# Patient Record
Sex: Female | Born: 1945
Health system: Southern US, Community
[De-identification: ages and names within clinical notes are randomized; demographics above are authoritative.]

## PROBLEM LIST (undated history)

## (undated) DIAGNOSIS — M1990 Unspecified osteoarthritis, unspecified site: Secondary | ICD-10-CM

## (undated) DIAGNOSIS — I10 Essential (primary) hypertension: Secondary | ICD-10-CM

## (undated) DIAGNOSIS — R112 Nausea with vomiting, unspecified: Secondary | ICD-10-CM

## (undated) DIAGNOSIS — I4891 Unspecified atrial fibrillation: Secondary | ICD-10-CM

## (undated) DIAGNOSIS — Z9889 Other specified postprocedural states: Secondary | ICD-10-CM

## (undated) HISTORY — DX: Essential (primary) hypertension: I10

## (undated) HISTORY — PX: JOINT REPLACEMENT: SHX530

---

## 2005-09-03 ENCOUNTER — Ambulatory Visit: Payer: Self-pay | Admitting: Family Medicine

## 2005-09-06 ENCOUNTER — Ambulatory Visit: Payer: Self-pay | Admitting: Family Medicine

## 2005-09-06 DIAGNOSIS — I10 Essential (primary) hypertension: Secondary | ICD-10-CM

## 2005-09-20 ENCOUNTER — Encounter: Admission: RE | Admit: 2005-09-20 | Discharge: 2005-09-20 | Payer: Self-pay | Admitting: Family Medicine

## 2005-10-07 ENCOUNTER — Ambulatory Visit: Payer: Self-pay | Admitting: Family Medicine

## 2005-11-08 ENCOUNTER — Ambulatory Visit: Payer: Self-pay | Admitting: Family Medicine

## 2006-02-05 ENCOUNTER — Ambulatory Visit: Payer: Self-pay | Admitting: Family Medicine

## 2006-05-28 ENCOUNTER — Ambulatory Visit: Payer: Self-pay | Admitting: Family Medicine

## 2006-05-28 LAB — CONVERTED CEMR LAB
ALT: 22 units/L (ref 0–40)
BUN: 12 mg/dL (ref 6–23)
Basophils Absolute: 0 10*3/uL (ref 0.0–0.1)
Basophils Relative: 1 % (ref 0.0–1.0)
Calcium: 9.3 mg/dL (ref 8.4–10.5)
Chloride: 107 meq/L (ref 96–112)
Cholesterol: 208 mg/dL (ref 0–200)
Creatinine, Ser: 0.8 mg/dL (ref 0.4–1.2)
HCT: 40.8 % (ref 36.0–46.0)
Hemoglobin: 14.1 g/dL (ref 12.0–15.0)
Lymphocytes Relative: 38.3 % (ref 12.0–46.0)
Monocytes Absolute: 0.4 10*3/uL (ref 0.2–0.7)
Monocytes Relative: 9.2 % (ref 3.0–11.0)
Neutro Abs: 1.9 10*3/uL (ref 1.4–7.7)
Neutrophils Relative %: 45.7 % (ref 43.0–77.0)
Platelets: 180 10*3/uL (ref 150–400)
RDW: 12.2 % (ref 11.5–14.6)
TSH: 0.97 microintl units/mL (ref 0.35–5.50)
Total Bilirubin: 1 mg/dL (ref 0.3–1.2)
Total CHOL/HDL Ratio: 4.7
Total Protein: 6.4 g/dL (ref 6.0–8.3)

## 2006-06-20 ENCOUNTER — Ambulatory Visit: Payer: Self-pay | Admitting: Family Medicine

## 2006-07-10 ENCOUNTER — Encounter: Payer: Self-pay | Admitting: Internal Medicine

## 2006-08-29 ENCOUNTER — Ambulatory Visit: Payer: Self-pay | Admitting: Internal Medicine

## 2006-08-29 DIAGNOSIS — L919 Hypertrophic disorder of the skin, unspecified: Secondary | ICD-10-CM

## 2006-08-29 DIAGNOSIS — L909 Atrophic disorder of skin, unspecified: Secondary | ICD-10-CM | POA: Insufficient documentation

## 2006-10-03 ENCOUNTER — Encounter: Admission: RE | Admit: 2006-10-03 | Discharge: 2006-10-03 | Payer: Self-pay | Admitting: Family Medicine

## 2006-12-05 ENCOUNTER — Ambulatory Visit: Payer: Self-pay | Admitting: Internal Medicine

## 2006-12-19 ENCOUNTER — Ambulatory Visit: Payer: Self-pay | Admitting: Internal Medicine

## 2006-12-19 ENCOUNTER — Encounter (INDEPENDENT_AMBULATORY_CARE_PROVIDER_SITE_OTHER): Payer: Self-pay | Admitting: Internal Medicine

## 2006-12-19 ENCOUNTER — Other Ambulatory Visit: Admission: RE | Admit: 2006-12-19 | Discharge: 2006-12-19 | Payer: Self-pay | Admitting: Family Medicine

## 2006-12-19 DIAGNOSIS — R7303 Prediabetes: Secondary | ICD-10-CM

## 2006-12-22 LAB — CONVERTED CEMR LAB
Direct LDL: 142.6 mg/dL
Glucose, Bld: 92 mg/dL (ref 70–99)
Total CHOL/HDL Ratio: 4.8
VLDL: 27 mg/dL (ref 0–40)

## 2006-12-25 ENCOUNTER — Ambulatory Visit: Payer: Self-pay | Admitting: Internal Medicine

## 2007-01-02 ENCOUNTER — Ambulatory Visit: Payer: Self-pay | Admitting: Family Medicine

## 2007-01-09 ENCOUNTER — Encounter (INDEPENDENT_AMBULATORY_CARE_PROVIDER_SITE_OTHER): Payer: Self-pay | Admitting: Internal Medicine

## 2007-01-09 ENCOUNTER — Ambulatory Visit: Payer: Self-pay | Admitting: Internal Medicine

## 2007-01-09 LAB — HM COLONOSCOPY: HM Colonoscopy: NORMAL

## 2007-06-19 ENCOUNTER — Ambulatory Visit: Payer: Self-pay | Admitting: Family Medicine

## 2007-06-26 LAB — CONVERTED CEMR LAB
CO2: 32 meq/L (ref 19–32)
Calcium: 9.3 mg/dL (ref 8.4–10.5)
Chloride: 107 meq/L (ref 96–112)
Creatinine, Ser: 0.9 mg/dL (ref 0.4–1.2)
GFR calc Af Amer: 82 mL/min
GFR calc non Af Amer: 68 mL/min
Glucose, Bld: 105 mg/dL — ABNORMAL HIGH (ref 70–99)
HDL: 44 mg/dL (ref >39.0)
LDL Cholesterol: 127 mg/dL — ABNORMAL HIGH (ref 0–99)
Potassium: 4.2 meq/L (ref 3.5–5.1)
Total CHOL/HDL Ratio: 4.5

## 2007-10-09 ENCOUNTER — Encounter: Admission: RE | Admit: 2007-10-09 | Discharge: 2007-10-09 | Payer: Self-pay | Admitting: Family Medicine

## 2007-10-13 ENCOUNTER — Encounter (INDEPENDENT_AMBULATORY_CARE_PROVIDER_SITE_OTHER): Payer: Self-pay | Admitting: *Deleted

## 2007-10-13 ENCOUNTER — Encounter (INDEPENDENT_AMBULATORY_CARE_PROVIDER_SITE_OTHER): Payer: Self-pay | Admitting: Internal Medicine

## 2007-12-23 ENCOUNTER — Ambulatory Visit: Payer: Self-pay | Admitting: Family Medicine

## 2008-02-08 ENCOUNTER — Ambulatory Visit: Payer: Self-pay | Admitting: Family Medicine

## 2008-02-08 DIAGNOSIS — E7800 Pure hypercholesterolemia, unspecified: Secondary | ICD-10-CM

## 2008-02-11 ENCOUNTER — Ambulatory Visit: Payer: Self-pay | Admitting: Family Medicine

## 2008-02-11 LAB — CONVERTED CEMR LAB
Cholesterol: 210 mg/dL (ref 0–200)
Direct LDL: 123.3 mg/dL
HDL: 42.9 mg/dL (ref >39.0)
VLDL: 25 mg/dL (ref 0–40)

## 2008-10-10 ENCOUNTER — Encounter: Admission: RE | Admit: 2008-10-10 | Discharge: 2008-10-10 | Payer: Self-pay | Admitting: Family Medicine

## 2008-10-11 ENCOUNTER — Encounter (INDEPENDENT_AMBULATORY_CARE_PROVIDER_SITE_OTHER): Payer: Self-pay | Admitting: Internal Medicine

## 2008-12-21 ENCOUNTER — Ambulatory Visit: Payer: Self-pay | Admitting: Family Medicine

## 2009-03-28 ENCOUNTER — Telehealth (INDEPENDENT_AMBULATORY_CARE_PROVIDER_SITE_OTHER): Payer: Self-pay | Admitting: Internal Medicine

## 2009-04-20 ENCOUNTER — Telehealth: Payer: Self-pay | Admitting: Family Medicine

## 2009-05-01 ENCOUNTER — Ambulatory Visit: Payer: Self-pay | Admitting: Family Medicine

## 2009-05-01 ENCOUNTER — Other Ambulatory Visit: Admission: RE | Admit: 2009-05-01 | Discharge: 2009-05-01 | Payer: Self-pay | Admitting: Family Medicine

## 2009-05-01 DIAGNOSIS — M25569 Pain in unspecified knee: Secondary | ICD-10-CM | POA: Insufficient documentation

## 2009-05-01 LAB — CONVERTED CEMR LAB
Alkaline Phosphatase: 64 units/L (ref 39–117)
BUN: 18 mg/dL (ref 6–23)
CO2: 30 meq/L (ref 19–32)
Calcium: 9.3 mg/dL (ref 8.4–10.5)
Cholesterol, target level: 200 mg/dL
Cholesterol: 188 mg/dL (ref 0–200)
Creatinine, Ser: 1 mg/dL (ref 0.4–1.2)
Glucose, Bld: 114 mg/dL — ABNORMAL HIGH (ref 70–99)
HDL goal, serum: 40 mg/dL
LDL Cholesterol: 106 mg/dL — ABNORMAL HIGH (ref 0–99)
Total Bilirubin: 0.9 mg/dL (ref 0.3–1.2)

## 2009-05-04 ENCOUNTER — Encounter: Payer: Self-pay | Admitting: Family Medicine

## 2009-05-04 ENCOUNTER — Encounter (INDEPENDENT_AMBULATORY_CARE_PROVIDER_SITE_OTHER): Payer: Self-pay | Admitting: *Deleted

## 2009-05-04 LAB — CONVERTED CEMR LAB: Pap Smear: NEGATIVE

## 2009-07-18 ENCOUNTER — Ambulatory Visit: Payer: Self-pay | Admitting: Family Medicine

## 2009-07-18 DIAGNOSIS — M5430 Sciatica, unspecified side: Secondary | ICD-10-CM | POA: Insufficient documentation

## 2009-10-13 ENCOUNTER — Encounter: Admission: RE | Admit: 2009-10-13 | Discharge: 2009-10-13 | Payer: Self-pay | Admitting: Family Medicine

## 2009-10-13 LAB — HM MAMMOGRAPHY

## 2009-12-13 ENCOUNTER — Ambulatory Visit: Payer: Self-pay | Admitting: Family Medicine

## 2010-04-24 NOTE — Progress Notes (Signed)
Summary: Altace 5mg  refill  Phone Note Refill Request Call back at [phone] Message from:  Patient on April 20, 2009 11:38 AM  Refills Requested: Medication #1:  ALTACE 5 MG CAPS Take 1 capsule by mouth once a day Pt request 10 pills until see Dr Dayton Martes on 05/01/09 at 8:30am. pt uses [facility].   Method Requested: Electronic Initial call taken by: Lewanda Rife LPN,  April 20, 2009 11:38 AM  Follow-up for Phone Call        Med was sent [facility] to [facility]. Patient notified as instructed by telephone. Lewanda Rife LPN  April 20, 2009 11:41 AM     Prescriptions: ALTACE 5 MG CAPS (RAMIPRIL) Take 1 capsule by mouth once a day  #10 x 0   Entered by:   Lewanda Rife LPN   Authorized by:   Shaune Leeks MD   Signed by:   Lewanda Rife LPN on 08/65/7846   Method used:   [facility]ically [facility]        [facility] (retail)       [address]       [place], [place]  [address]       Ph: [phone] or [phone]       Fax: [phone]   RxID:   [id number]

## 2010-04-24 NOTE — Consult Note (Signed)
Summary: [facility]   Imported By: Lanelle Bal 05/19/2009 08:59:30  _____________________________________________________________________  External Attachment:    Type:   Image     Comment:   External Document

## 2010-04-24 NOTE — Assessment & Plan Note (Signed)
Summary: CPX /RBH   Vital Signs:  Patient profile:   65 year old female Height:      63 inches Weight:      279 pounds BMI:     49.60 Temp:     98.1 degrees F oral Pulse rate:   92 / minute Pulse rhythm:   regular BP sitting:   166 / 82  (left arm) Cuff size:   large  Vitals Entered By: Delilah Shan CMA Duncan Dull) (May 01, 2009 8:18 AM)  Serial Vital Signs/Assessments:  Time      Position  BP       Pulse  Resp  Temp     By                     152/76                         Ruthe Mannan MD  CC: CPX, Lipid Management   History of Present Illness: 65 yo new to me here to establish care/CPX.  HTN- Has been controlled on Altace 5 mg adn HCTZ 25 mg.   Ran out of her Altace and a little nervous about today's appointment. Prior office visits show BPs in 1202-140s/70-80s.  No CP, SOB, LE edema.  Bilateral knee pain- has had knee pain with weight bearing for years. No redness, sometimes swells a little.  No injury, no LE weakness, never feels like her knees are going to give out on her.  Well woman- G3P3, no h/o abnormal pap smears. UTD on mammogram, colonoscopy. Never had zostavax. No personal or family h/o uterine, breast, or ovarian CA.  Lipid Management History:      Positive NCEP/ATP III risk factors include female age 64 years old or older and hypertension.  Negative NCEP/ATP III risk factors include non-tobacco-user status.     Current Medications (verified): 1)  Altace 5 Mg Caps (Ramipril) .... Take 1 Capsule By Mouth Once A Day 2)  Hydrochlorothiazide 25 Mg Tabs (Hydrochlorothiazide) .... 1/2 Tab Every Day 3)  Fish Oil 1000 Mg Caps (Omega-3 Fatty Acids) .... 2 Caps Every Day 4)  Calcium Carbonate-Vitamin D 600-400 Mg-Unit  Tabs (Calcium Carbonate-Vitamin D) .... Take 1 Tablet By Mouth Once A Day  Allergies (verified): No Known Drug Allergies  Review of Systems      See HPI General:  Denies weakness. CV:  Denies chest pain or discomfort, difficulty breathing at  night, difficulty breathing while lying down, leg cramps with exertion, lightheadness, near fainting, and palpitations. Resp:  Denies shortness of breath. GI:  Denies abdominal pain, bloody stools, and change in bowel habits. GU:  Denies discharge and dysuria. MS:  Complains of joint pain; denies joint redness, joint swelling, and muscle weakness. Derm:  Denies rash. Psych:  Denies anxiety and depression.  Physical Exam  General:  alert, well-developed, well-nourished, and well-hydrated.  morbidly obese  Eyes:  No corneal or conjunctival inflammation noted. EOMI. Perrla. Funduscopic exam benign, without hemorrhages, exudates or papilledema. Vision grossly normal. Ears:  External ear exam shows no significant lesions or deformities.  Otoscopic examination reveals clear canals, tympanic membranes are intact bilaterally without bulging, retraction, inflammation or discharge. Hearing is grossly normal bilaterally. Mouth:  Oral mucosa and oropharynx without lesions or exudates.  Teeth in good repair. Lungs:  normal respiratory effort, no intercostal retractions, no accessory muscle use, and normal breath sounds.   Heart:  normal rate, regular rhythm, and no murmur.  Genitalia:  Pelvic Exam:        External: normal female genitalia without lesions or masses        Vagina: normal without lesions or masses        Cervix: normal without lesions or masses        Adnexa: normal bimanual exam without masses or fullness        Uterus: normal by palpation        Pap smear: performed Msk:  no joint tenderness, no joint swelling, no joint warmth, and no redness over knees. Extremities:  no edema Skin:  turgor normal and color normal.   Psych:  normally interactive.     Impression & Recommendations:  Problem # 1:  WELL ADULT (ICD-V70.0) Reviewed preventive care protocols, scheduled due services, and updated immunizations Discussed nutrition, exercise, diet, and healthy lifestyle.  Zostavax, pap  today. BMET, FLP today.  Problem # 2:  SCREENING FOR MALIGNANT NEOPLASM OF THE CERVIX (ICD-V76.2)  Orders: Pap Smear, Thin Prep ( Collection of) (Z6109)  Problem # 3:  HYPERTENSION (ICD-401.9) Assessment: Deteriorated Likely due to running out of meds and anxiety from today's visit.  Will continue to have patient check at home and f/u with me. Her updated medication list for this problem includes:    Altace 5 Mg Caps (Ramipril) .Marland Kitchen... Take 1 capsule by mouth once a day    Hydrochlorothiazide 25 Mg Tabs (Hydrochlorothiazide) .Marland Kitchen... 1/2 tab every day  Orders: TLB-BMP (Basic Metabolic Panel-BMET) (80048-METABOL)  Problem # 4:  KNEE PAIN, BILATERAL (ICD-719.46) Assessment: New Has been ongoing for years, likely OA along with morbid obesity.  Will refer to ortho as I would not feel comfortable injecting her knees due to body habitius.  May need knee replacement, will continue to discuss weight loss. Orders: Orthopedic Referral (Ortho)  Complete Medication List: 1)  Altace 5 Mg Caps (Ramipril) .... Take 1 capsule by mouth once a day 2)  Hydrochlorothiazide 25 Mg Tabs (Hydrochlorothiazide) .... 1/2 tab every day 3)  Fish Oil 1000 Mg Caps (Omega-3 fatty acids) .... 2 caps every day 4)  Calcium Carbonate-vitamin D 600-400 Mg-unit Tabs (Calcium carbonate-vitamin d) .... Take 1 tablet by mouth once a day  Other Orders: Venipuncture ([address]) TLB-Hepatic/Liver Function Pnl (80076-HEPATIC) TLB-Lipid Panel (80061-LIPID)  Lipid Assessment/Plan:      Based on NCEP/ATP III, the patient's risk factor category is "2 or more risk factors and a calculated 10 year CAD risk of > 20%".  The patient's lipid goals are as follows: Total cholesterol goal is 200; LDL cholesterol goal is 130; HDL cholesterol goal is 40; Triglyceride goal is 150.    Patient Instructions: 1)  Nice to meet you, Ms Ebel. 2)  Please stop by and see Shirlee Limerick on your way out to set up your orthopedist referral. 3)  Come back to see  me after you see the orthopedist, we can discuss some weight loss strategies. Prescriptions: ALTACE 5 MG CAPS (RAMIPRIL) Take 1 capsule by mouth once a day  #30 x 11   Entered and Authorized by:   Ruthe Mannan MD   Signed by:   Ruthe Mannan MD on 05/01/2009   Method used:   [facility]        [facility] (retail)       [address]       [place], [place]  [address]       Ph: [phone] or [phone]       Fax: [phone]  RxID:   [id number] HYDROCHLOROTHIAZIDE 25 MG TABS (HYDROCHLOROTHIAZIDE) 1/2 tab every day  #30 x 11   Entered and Authorized by:   Ruthe Mannan MD   Signed by:   Ruthe Mannan MD on 05/01/2009   Method used:   [facility]        [id number]retail)       [address]       [place], [place]  [address]       Ph: [phone] or [phone]       Fax: [phone]   RxID:   [id number]   Current Allergies (reviewed today): No known allergies   Flex Sig Next Due:  Not Indicated Hemoccult Next Due:  Not Indicated   Appended Document: CPX /RBH   Immunizations Administered:  Zostavax # 1:    Vaccine Type: Zostavax    Site: Left arm    Mfr: Merck    Dose: 0.5 ml    Route: IM    Given by: Delilah Shan CMA (AAMA)    Exp. Date: 04/12/2010    Lot #: [id number]    VIS given: 01/04/05 given May 01, 2009.

## 2010-04-24 NOTE — Progress Notes (Signed)
Summary: Altace refill  Phone Note Refill Request Call back at [phone] Message from:  [facility] on March 28, 2009 9:18 AM  Refills Requested: Medication #1:  ALTACE 5 MG CAPS Take 1 capsule by mouth once a day [facility] request refill for Altace 5mg , pt has not been seen since 02/11/08. Pt cancelled appts on 04/03/08 and 04/28/08. Unable to reach pt by phone. No work or cell #. No answer at home #. Last refill note was attached to rx to call for appt before further refills could be given. Wanted to check with you to see if give one more refill with note to call for appt or deny refill. Please advise.    Method Requested: Electronic Initial call taken by: Lewanda Rife LPN,  March 28, 2009 9:20 AM  Follow-up for Phone Call        will refill with 15 pills only--needs to be seen during this period    Billie-Lynn Tyler Deis FNP  March 28, 2009 9:46 AM   [facility] as instructed. Unable to reach pt by phone. Left message with pharmacist that pt needs to be seen before the #15 pills are gone. Follow-up by: Lewanda Rife LPN,  March 28, 2009 10:37 AM    Prescriptions: ALTACE 5 MG CAPS (RAMIPRIL) Take 1 capsule by mouth once a day  #15 x 0   Entered and Authorized by:   Gildardo Griffes FNP   Signed by:   Gildardo Griffes FNP on 03/28/2009   Method used:   Telephoned to ...         RxID:   [id number]

## 2010-04-24 NOTE — Assessment & Plan Note (Signed)
Summary: FLU SHOT/CLE   Nurse Visit   Allergies: No Known Drug Allergies  Immunizations Administered:  Influenza Vaccine # 1:    Vaccine Type: Fluvax 3+    Site: left deltoid    Mfr: GlaxoSmithKline    Dose: 0.5 ml    Route: IM    Given by: DeShannon Smith CMA (AAMA)    Exp. Date: 09/22/2010    Lot #: [id number]    VIS given: 10/17/09 version given December 13, 2009.  Flu Vaccine Consent Questions:    Do you have a history of severe allergic reactions to this vaccine? no    Any prior history of allergic reactions to egg and/or gelatin? no    Do you have a sensitivity to the preservative Thimersol? no    Do you have a past history of Guillan-Barre Syndrome? no    Do you currently have an acute febrile illness? no    Have you ever had a severe reaction to latex? no    Vaccine information given and explained to patient? yes    Are you currently pregnant? no  Orders Added: 1)  Flu Vaccine 3yrs + [90658] 2)  Admin 1st Vaccine [90471] 

## 2010-04-24 NOTE — Letter (Signed)
Summary: Results Follow up Letter  [place] [facility]  [address] [place], [place] [address]   Phone: [phone]  Fax: [phone]    05/04/2009 MRN: [id number]    EMILYROSE DARRAH [address] [place], [place]  [address]    Dear Ms. Warshawsky,  The following are the results of your recent test(s):  Test         Result    Pap Smear:        Normal ___X__  Not Normal _____ Comments: ______________________________________________________ Cholesterol: LDL(Bad cholesterol):         Your goal is less than:         HDL (Good cholesterol):       Your goal is more than: Comments:  ______________________________________________________ Mammogram:        Normal _____  Not Normal _____ Comments:  ___________________________________________________________________ Hemoccult:        Normal _____  Not normal _______ Comments:    _____________________________________________________________________ Other Tests:    We routinely do not discuss normal results over the telephone.  If you desire a copy of the results, or you have any questions about this information we can discuss them at your next office visit.   Sincerely,    Ruthe Mannan,  M.D.  TA:lsf

## 2010-04-24 NOTE — Assessment & Plan Note (Signed)
Summary: PULLED MUSCLE IN BACK OF L LEG / LFW   Vital Signs:  Patient profile:   65 year old female Height:      63 inches Weight:      315.25 pounds BMI:     56.05 Temp:     98.3 degrees F oral Pulse rate:   84 / minute Pulse rhythm:   regular BP sitting:   172 / 84  (left arm) Cuff size:   large  Vitals Entered By: Delilah Shan CMA Duncan Dull) (July 18, 2009 11:30 AM) CC: Pulled muscle in back of leg   History of Present Illness: 65 yo with left lower back and leg pain x 1 week. Did not remember injuring her back in anyway. Pain starts left lower back, radiates to her knee. Describes it as a shooting, achy pain. No LE weakness.  Pain is the worst when she stands from a sitting position. No urinary symptoms or fevers.  Current Medications (verified): 1)  Altace 5 Mg Caps (Ramipril) .... Take 1 Capsule By Mouth Once A Day 2)  Hydrochlorothiazide 25 Mg Tabs (Hydrochlorothiazide) .... 1/2 Tab Every Day 3)  Fish Oil 1000 Mg Caps (Omega-3 Fatty Acids) .... 2 Caps Every Day 4)  Calcium Carbonate-Vitamin D 600-400 Mg-Unit  Tabs (Calcium Carbonate-Vitamin D) .... Take 1 Tablet By Mouth Once A Day 5)  Cyclobenzaprine Hcl 10 Mg  Tabs (Cyclobenzaprine Hcl) .Marland Kitchen.. 1 By Mouth 2 Times Daily As Needed For Back Pain 6)  Ibuprofen 800 Mg Tabs (Ibuprofen) .Marland Kitchen.. 1 Tab By Mouth Three Times A Day As Needed Pain  Allergies (verified): No Known Drug Allergies  Review of Systems      See HPI General:  Denies chills and fever. GI:  Denies abdominal pain. GU:  Denies dysuria, incontinence, urinary frequency, and urinary hesitancy. MS:  Complains of low back pain; denies muscle weakness.  Physical Exam  General:  alert, well-developed, well-nourished, and well-hydrated.  morbidly obese  Msk:  Left SLR pos. pos fabers. Exam limited due to body habitus, normal LE strength and reflexes. Psych:  normally interactive.     Impression & Recommendations:  Problem # 1:  SCIATICA  (ICD-724.3) Assessment New Will try flexeril and Ibuprofen. Pt to follow up in 6 weeks, sooner if symptoms worsen. Her updated medication list for this problem includes:    Cyclobenzaprine Hcl 10 Mg Tabs (Cyclobenzaprine hcl) .Marland Kitchen... 1 by mouth 2 times daily as needed for back pain    Ibuprofen 800 Mg Tabs (Ibuprofen) .Marland Kitchen... 1 tab by mouth three times a day as needed pain  Complete Medication List: 1)  Altace 5 Mg Caps (Ramipril) .... Take 1 capsule by mouth once a day 2)  Hydrochlorothiazide 25 Mg Tabs (Hydrochlorothiazide) .... 1/2 tab every day 3)  Fish Oil 1000 Mg Caps (Omega-3 fatty acids) .... 2 caps every day 4)  Calcium Carbonate-vitamin D 600-400 Mg-unit Tabs (Calcium carbonate-vitamin d) .... Take 1 tablet by mouth once a day 5)  Cyclobenzaprine Hcl 10 Mg Tabs (Cyclobenzaprine hcl) .Marland Kitchen.. 1 by mouth 2 times daily as needed for back pain 6)  Ibuprofen 800 Mg Tabs (Ibuprofen) .Marland Kitchen.. 1 tab by mouth three times a day as needed pain  Patient Instructions: 1)  Most patients (90%) with low back pain will improve with time ( 2-6 weeks). Keep active but avoid activities that are painful. Apply moist heat and/or ice to lower back several times a day.  2)  Flexeril and Ibuprofen as needed. Prescriptions: IBUPROFEN 800 MG TABS (  IBUPROFEN) 1 tab by mouth three times a day as needed pain  #90 x 0   Entered and Authorized by:   Ruthe Mannan MD   Signed by:   Ruthe Mannan MD on 07/18/2009   Method used:   [facility]        [facility]       [address]       [place], [place]  [address]       Ph: [phone] or [phone]       Fax: [phone]   RxID:   [id number] CYCLOBENZAPRINE HCL 10 MG  TABS (CYCLOBENZAPRINE HCL) 1 by mouth 2 times daily as needed for back pain  #60 x 0   Entered and Authorized by:   Ruthe Mannan MD   Signed by:   Ruthe Mannan MD on 07/18/2009   Method used:   [facility]        [facility] (retail)       [address]       [place], [place]  [address]       Ph: [phone] or [phone]       Fax: [phone]   RxID:   [id number]   Current Allergies (reviewed today): No known allergies

## 2010-06-26 ENCOUNTER — Other Ambulatory Visit: Payer: Self-pay | Admitting: Family Medicine

## 2010-08-18 ENCOUNTER — Encounter: Payer: Self-pay | Admitting: Family Medicine

## 2010-08-21 ENCOUNTER — Ambulatory Visit (INDEPENDENT_AMBULATORY_CARE_PROVIDER_SITE_OTHER): Payer: BC Managed Care – PPO | Admitting: Family Medicine

## 2010-08-21 ENCOUNTER — Encounter: Payer: Self-pay | Admitting: Family Medicine

## 2010-08-21 VITALS — BP 160/100 | HR 90 | Temp 98.5°F | Ht 62.0 in | Wt 256.8 lb

## 2010-08-21 DIAGNOSIS — R7309 Other abnormal glucose: Secondary | ICD-10-CM

## 2010-08-21 DIAGNOSIS — E7800 Pure hypercholesterolemia, unspecified: Secondary | ICD-10-CM

## 2010-08-21 DIAGNOSIS — Z0000 Encounter for general adult medical examination without abnormal findings: Secondary | ICD-10-CM | POA: Insufficient documentation

## 2010-08-21 DIAGNOSIS — I10 Essential (primary) hypertension: Secondary | ICD-10-CM

## 2010-08-21 LAB — BASIC METABOLIC PANEL
BUN: 17 mg/dL (ref 6–23)
GFR: 65.29 mL/min (ref >60.00)

## 2010-08-21 LAB — LIPID PANEL
Cholesterol: 200 mg/dL (ref 0–200)
LDL Cholesterol: 114 mg/dL — ABNORMAL HIGH (ref 0–99)
Triglycerides: 173 mg/dL — ABNORMAL HIGH (ref 0.0–149.0)

## 2010-08-21 LAB — HEMOGLOBIN A1C: Hgb A1c MFr Bld: 5.7 % (ref 4.6–6.5)

## 2010-08-21 MED ORDER — RAMIPRIL 5 MG PO CAPS: 5.0000 mg | ORAL_CAPSULE | Freq: Every day | ORAL | Status: DC

## 2010-08-21 NOTE — Assessment & Plan Note (Signed)
Deteriorated. Will refill Ramipril. Check BMET today.

## 2010-08-21 NOTE — Progress Notes (Signed)
65 yo new to me here for CPX.  HTN- Has been controlled on Altace 5 mg adn HCTZ 25 mg.   Ran out of her Altace and a little nervous about today's appointment.  No CP, SOB, LE edema.   Bilateral knee pain- has had knee pain with weight bearing for years. Has been seeing ortho, Dr. August Saucer. Getting knee injections which have been helping a great deal.    Well woman- G3P3, no h/o abnormal pap smears. UTD on mammogram, colonoscopy. Received Zostavax here last year.   No personal or family h/o uterine, breast, or ovarian CA.  The PMH, PSH, Social History, Family History, Medications, and allergies have been reviewed in [facility], and have been updated if relevant.   Review of Systems       See HPI General:  Denies weakness. CV:  Denies chest pain or discomfort, difficulty breathing at night, difficulty breathing while lying down, leg cramps with exertion, lightheadness, near fainting, and palpitations. Resp:  Denies shortness of breath. GI:  Denies abdominal pain, bloody stools, and change in bowel habits. GU:  Denies discharge and dysuria. MS:  Complains of joint pain; denies joint redness, joint swelling, and muscle weakness. Derm:  Denies rash. Psych:  Denies anxiety and depression.  Physical Exam BP 160/100  Pulse 90  Temp(Src) 98.5 F (36.9 C) (Oral)  Ht 5\' 2"  (1.575 m)  Wt 256 lb 12.8 oz (116.484 kg)  BMI 46.97 kg/m2  SpO2 97%  General:  alert, well-developed, well-nourished, and well-hydrated.  morbidly obese  Eyes:  No corneal or conjunctival inflammation noted. EOMI. Perrla. Funduscopic exam benign, without hemorrhages, exudates or papilledema. Vision grossly normal. Ears:  External ear exam shows no significant lesions or deformities.  Otoscopic examination reveals clear canals, tympanic membranes are intact bilaterally without bulging, retraction, inflammation or discharge. Hearing is grossly normal bilaterally. Mouth:  Oral mucosa and oropharynx without lesions or exudates.   Teeth in good repair. Lungs:  normal respiratory effort, no intercostal retractions, no accessory muscle use, and normal breath sounds.   Heart:  normal rate, regular rhythm, and no murmur.   Msk:  no joint tenderness, no joint swelling, no joint warmth, and no redness over knees. Extremities:  no edema Skin:  turgor normal and color normal.   Psych:  normally interactive.

## 2010-08-21 NOTE — Assessment & Plan Note (Signed)
Check FLP today. 

## 2010-08-21 NOTE — Assessment & Plan Note (Signed)
Reviewed preventive care protocols, scheduled due services, and updated immunizations Discussed nutrition, exercise, diet, and healthy lifestyle.  

## 2010-08-24 ENCOUNTER — Other Ambulatory Visit: Payer: Self-pay | Admitting: *Deleted

## 2010-08-24 MED ORDER — HYDROCHLOROTHIAZIDE 25 MG PO TABS: 25.0000 mg | ORAL_TABLET | Freq: Every day | ORAL | Status: DC

## 2010-08-28 ENCOUNTER — Encounter: Payer: Self-pay | Admitting: *Deleted

## 2010-08-28 ENCOUNTER — Other Ambulatory Visit: Payer: Self-pay | Admitting: Family Medicine

## 2010-08-28 ENCOUNTER — Other Ambulatory Visit: Payer: BC Managed Care – PPO

## 2010-08-28 DIAGNOSIS — Z1211 Encounter for screening for malignant neoplasm of colon: Secondary | ICD-10-CM

## 2010-08-28 LAB — FECAL OCCULT BLOOD, IMMUNOCHEMICAL: Fecal Occult Bld: NEGATIVE

## 2010-09-11 ENCOUNTER — Other Ambulatory Visit: Payer: Self-pay | Admitting: Family Medicine

## 2010-09-11 DIAGNOSIS — Z1231 Encounter for screening mammogram for malignant neoplasm of breast: Secondary | ICD-10-CM

## 2010-10-15 ENCOUNTER — Ambulatory Visit
Admission: RE | Admit: 2010-10-15 | Discharge: 2010-10-15 | Disposition: A | Discharge status: Home / Self Care | Payer: Federal, State, Local not specified - PPO | Source: Ambulatory Visit | Attending: Family Medicine | Admitting: Family Medicine

## 2010-10-15 DIAGNOSIS — Z1231 Encounter for screening mammogram for malignant neoplasm of breast: Secondary | ICD-10-CM

## 2010-10-16 ENCOUNTER — Encounter: Payer: Self-pay | Admitting: *Deleted

## 2010-12-19 ENCOUNTER — Ambulatory Visit (INDEPENDENT_AMBULATORY_CARE_PROVIDER_SITE_OTHER): Payer: Federal, State, Local not specified - PPO

## 2010-12-19 DIAGNOSIS — Z23 Encounter for immunization: Secondary | ICD-10-CM

## 2011-03-13 ENCOUNTER — Other Ambulatory Visit: Payer: Self-pay | Admitting: Family Medicine

## 2011-05-16 ENCOUNTER — Other Ambulatory Visit: Payer: Self-pay | Admitting: Family Medicine

## 2011-07-12 ENCOUNTER — Other Ambulatory Visit: Payer: Self-pay | Admitting: Internal Medicine

## 2011-07-12 ENCOUNTER — Other Ambulatory Visit: Payer: Self-pay | Admitting: *Deleted

## 2011-07-12 MED ORDER — HYDROCHLOROTHIAZIDE 25 MG PO TABS: 25.0000 mg | ORAL_TABLET | Freq: Every day | ORAL | Status: DC

## 2011-07-12 MED ORDER — RAMIPRIL 5 MG PO CAPS: 5.0000 mg | ORAL_CAPSULE | Freq: Every day | ORAL | Status: DC

## 2011-09-17 ENCOUNTER — Other Ambulatory Visit: Payer: Self-pay | Admitting: Family Medicine

## 2011-09-17 DIAGNOSIS — Z1231 Encounter for screening mammogram for malignant neoplasm of breast: Secondary | ICD-10-CM

## 2011-10-18 ENCOUNTER — Ambulatory Visit: Payer: Federal, State, Local not specified - PPO

## 2011-11-12 ENCOUNTER — Encounter: Payer: Self-pay | Admitting: Internal Medicine

## 2011-12-18 ENCOUNTER — Ambulatory Visit (INDEPENDENT_AMBULATORY_CARE_PROVIDER_SITE_OTHER): Payer: Federal, State, Local not specified - PPO

## 2011-12-18 DIAGNOSIS — Z23 Encounter for immunization: Secondary | ICD-10-CM

## 2012-01-20 ENCOUNTER — Other Ambulatory Visit: Payer: Self-pay

## 2012-01-20 MED ORDER — HYDROCHLOROTHIAZIDE 25 MG PO TABS: 25.0000 mg | ORAL_TABLET | Freq: Every day | ORAL | Status: DC

## 2012-01-20 NOTE — Telephone Encounter (Signed)
Ok to refill one month. Unfortunately needs to be seen for further refills.

## 2012-01-20 NOTE — Telephone Encounter (Signed)
Pt request refill HCTZ to [facility]; advised pt last seen 08/21/10 and pt will call back in 2 weeks to schedule appt; pt presently caring for her mother. Pt is out of med.Please advise.

## 2012-01-29 DIAGNOSIS — M1710 Unilateral primary osteoarthritis, unspecified knee: Secondary | ICD-10-CM | POA: Diagnosis not present

## 2012-02-05 ENCOUNTER — Ambulatory Visit (INDEPENDENT_AMBULATORY_CARE_PROVIDER_SITE_OTHER): Payer: Federal, State, Local not specified - PPO | Admitting: Family Medicine

## 2012-02-05 ENCOUNTER — Encounter: Payer: Self-pay | Admitting: Family Medicine

## 2012-02-05 VITALS — BP 140/70 | HR 80 | Temp 97.6°F | Wt 261.0 lb

## 2012-02-05 DIAGNOSIS — M25569 Pain in unspecified knee: Secondary | ICD-10-CM

## 2012-02-05 DIAGNOSIS — G589 Mononeuropathy, unspecified: Secondary | ICD-10-CM

## 2012-02-05 DIAGNOSIS — G629 Polyneuropathy, unspecified: Secondary | ICD-10-CM

## 2012-02-05 LAB — CBC WITH DIFFERENTIAL/PLATELET
Basophils Absolute: 0.1 10*3/uL (ref 0.0–0.1)
Basophils Relative: 0.8 % (ref 0.0–3.0)
Eosinophils Absolute: 0.2 10*3/uL (ref 0.0–0.7)
HCT: 46 % (ref 36.0–46.0)
Hemoglobin: 15.2 g/dL — ABNORMAL HIGH (ref 12.0–15.0)
MCHC: 33 g/dL (ref 30.0–36.0)
MCV: 84.3 fl (ref 78.0–100.0)
Monocytes Absolute: 0.8 10*3/uL (ref 0.1–1.0)
Monocytes Relative: 10.3 % (ref 3.0–12.0)
RDW: 13.8 % (ref 11.5–14.6)

## 2012-02-05 LAB — HEMOGLOBIN A1C: Hgb A1c MFr Bld: 5.8 % (ref 4.6–6.5)

## 2012-02-05 MED ORDER — GABAPENTIN 100 MG PO CAPS: ORAL_CAPSULE | ORAL | Status: DC

## 2012-02-05 NOTE — Progress Notes (Signed)
66 yo here for:   Bilateral knee pain- has had knee pain with weight bearing for years. Has been seeing ortho, Dr. August Saucer and receiving cortisone injections- just received them this week.   She does have end stage OA and is planning on having both knees replaced in the near future.  This has been helping but now she is having some burning and tingling in her feet.  She is not a diabetic. Lab Results  Component Value Date   HGBA1C 5.7 08/21/2010     Patient Active Problem List  Diagnosis  . PURE HYPERCHOLESTEROLEMIA  . OBESITY, MORBID  . HYPERTENSION  . SKIN TAG  . KNEE PAIN, BILATERAL  . SCIATICA  . HYPERGLYCEMIA  . Routine general medical examination at a health care facility   Past Medical History  Diagnosis Date  . Hypertension    No past surgical history on file. History  Substance Use Topics  . Smoking status: Never Smoker   . Smokeless tobacco: Not on file  . Alcohol Use: Not on file   No family history on file. No Known Allergies Current Outpatient Prescriptions on File Prior to Visit  Medication Sig Dispense Refill  . Calcium Carbonate-Vitamin D (CALCIUM 600+D) 600-400 MG-UNIT per tablet Take 1 tablet by mouth daily.        . cyclobenzaprine (FLEXERIL) 10 MG tablet Take one by mouth 2 times daily as needed for back pain       . hydrochlorothiazide (HYDRODIURIL) 25 MG tablet Take 1 tablet (25 mg total) by mouth daily.  30 tablet  0  . ibuprofen (ADVIL,MOTRIN) 800 MG tablet Take one tablet by mouth three times a day as needed for pain       . Omega-3 Fatty Acids (FISH OIL) 1000 MG CAPS Take 2 capsules daily       . ramipril (ALTACE) 5 MG capsule Take 1 capsule (5 mg total) by mouth daily.  90 capsule  2     The PMH, PSH, Social History, Family History, Medications, and allergies have been reviewed in [facility], and have been updated if relevant.   Review of Systems       See HPI General:  Denies weakness. CV:  Denies chest pain or discomfort, difficulty breathing  at night, difficulty breathing while lying down, leg cramps with exertion, lightheadness, near fainting, and palpitations. Resp:  Denies shortness of breath. GI:  Denies abdominal pain, bloody stools, and change in bowel habits. GU:  Denies discharge and dysuria. MS:  Complains of joint pain; denies joint redness, joint swelling, and muscle weakness. Derm:  Denies rash. Psych:  Denies anxiety and depression.  Physical Exam BP 140/70  Pulse 80  Temp 97.6 F (36.4 C)  Wt 261 lb (118.389 kg)  General:  alert, well-developed, well-nourished, and well-hydrated.  morbidly obese  Eyes:  No corneal or conjunctival inflammation noted. EOMI. Perrla. Funduscopic exam benign, without hemorrhages, exudates or papilledema. Vision grossly normal. Ears:  External ear exam shows no significant lesions or deformities.  Otoscopic examination reveals clear canals, tympanic membranes are intact bilaterally without bulging, retraction, inflammation or discharge. Hearing is grossly normal bilaterally. Mouth:  Oral mucosa and oropharynx without lesions or exudates.  Teeth in good repair. Lungs:  normal respiratory effort, no intercostal retractions, no accessory muscle use, and normal breath sounds.   Heart:  normal rate, regular rhythm, and no murmur.   Msk:  no joint tenderness, no joint swelling, no joint warmth, and no redness over knees. Extremities:  no edema Skin:  turgor normal and color normal.   Psych:  normally interactive.   Neuro:  Reflexes normal and symmetrical throughout  foot exam: Normal inspection No skin breakdown No calluses  Normal DP pulses Normal sensation to light touch and monofilament Nails normal   Assessment and Plan: 1. Neuropathy  New- has not had labs checked in over a year.  Will check labs today- including a1c, B12, CBC.  If all normal, likely related to her OA.  Start gabapentin- see pt instructions for details. Hemoglobin A1c, Vitamin B12, CBC with Differential  2.  KNEE PAIN, BILATERAL  Deteriorated.  End stage- awaiting bilateral Knee replacements.

## 2012-02-05 NOTE — Patient Instructions (Addendum)
Good to see you. We will call you with your lab work.  We are starting Gabapentin as directed.  Please call me in a month with an update.

## 2012-02-13 ENCOUNTER — Other Ambulatory Visit: Payer: Self-pay | Admitting: Family Medicine

## 2012-02-13 ENCOUNTER — Ambulatory Visit: Payer: Federal, State, Local not specified - PPO | Admitting: Family Medicine

## 2012-04-08 ENCOUNTER — Other Ambulatory Visit: Payer: Self-pay | Admitting: Internal Medicine

## 2012-05-27 ENCOUNTER — Other Ambulatory Visit: Payer: Self-pay | Admitting: Family Medicine

## 2012-06-03 DIAGNOSIS — M1710 Unilateral primary osteoarthritis, unspecified knee: Secondary | ICD-10-CM | POA: Diagnosis not present

## 2012-06-08 ENCOUNTER — Other Ambulatory Visit: Payer: Self-pay | Admitting: Orthopedic Surgery

## 2012-06-29 ENCOUNTER — Encounter (HOSPITAL_COMMUNITY): Payer: Self-pay

## 2012-06-29 ENCOUNTER — Encounter (HOSPITAL_COMMUNITY)
Admission: RE | Admit: 2012-06-29 | Discharge: 2012-06-29 | Disposition: A | Discharge status: Home / Self Care | Payer: Medicare Other | Source: Ambulatory Visit | Attending: Orthopedic Surgery | Admitting: Orthopedic Surgery

## 2012-06-29 DIAGNOSIS — Z01810 Encounter for preprocedural cardiovascular examination: Secondary | ICD-10-CM | POA: Insufficient documentation

## 2012-06-29 DIAGNOSIS — Z01818 Encounter for other preprocedural examination: Secondary | ICD-10-CM | POA: Diagnosis not present

## 2012-06-29 DIAGNOSIS — Z01812 Encounter for preprocedural laboratory examination: Secondary | ICD-10-CM | POA: Insufficient documentation

## 2012-06-29 DIAGNOSIS — I10 Essential (primary) hypertension: Secondary | ICD-10-CM | POA: Diagnosis not present

## 2012-06-29 HISTORY — DX: Unspecified osteoarthritis, unspecified site: M19.90

## 2012-06-29 LAB — BASIC METABOLIC PANEL
BUN: 23 mg/dL (ref 6–23)
CO2: 25 mEq/L (ref 19–32)
Calcium: 9.7 mg/dL (ref 8.4–10.5)
GFR calc non Af Amer: 72 mL/min — ABNORMAL LOW (ref >90)
Glucose, Bld: 110 mg/dL — ABNORMAL HIGH (ref 70–99)
Potassium: 3.3 mEq/L — ABNORMAL LOW (ref 3.5–5.1)

## 2012-06-29 LAB — URINALYSIS, ROUTINE W REFLEX MICROSCOPIC
Bilirubin Urine: NEGATIVE
Ketones, ur: NEGATIVE mg/dL
Nitrite: NEGATIVE
Protein, ur: NEGATIVE mg/dL

## 2012-06-29 LAB — CBC
Hemoglobin: 15.2 g/dL — ABNORMAL HIGH (ref 12.0–15.0)
MCH: 27.6 pg (ref 26.0–34.0)
MCHC: 34.9 g/dL (ref 30.0–36.0)
MCV: 79.1 fL (ref 78.0–100.0)
RBC: 5.51 MIL/uL — ABNORMAL HIGH (ref 3.87–5.11)

## 2012-06-29 LAB — TYPE AND SCREEN
ABO/RH(D): O POS
Antibody Screen: NEGATIVE

## 2012-06-29 LAB — URINE MICROSCOPIC-ADD ON

## 2012-06-29 LAB — PROTIME-INR: Prothrombin Time: 13.2 seconds (ref 11.6–15.2)

## 2012-06-29 NOTE — Pre-Procedure Instructions (Signed)
[name]  06/29/2012   Your procedure is scheduled on:  07/07/12  Report to [facility] at 530 AM.  Call this number if you have problems the morning of surgery: [phone]   Remember:   Do not eat food or drink liquids after midnight.   Take these medicines the morning of surgery with A SIP OF WATER: none   Do not wear jewelry, make-up or nail polish.  Do not wear lotions, powders, or perfumes. You may wear deodorant.  Do not shave 48 hours prior to surgery. Men may shave face and neck.  Do not bring valuables to the hospital.  Contacts, dentures or bridgework may not be worn into surgery.  Leave suitcase in the car. After surgery it may be brought to your room.  For patients admitted to the hospital, checkout time is 11:00 AM the day of  discharge.   Patients discharged the day of surgery will not be allowed to drive  home.  Name and phone number of your driver: family  Special Instructions: Shower using CHG 2 nights before surgery and the night before surgery.  If you shower the day of surgery use CHG.  Use special wash - you have one bottle of CHG for all showers.  You should use approximately 1/3 of the bottle for each shower.   Please read over the following fact sheets that you were given: Pain Booklet, Coughing and Deep Breathing, Blood Transfusion Information, MRSA Information and Surgical Site Infection Prevention

## 2012-06-29 NOTE — Progress Notes (Signed)
Will give lab results to [facility] for review.  K 3.3

## 2012-06-30 NOTE — Progress Notes (Signed)
Anesthesia chart review: Patient is a 67 year old female scheduled for right TKA by Dr. August Saucer on 07/07/2012. History includes nonsmoker, hypertension, arthritis, morbid obesity.  PCP is Dr. Ruthe Mannan who is aware of plans for future bilateral (staged) TKA.  Preoperative labs acceptable from an anesthesia standpoint. Copy of lab results faxed to Dr. Diamantina Providence office.   CXR on 06/29/12 showed no evidence of acute cardiopulmonary disease.  EKG on 06/29/12 showed SR with PACs, pulmonary disease pattern, LAFB, poor r wave progression.  Currently, there are no comparison EKGs available.  No CV symptoms were documented at her PAT visit.  She is morbidly obese, but no documented history of CAD/MI/CHF, smoking, or DM.  Her PCP is aware of plans for upcoming TKR and did not order any additional testing.  She will be evaluated by her assigned anesthesiologist on the day of surgery, but if she remains asymptomatic from a CV standpoint then would anticipate she could proceed as planned.  Velna Ochs [facility]/Anesthesiology Phone [phone] 06/30/2012 4:35 PM

## 2012-07-06 ENCOUNTER — Other Ambulatory Visit: Payer: Self-pay | Admitting: Family Medicine

## 2012-07-06 MED ORDER — DEXTROSE 5 % IV SOLN
3.0000 g | INTRAVENOUS | Status: AC
  Administered 2012-07-07: 3 g via INTRAVENOUS
  Filled 2012-07-06: qty 3000

## 2012-07-06 NOTE — H&P (Signed)
TOTAL KNEE ADMISSION H&P  Patient is being admitted for right total knee arthroplasty.  Subjective:  Chief Complaint:right knee pain.  HPI: [name], 67 y.o. female, has a history of pain and functional disability in the right knee due to arthritis and has failed non-surgical conservative treatments for greater than 12 weeks to includeNSAID's and/or analgesics, corticosteriod injections, viscosupplementation injections, weight reduction as appropriate and activity modification.  Onset of symptoms was gradual, starting 10 years ago with gradually worsening course since that time. The patient noted no past surgery on the bilaterally knee(s).  Patient currently rates pain in the right knee(s) at 7 out of 10 with activity. Patient has night pain, worsening of pain with activity and weight bearing, pain that interferes with activities of daily living, pain with passive range of motion, crepitus and joint swelling.  Patient has evidence of subchondral cysts, subchondral sclerosis, periarticular osteophytes, joint subluxation and joint space narrowing by imaging studies. This patient has had increase in pain despite all non op measures. There is no active infection.  Patient Active Problem List   Diagnosis Date Noted  . Routine general medical examination at a health care facility 08/21/2010  . SCIATICA 07/18/2009  . KNEE PAIN, BILATERAL 05/01/2009  . PURE HYPERCHOLESTEROLEMIA 02/08/2008  . HYPERGLYCEMIA 12/19/2006  . SKIN TAG 08/29/2006  . OBESITY, MORBID 07/10/2006  . HYPERTENSION 09/06/2005   Past Medical History  Diagnosis Date  . Hypertension   . Arthritis     Past Surgical History  Procedure Laterality Date  . Childbirth      3 children    No prescriptions prior to admission   No Known Allergies  History  Substance Use Topics  . Smoking status: Never Smoker   . Smokeless tobacco: Not on file  . Alcohol Use: No    No family history on file.   Review of Systems   Constitutional: Negative.   HENT: Negative.   Eyes: Negative.   Respiratory: Negative.   Cardiovascular: Negative.   Gastrointestinal: Negative.   Genitourinary: Negative.   Musculoskeletal: Positive for joint pain.  Skin: Negative.   Neurological: Negative.   Endo/Heme/Allergies: Negative.   Psychiatric/Behavioral: Negative.     Objective:  Physical Exam  Constitutional: She appears well-developed.  HENT:  Head: Normocephalic.  Eyes: Pupils are equal, round, and reactive to light.  Neck: Normal range of motion.  Cardiovascular: Normal rate.   Respiratory: Effort normal.  GI: Soft.  Neurological: She is alert.  Skin: Skin is warm.  Psychiatric: She has a normal mood and affect.  varus alignment Ble - dp 2/4 bilaterally - rom 15 to 80 - right knee - skin and extensor mechanism intact  Vital signs in last 24 hours:    Labs:   Estimated body mass index is 47.73 kg/(m^2) as calculated from the following:   Height as of 08/21/10: 5\' 2"  (1.575 m).   Weight as of 02/05/12: 118.389 kg (261 lb).   Imaging Review Plain radiographs demonstrate severe degenerative joint disease of the bilaterally knee(s). The overall alignment issignificant varus. The bone quality appears to be good for age and reported activity level.  Assessment/Plan:  End stage arthritis, bilaterally knee   The patient history, physical examination, clinical judgment of the provider and imaging studies are consistent with end stage degenerative joint disease of the bilaterally knee(s) and total knee arthroplasty is deemed medically necessary. The treatment options including medical management, injection therapy arthroscopy and arthroplasty were discussed at length. The risks and benefits of total  knee arthroplasty were presented and reviewed. The risks due to aseptic loosening, infection, stiffness, patella tracking problems, thromboembolic complications and other imponderables were discussed. The patient  acknowledged the explanation, agreed to proceed with the plan and consent was signed. Patient is being admitted for inpatient treatment for surgery, pain control, PT, OT, prophylactic antibiotics, VTE prophylaxis, progressive ambulation and ADL's and discharge planning. The patient is planning to be discharged home with home health services Due to her significant varus and potential bone loss medially revision tibial stemmed component will be used.

## 2012-07-07 ENCOUNTER — Encounter (HOSPITAL_COMMUNITY)
Admission: RE | Disposition: A | Discharge status: Home Health | Payer: Self-pay | Source: Ambulatory Visit | Attending: Orthopedic Surgery

## 2012-07-07 ENCOUNTER — Encounter (HOSPITAL_COMMUNITY): Payer: Self-pay | Admitting: *Deleted

## 2012-07-07 ENCOUNTER — Inpatient Hospital Stay (HOSPITAL_COMMUNITY)
Admission: RE | Admit: 2012-07-07 | Discharge: 2012-07-10 | DRG: 470 | Disposition: A | Discharge status: Home Health | Payer: Medicare Other | Source: Ambulatory Visit | Attending: Orthopedic Surgery | Admitting: Orthopedic Surgery

## 2012-07-07 ENCOUNTER — Ambulatory Visit (HOSPITAL_COMMUNITY): Payer: Medicare Other | Admitting: Anesthesiology

## 2012-07-07 ENCOUNTER — Encounter (HOSPITAL_COMMUNITY): Payer: Self-pay | Admitting: Vascular Surgery

## 2012-07-07 DIAGNOSIS — Z01818 Encounter for other preprocedural examination: Secondary | ICD-10-CM

## 2012-07-07 DIAGNOSIS — Z23 Encounter for immunization: Secondary | ICD-10-CM

## 2012-07-07 DIAGNOSIS — I10 Essential (primary) hypertension: Secondary | ICD-10-CM | POA: Diagnosis present

## 2012-07-07 DIAGNOSIS — Z01812 Encounter for preprocedural laboratory examination: Secondary | ICD-10-CM | POA: Diagnosis not present

## 2012-07-07 DIAGNOSIS — E7800 Pure hypercholesterolemia, unspecified: Secondary | ICD-10-CM | POA: Diagnosis present

## 2012-07-07 DIAGNOSIS — M25569 Pain in unspecified knee: Secondary | ICD-10-CM | POA: Diagnosis not present

## 2012-07-07 DIAGNOSIS — M1710 Unilateral primary osteoarthritis, unspecified knee: Secondary | ICD-10-CM | POA: Diagnosis not present

## 2012-07-07 DIAGNOSIS — Z01810 Encounter for preprocedural cardiovascular examination: Secondary | ICD-10-CM | POA: Diagnosis not present

## 2012-07-07 DIAGNOSIS — Z79899 Other long term (current) drug therapy: Secondary | ICD-10-CM | POA: Diagnosis not present

## 2012-07-07 DIAGNOSIS — Z6841 Body Mass Index (BMI) 40.0 and over, adult: Secondary | ICD-10-CM

## 2012-07-07 HISTORY — PX: TOTAL KNEE ARTHROPLASTY: SHX125

## 2012-07-07 HISTORY — DX: Nausea with vomiting, unspecified: R11.2

## 2012-07-07 HISTORY — DX: Other specified postprocedural states: Z98.890

## 2012-07-07 SURGERY — ARTHROPLASTY, KNEE, TOTAL
Anesthesia: General | Site: Knee | Laterality: Right | Wound class: Clean

## 2012-07-07 MED ORDER — WARFARIN VIDEO
Freq: Once | Status: AC
  Administered 2012-07-07: 20:00:00

## 2012-07-07 MED ORDER — METHOCARBAMOL 100 MG/ML IJ SOLN
500.0000 mg | Freq: Four times a day (QID) | INTRAVENOUS | Status: DC | PRN
  Filled 2012-07-07: qty 5

## 2012-07-07 MED ORDER — PROMETHAZINE HCL 25 MG/ML IJ SOLN
INTRAMUSCULAR | Status: AC
  Filled 2012-07-07: qty 1

## 2012-07-07 MED ORDER — MORPHINE SULFATE (PF) 1 MG/ML IV SOLN
INTRAVENOUS | Status: DC
  Administered 2012-07-07: 17 mg via INTRAVENOUS
  Administered 2012-07-07: 6 mg via INTRAVENOUS
  Administered 2012-07-07: 12:00:00 via INTRAVENOUS
  Administered 2012-07-08: 12 mg via INTRAVENOUS
  Filled 2012-07-07 (×2): qty 25

## 2012-07-07 MED ORDER — OXYCODONE HCL 5 MG/5ML PO SOLN: 5.0000 mg | Freq: Once | ORAL | Status: DC | PRN

## 2012-07-07 MED ORDER — CHLORHEXIDINE GLUCONATE 4 % EX LIQD: 60.0000 mL | Freq: Once | CUTANEOUS | Status: DC

## 2012-07-07 MED ORDER — ONDANSETRON HCL 4 MG/2ML IJ SOLN
INTRAMUSCULAR | Status: DC | PRN
  Administered 2012-07-07: 4 mg via INTRAVENOUS

## 2012-07-07 MED ORDER — BUPIVACAINE HCL (PF) 0.25 % IJ SOLN
INTRAMUSCULAR | Status: AC
  Filled 2012-07-07: qty 30

## 2012-07-07 MED ORDER — GABAPENTIN 100 MG PO CAPS
100.0000 mg | ORAL_CAPSULE | Freq: Three times a day (TID) | ORAL | Status: DC
  Administered 2012-07-07 – 2012-07-10 (×9): 100 mg via ORAL
  Filled 2012-07-07 (×12): qty 1

## 2012-07-07 MED ORDER — BUPIVACAINE HCL (PF) 0.25 % IJ SOLN
INTRAMUSCULAR | Status: DC | PRN
  Administered 2012-07-07: 20 mL

## 2012-07-07 MED ORDER — PNEUMOCOCCAL VAC POLYVALENT 25 MCG/0.5ML IJ INJ
0.5000 mL | INJECTION | INTRAMUSCULAR | Status: AC
  Administered 2012-07-08: 0.5 mL via INTRAMUSCULAR
  Filled 2012-07-07: qty 0.5

## 2012-07-07 MED ORDER — SODIUM CHLORIDE 0.9 % IR SOLN
Status: DC | PRN
  Administered 2012-07-07: 1000 mL

## 2012-07-07 MED ORDER — MORPHINE SULFATE (PF) 1 MG/ML IV SOLN
INTRAVENOUS | Status: AC
  Filled 2012-07-07: qty 25

## 2012-07-07 MED ORDER — NALOXONE HCL 0.4 MG/ML IJ SOLN: 0.4000 mg | INTRAMUSCULAR | Status: DC | PRN

## 2012-07-07 MED ORDER — MORPHINE SULFATE 4 MG/ML IJ SOLN
INTRAMUSCULAR | Status: AC
  Filled 2012-07-07: qty 2

## 2012-07-07 MED ORDER — HYDROMORPHONE HCL PF 1 MG/ML IJ SOLN: 0.2500 mg | INTRAMUSCULAR | Status: DC | PRN

## 2012-07-07 MED ORDER — METOCLOPRAMIDE HCL 10 MG PO TABS: 5.0000 mg | ORAL_TABLET | Freq: Three times a day (TID) | ORAL | Status: DC | PRN

## 2012-07-07 MED ORDER — LACTATED RINGERS IV SOLN
INTRAVENOUS | Status: DC | PRN
  Administered 2012-07-07 (×2): via INTRAVENOUS

## 2012-07-07 MED ORDER — DIPHENHYDRAMINE HCL 12.5 MG/5ML PO ELIX: 12.5000 mg | ORAL_SOLUTION | Freq: Four times a day (QID) | ORAL | Status: DC | PRN

## 2012-07-07 MED ORDER — METHOCARBAMOL 500 MG PO TABS
500.0000 mg | ORAL_TABLET | Freq: Four times a day (QID) | ORAL | Status: DC | PRN
  Administered 2012-07-09 – 2012-07-10 (×3): 500 mg via ORAL
  Filled 2012-07-07 (×3): qty 1

## 2012-07-07 MED ORDER — POTASSIUM CHLORIDE IN NACL 40-0.9 MEQ/L-% IV SOLN
INTRAVENOUS | Status: DC
  Administered 2012-07-08: 06:00:00 via INTRAVENOUS
  Filled 2012-07-07 (×2): qty 1000

## 2012-07-07 MED ORDER — ONDANSETRON HCL 4 MG/2ML IJ SOLN
4.0000 mg | Freq: Four times a day (QID) | INTRAMUSCULAR | Status: DC | PRN
  Administered 2012-07-07: 4 mg via INTRAVENOUS

## 2012-07-07 MED ORDER — OXYCODONE HCL 5 MG PO TABS: 5.0000 mg | ORAL_TABLET | Freq: Once | ORAL | Status: DC | PRN

## 2012-07-07 MED ORDER — METOCLOPRAMIDE HCL 5 MG/ML IJ SOLN
5.0000 mg | Freq: Three times a day (TID) | INTRAMUSCULAR | Status: DC | PRN
  Administered 2012-07-07: 10 mg via INTRAVENOUS
  Filled 2012-07-07: qty 2

## 2012-07-07 MED ORDER — HYDROMORPHONE HCL PF 1 MG/ML IJ SOLN
0.2500 mg | INTRAMUSCULAR | Status: DC | PRN
  Administered 2012-07-07: 0.5 mg via INTRAVENOUS

## 2012-07-07 MED ORDER — GLYCOPYRROLATE 0.2 MG/ML IJ SOLN
INTRAMUSCULAR | Status: DC | PRN
  Administered 2012-07-07: .6 mg via INTRAVENOUS

## 2012-07-07 MED ORDER — ACETAMINOPHEN 10 MG/ML IV SOLN
1000.0000 mg | Freq: Four times a day (QID) | INTRAVENOUS | Status: AC
  Administered 2012-07-08 (×2): 1000 mg via INTRAVENOUS
  Filled 2012-07-07 (×4): qty 100

## 2012-07-07 MED ORDER — 0.9 % SODIUM CHLORIDE (POUR BTL) OPTIME
TOPICAL | Status: DC | PRN
  Administered 2012-07-07 (×4): 1000 mL

## 2012-07-07 MED ORDER — CEFAZOLIN SODIUM-DEXTROSE 2-3 GM-% IV SOLR
2.0000 g | Freq: Four times a day (QID) | INTRAVENOUS | Status: AC
  Administered 2012-07-07 (×2): 2 g via INTRAVENOUS
  Filled 2012-07-07 (×3): qty 50

## 2012-07-07 MED ORDER — HYDROMORPHONE HCL PF 1 MG/ML IJ SOLN
INTRAMUSCULAR | Status: AC
  Filled 2012-07-07: qty 2

## 2012-07-07 MED ORDER — CALCIUM CARBONATE-VITAMIN D 500-200 MG-UNIT PO TABS
1.0000 | ORAL_TABLET | Freq: Every day | ORAL | Status: DC
  Administered 2012-07-07 – 2012-07-10 (×4): 1 via ORAL
  Filled 2012-07-07 (×5): qty 1

## 2012-07-07 MED ORDER — ONDANSETRON HCL 4 MG/2ML IJ SOLN
4.0000 mg | Freq: Four times a day (QID) | INTRAMUSCULAR | Status: DC | PRN
  Filled 2012-07-07: qty 2

## 2012-07-07 MED ORDER — ACETAMINOPHEN 325 MG PO TABS: 650.0000 mg | ORAL_TABLET | Freq: Four times a day (QID) | ORAL | Status: DC | PRN

## 2012-07-07 MED ORDER — MORPHINE SULFATE 4 MG/ML IJ SOLN
INTRAMUSCULAR | Status: DC | PRN
  Administered 2012-07-07: 8 mg

## 2012-07-07 MED ORDER — PROMETHAZINE HCL 25 MG/ML IJ SOLN
6.2500 mg | INTRAMUSCULAR | Status: DC | PRN
  Administered 2012-07-07: 12.5 mg via INTRAVENOUS

## 2012-07-07 MED ORDER — PROPOFOL 10 MG/ML IV BOLUS
INTRAVENOUS | Status: DC | PRN
  Administered 2012-07-07: 200 mg via INTRAVENOUS

## 2012-07-07 MED ORDER — ONDANSETRON HCL 4 MG/2ML IJ SOLN: 4.0000 mg | Freq: Once | INTRAMUSCULAR | Status: DC | PRN

## 2012-07-07 MED ORDER — OXYCODONE HCL 5 MG PO TABS
5.0000 mg | ORAL_TABLET | ORAL | Status: DC | PRN
  Administered 2012-07-08 – 2012-07-10 (×7): 10 mg via ORAL
  Filled 2012-07-07 (×7): qty 2

## 2012-07-07 MED ORDER — DIPHENHYDRAMINE HCL 50 MG/ML IJ SOLN: 12.5000 mg | Freq: Four times a day (QID) | INTRAMUSCULAR | Status: DC | PRN

## 2012-07-07 MED ORDER — WARFARIN SODIUM 10 MG PO TABS
10.0000 mg | ORAL_TABLET | Freq: Once | ORAL | Status: AC
  Administered 2012-07-07: 10 mg via ORAL
  Filled 2012-07-07: qty 1

## 2012-07-07 MED ORDER — LIDOCAINE HCL (CARDIAC) 20 MG/ML IV SOLN
INTRAVENOUS | Status: DC | PRN
  Administered 2012-07-07: 100 mg via INTRAVENOUS

## 2012-07-07 MED ORDER — ACETAMINOPHEN 10 MG/ML IV SOLN
INTRAVENOUS | Status: AC
  Administered 2012-07-07: 1000 mg via INTRAVENOUS
  Filled 2012-07-07: qty 100

## 2012-07-07 MED ORDER — MIDAZOLAM HCL 5 MG/5ML IJ SOLN
INTRAMUSCULAR | Status: DC | PRN
  Administered 2012-07-07: 2 mg via INTRAVENOUS

## 2012-07-07 MED ORDER — PHENOL 1.4 % MT LIQD: 1.0000 | OROMUCOSAL | Status: DC | PRN

## 2012-07-07 MED ORDER — MENTHOL 3 MG MT LOZG: 1.0000 | LOZENGE | OROMUCOSAL | Status: DC | PRN

## 2012-07-07 MED ORDER — CLONIDINE HCL (ANALGESIA) 100 MCG/ML EP SOLN
150.0000 ug | EPIDURAL | Status: DC
  Filled 2012-07-07: qty 1.5

## 2012-07-07 MED ORDER — FENTANYL CITRATE 0.05 MG/ML IJ SOLN
INTRAMUSCULAR | Status: DC | PRN
  Administered 2012-07-07 (×5): 50 ug via INTRAVENOUS
  Administered 2012-07-07: 100 ug via INTRAVENOUS
  Administered 2012-07-07 (×3): 50 ug via INTRAVENOUS

## 2012-07-07 MED ORDER — RAMIPRIL 5 MG PO CAPS
5.0000 mg | ORAL_CAPSULE | Freq: Every day | ORAL | Status: DC
  Administered 2012-07-07 – 2012-07-10 (×3): 5 mg via ORAL
  Filled 2012-07-07 (×5): qty 1

## 2012-07-07 MED ORDER — CLONIDINE HCL (ANALGESIA) 100 MCG/ML EP SOLN
EPIDURAL | Status: DC | PRN
  Administered 2012-07-07: 1 mL

## 2012-07-07 MED ORDER — ACETAMINOPHEN 650 MG RE SUPP: 650.0000 mg | Freq: Four times a day (QID) | RECTAL | Status: DC | PRN

## 2012-07-07 MED ORDER — HYDROCHLOROTHIAZIDE 25 MG PO TABS
25.0000 mg | ORAL_TABLET | Freq: Every day | ORAL | Status: DC
  Administered 2012-07-07 – 2012-07-10 (×3): 25 mg via ORAL
  Filled 2012-07-07 (×6): qty 1

## 2012-07-07 MED ORDER — CHLORHEXIDINE GLUCONATE 4 % EX LIQD
60.0000 mL | Freq: Once | CUTANEOUS | Status: DC
Start: 2012-07-07 — End: 2012-07-07

## 2012-07-07 MED ORDER — PROMETHAZINE HCL 25 MG/ML IJ SOLN: 12.5000 mg | Freq: Four times a day (QID) | INTRAMUSCULAR | Status: DC | PRN

## 2012-07-07 MED ORDER — ROCURONIUM BROMIDE 100 MG/10ML IV SOLN
INTRAVENOUS | Status: DC | PRN
  Administered 2012-07-07: 50 mg via INTRAVENOUS

## 2012-07-07 MED ORDER — NEOSTIGMINE METHYLSULFATE 1 MG/ML IJ SOLN
INTRAMUSCULAR | Status: DC | PRN
  Administered 2012-07-07: 4 mg via INTRAVENOUS

## 2012-07-07 MED ORDER — SODIUM CHLORIDE 0.9 % IJ SOLN: 9.0000 mL | INTRAMUSCULAR | Status: DC | PRN

## 2012-07-07 MED ORDER — RAMIPRIL 1.25 MG PO CAPS: 1.2500 mg | ORAL_CAPSULE | Freq: Every day | ORAL | Status: DC

## 2012-07-07 MED ORDER — ONDANSETRON HCL 4 MG PO TABS: 4.0000 mg | ORAL_TABLET | Freq: Four times a day (QID) | ORAL | Status: DC | PRN

## 2012-07-07 MED ORDER — PHENYLEPHRINE HCL 10 MG/ML IJ SOLN
INTRAMUSCULAR | Status: DC | PRN
  Administered 2012-07-07: 40 ug via INTRAVENOUS
  Administered 2012-07-07 (×2): 80 ug via INTRAVENOUS
  Administered 2012-07-07 (×2): 40 ug via INTRAVENOUS
  Administered 2012-07-07 (×2): 80 ug via INTRAVENOUS

## 2012-07-07 MED ORDER — PATIENT'S GUIDE TO USING COUMADIN BOOK
Freq: Once | Status: AC
  Administered 2012-07-07: 16:00:00
  Filled 2012-07-07: qty 1

## 2012-07-07 MED ORDER — WARFARIN - PHARMACIST DOSING INPATIENT
Freq: Every day | Status: DC
  Administered 2012-07-08: 18:00:00

## 2012-07-07 MED ORDER — BUPIVACAINE-EPINEPHRINE (PF) 0.5% -1:200000 IJ SOLN
INTRAMUSCULAR | Status: AC
  Filled 2012-07-07: qty 10

## 2012-07-07 SURGICAL SUPPLY — 78 items
BANDAGE ELASTIC 4 VELCRO ST LF (GAUZE/BANDAGES/DRESSINGS) ×2 IMPLANT
BANDAGE ELASTIC 6 VELCRO ST LF (GAUZE/BANDAGES/DRESSINGS) ×1 IMPLANT
BANDAGE ESMARK 6X9 LF (GAUZE/BANDAGES/DRESSINGS) ×1 IMPLANT
BLADE SAG 18X100X1.27 (BLADE) ×2 IMPLANT
BLADE SAW SGTL 13.0X1.19X90.0M (BLADE) ×2 IMPLANT
BNDG CMPR 9X6 STRL LF SNTH (GAUZE/BANDAGES/DRESSINGS) ×1
BNDG CMPR MED 10X6 ELC LF (GAUZE/BANDAGES/DRESSINGS) ×3
BNDG COHESIVE 6X5 TAN STRL LF (GAUZE/BANDAGES/DRESSINGS) ×2 IMPLANT
BNDG ELASTIC 6X10 VLCR STRL LF (GAUZE/BANDAGES/DRESSINGS) ×6 IMPLANT
BNDG ESMARK 6X9 LF (GAUZE/BANDAGES/DRESSINGS) ×2
BOWL SMART MIX CTS (DISPOSABLE) ×2 IMPLANT
CEMENT BONE SIMPLEX SPEEDSET (Cement) ×2 IMPLANT
CLOTH BEACON ORANGE TIMEOUT ST (SAFETY) ×2 IMPLANT
COVER SURGICAL LIGHT HANDLE (MISCELLANEOUS) ×2 IMPLANT
CUFF TOURNIQUET SINGLE 34IN LL (TOURNIQUET CUFF) IMPLANT
CUFF TOURNIQUET SINGLE 44IN (TOURNIQUET CUFF) ×1 IMPLANT
DRAPE INCISE IOBAN 66X45 STRL (DRAPES) ×2 IMPLANT
DRAPE ORTHO SPLIT 77X108 STRL (DRAPES) ×6
DRAPE SURG ORHT 6 SPLT 77X108 (DRAPES) ×3 IMPLANT
DRAPE U-SHAPE 47X51 STRL (DRAPES) ×2 IMPLANT
DRAPE X-RAY CASS 24X20 (DRAPES) IMPLANT
DRSG PAD ABDOMINAL 8X10 ST (GAUZE/BANDAGES/DRESSINGS) ×4 IMPLANT
DURAPREP 26ML APPLICATOR (WOUND CARE) ×2 IMPLANT
ELECT REM PT RETURN 9FT ADLT (ELECTROSURGICAL) ×2
ELECTRODE REM PT RTRN 9FT ADLT (ELECTROSURGICAL) ×1 IMPLANT
EVACUATOR 1/8 PVC DRAIN (DRAIN) ×2 IMPLANT
FACESHIELD LNG OPTICON STERILE (SAFETY) ×2 IMPLANT
GAUZE XEROFORM 5X9 LF (GAUZE/BANDAGES/DRESSINGS) ×2 IMPLANT
GLOVE BIOGEL PI IND STRL 7.5 (GLOVE) ×1 IMPLANT
GLOVE BIOGEL PI IND STRL 8 (GLOVE) ×1 IMPLANT
GLOVE BIOGEL PI INDICATOR 7.5 (GLOVE) ×1
GLOVE BIOGEL PI INDICATOR 8 (GLOVE) ×1
GLOVE ECLIPSE 7.0 STRL STRAW (GLOVE) ×4 IMPLANT
GLOVE SURG ORTHO 8.0 STRL STRW (GLOVE) ×2 IMPLANT
GOWN PREVENTION PLUS LG XLONG (DISPOSABLE) ×2 IMPLANT
GOWN PREVENTION PLUS XLARGE (GOWN DISPOSABLE) ×2 IMPLANT
GOWN STRL NON-REIN LRG LVL3 (GOWN DISPOSABLE) ×4 IMPLANT
HANDPIECE INTERPULSE COAX TIP (DISPOSABLE) ×2
HOOD PEEL AWAY FACE SHEILD DIS (HOOD) ×7 IMPLANT
IMMOBILIZER KNEE 20 (SOFTGOODS)
IMMOBILIZER KNEE 20 THIGH 36 (SOFTGOODS) IMPLANT
IMMOBILIZER KNEE 22 UNIV (SOFTGOODS) ×1 IMPLANT
IMMOBILIZER KNEE 24 THIGH 36 (MISCELLANEOUS) IMPLANT
IMMOBILIZER KNEE 24 UNIV (MISCELLANEOUS)
KIT BASIN OR (CUSTOM PROCEDURE TRAY) ×2 IMPLANT
KIT ROOM TURNOVER OR (KITS) ×2 IMPLANT
MANIFOLD NEPTUNE II (INSTRUMENTS) ×2 IMPLANT
MARKER SPHERE PSV REFLC THRD 5 (MARKER) IMPLANT
NDL 18GX1X1/2 (RX/OR ONLY) (NEEDLE) ×1 IMPLANT
NDL SPNL 18GX3.5 QUINCKE PK (NEEDLE) ×1 IMPLANT
NEEDLE 18GX1X1/2 (RX/OR ONLY) (NEEDLE) ×2 IMPLANT
NEEDLE SPNL 18GX3.5 QUINCKE PK (NEEDLE) ×2 IMPLANT
NS IRRIG 1000ML POUR BTL (IV SOLUTION) ×2 IMPLANT
PACK TOTAL JOINT (CUSTOM PROCEDURE TRAY) ×2 IMPLANT
PAD ARMBOARD 7.5X6 YLW CONV (MISCELLANEOUS) ×4 IMPLANT
PAD CAST 4YDX4 CTTN HI CHSV (CAST SUPPLIES) ×1 IMPLANT
PADDING CAST COTTON 4X4 STRL (CAST SUPPLIES) ×2
PADDING CAST COTTON 6X4 STRL (CAST SUPPLIES) ×4 IMPLANT
PIN SCHANZ 4MM 130MM (PIN) IMPLANT
RUBBERBAND STERILE (MISCELLANEOUS) ×2 IMPLANT
SET HNDPC FAN SPRY TIP SCT (DISPOSABLE) ×1 IMPLANT
SPONGE GAUZE 4X4 12PLY (GAUZE/BANDAGES/DRESSINGS) ×2 IMPLANT
SPONGE LAP 18X18 X RAY DECT (DISPOSABLE) ×3 IMPLANT
STAPLER VISISTAT 35W (STAPLE) ×2 IMPLANT
STEM CEMENTED (Stem) ×1 IMPLANT
SUCTION FRAZIER TIP 10 FR DISP (SUCTIONS) ×2 IMPLANT
SUT ETHILON 3 0 PS 1 (SUTURE) ×4 IMPLANT
SUT VIC AB 0 CTB1 27 (SUTURE) ×6 IMPLANT
SUT VIC AB 1 CT1 27 (SUTURE) ×10
SUT VIC AB 1 CT1 27XBRD ANBCTR (SUTURE) ×5 IMPLANT
SUT VIC AB 2-0 CT1 27 (SUTURE) ×4
SUT VIC AB 2-0 CT1 TAPERPNT 27 (SUTURE) ×2 IMPLANT
SYR 30ML SLIP (SYRINGE) ×2 IMPLANT
SYR TB 1ML LUER SLIP (SYRINGE) ×2 IMPLANT
TOWEL OR 17X24 6PK STRL BLUE (TOWEL DISPOSABLE) ×2 IMPLANT
TOWEL OR 17X26 10 PK STRL BLUE (TOWEL DISPOSABLE) ×4 IMPLANT
TRAY FOLEY CATH 14FR (SET/KITS/TRAYS/PACK) ×2 IMPLANT
WATER STERILE IRR 1000ML POUR (IV SOLUTION) ×3 IMPLANT

## 2012-07-07 NOTE — Anesthesia Procedure Notes (Signed)
Procedure Name: Intubation Date/Time: 07/07/2012 7:33 AM Performed by: Quentin Ore Pre-anesthesia Checklist: Patient identified, Emergency Drugs available, Suction available, Patient being monitored and Timeout performed Patient Re-evaluated:Patient Re-evaluated prior to inductionOxygen Delivery Method: Circle system utilized Preoxygenation: Pre-oxygenation with 100% oxygen Intubation Type: IV induction Ventilation: Mask ventilation without difficulty Grade View: Grade I Tube type: Oral Tube size: 7.0 mm Number of attempts: 1 Airway Equipment and Method: Stylet and Video-laryngoscopy Placement Confirmation: ETT inserted through vocal cords under direct vision,  positive ETCO2 and breath sounds checked- equal and bilateral Secured at: 22 cm Tube secured with: Tape Dental Injury: Teeth and Oropharynx as per pre-operative assessment

## 2012-07-07 NOTE — Anesthesia Preprocedure Evaluation (Addendum)
Anesthesia Evaluation  Patient identified by MRN, date of birth, ID band Patient awake    Reviewed: Allergy & Precautions, H&P , NPO status , Patient's Chart, lab work & pertinent test results  Airway Mallampati: III TM Distance: <3 FB Neck ROM: Full    Dental   Pulmonary   Few basilar rales, changed with cough        Cardiovascular hypertension, Rhythm:Regular Rate:Normal     Neuro/Psych    GI/Hepatic   Endo/Other  Morbid obesity  Renal/GU      Musculoskeletal   Abdominal (+) + obese,   Peds  Hematology   Anesthesia Other Findings   Reproductive/Obstetrics                          Anesthesia Physical Anesthesia Plan  ASA: III  Anesthesia Plan: General   Post-op Pain Management:    Induction: Intravenous  Airway Management Planned: Oral ETT and Video Laryngoscope Planned  Additional Equipment:   Intra-op Plan:   Post-operative Plan: Extubation in OR  Informed Consent: I have reviewed the patients History and Physical, chart, labs and discussed the procedure including the risks, benefits and alternatives for the proposed anesthesia with the patient or authorized representative who has indicated his/her understanding and acceptance.     Plan Discussed with: CRNA and Surgeon  Anesthesia Plan Comments:         Anesthesia Quick Evaluation

## 2012-07-07 NOTE — Preoperative (Signed)
Beta Blockers   Reason not to administer Beta Blockers:Not Applicable 

## 2012-07-07 NOTE — Anesthesia Postprocedure Evaluation (Signed)
  Anesthesia Post-op Note  Patient: [name]  Procedure(s) Performed: Procedure(s) with comments: TOTAL KNEE ARTHROPLASTY (Right) - Right Total Knee Arthroplasty  Patient Location: PACU  Anesthesia Type:General  Level of Consciousness: awake, oriented and patient cooperative  Airway and Oxygen Therapy: Patient Spontanous Breathing  Post-op Pain: moderate  Post-op Assessment: Post-op Vital signs reviewed, Patient's Cardiovascular Status Stable, Respiratory Function Stable, Patent Airway, No signs of Nausea or vomiting and Pain level controlled  Post-op Vital Signs: Reviewed and stable  Complications: No apparent anesthesia complications

## 2012-07-07 NOTE — Progress Notes (Signed)
Orthopedic Tech Progress Note Patient Details:  [name] April 19, 1945 [id number]  CPM Right Knee CPM Right Knee: On Right Knee Flexion (Degrees): 45 Right Knee Extension (Degrees): 0   Cammer, Mickie Bail 07/07/2012, 1:22 PM

## 2012-07-07 NOTE — Interval H&P Note (Signed)
History and Physical Interval Note:  07/07/2012 7:22 AM  [name]  has presented today for surgery, with the diagnosis of Right Knee Osteoarthritis  The various methods of treatment have been discussed with the patient and family. After consideration of risks, benefits and other options for treatment, the patient has consented to  Procedure(s) with comments: TOTAL KNEE ARTHROPLASTY (Right) - Right Total Knee Arthroplasty as a surgical intervention .  The patient's history has been reviewed, patient examined, no change in status, stable for surgery.  I have reviewed the patient's chart and labs.  Questions were answered to the patient's satisfaction.     DEAN,GREGORY SCOTT

## 2012-07-07 NOTE — Transfer of Care (Signed)
Immediate Anesthesia Transfer of Care Note  Patient: [name]  Procedure(s) Performed: Procedure(s) with comments: TOTAL KNEE ARTHROPLASTY (Right) - Right Total Knee Arthroplasty  Patient Location: PACU  Anesthesia Type:General  Level of Consciousness: awake, alert , oriented and patient cooperative  Airway & Oxygen Therapy: Patient Spontanous Breathing and Patient connected to face mask oxygen  Post-op Assessment: Report given to PACU RN and Post -op Vital signs reviewed and stable  Post vital signs: Reviewed and stable  Complications: No apparent anesthesia complications

## 2012-07-07 NOTE — Brief Op Note (Signed)
07/07/2012  11:19 AM  PATIENT:  [name]  67 y.o. female  PRE-OPERATIVE DIAGNOSIS:  Right Knee Osteoarthritis  POST-OPERATIVE DIAGNOSIS:  Right Knee Osteoarthritis  PROCEDURE:  Procedure(s): TOTAL KNEE ARTHROPLASTY  SURGEON:  Surgeon(s): Cammy Copa, MD  ASSISTANT: s vernon pa  ANESTHESIA:   general  EBL: 200 ml    Total I/O In: 1000 [I.V.:1000] Out: 400 [Urine:325; Blood:75]  BLOOD ADMINISTERED: none  DRAINS: (r) Hemovact drain(s) in the knee with  Suction Clamped   LOCAL MEDICATIONS USED:  none  SPECIMEN:  No Specimen  COUNTS:  YES  TOURNIQUET:   Total Tourniquet Time Documented: Thigh (Right) - 121 minutes Total: Thigh (Right) - 121 minutes   DICTATION: .Other Dictation: Dictation Number [id number]  PLAN OF CARE: Admit to inpatient   PATIENT DISPOSITION:  PACU - hemodynamically stable

## 2012-07-07 NOTE — Progress Notes (Signed)
Patient given coumadin notebook and explained the purpose of the medication. Patient verbalized understanding, but dozed off while explaining the medication. Will reinforce teaching of this new medication. Ciontinue plan of care.

## 2012-07-07 NOTE — Evaluation (Signed)
Physical Therapy Evaluation Patient Details Name: [name] MRN: [id number] DOB: July 18, 1945 Today's Date: 07/07/2012 Time: 4098-1191 PT Time Calculation (min): 54 min  PT Assessment / Plan / Recommendation Clinical Impression  Pt is a 67 y/o female s/p RTKA.  Acute PT to follow pt to progress mobility for safe d/c to home.     PT Assessment  Patient needs continued PT services    Follow Up Recommendations  Home health PT;Supervision - Intermittent    Does the patient have the potential to tolerate intense rehabilitation      Barriers to Discharge None      Equipment Recommendations  None recommended by PT    Recommendations for Other Services OT consult   Frequency 7X/week    Precautions / Restrictions Precautions Precautions: Knee Restrictions Weight Bearing Restrictions: Yes RLE Weight Bearing: Weight bearing as tolerated   Pertinent Vitals/Pain Pt nauseated throughout session. Vomited once during session.  Pt reporting 5/10 pain in knee. Pt has PCA for pain relief.       Mobility  Bed Mobility Bed Mobility: Supine to Sit;Sitting - Scoot to Edge of Bed Supine to Sit: 4: Min assist;HOB elevated;With rails Sitting - Scoot to Edge of Bed: 4: Min assist;With rail Details for Bed Mobility Assistance: Cues for technique, assist for RLE.  Transfers Transfers: Sit to Stand;Stand to [employer] Transfers Sit to Stand: 3: Mod assist;From bed;With upper extremity assist Stand to Sit: 4: Min assist;With upper extremity assist;To chair/3-in-1 Stand Pivot Transfers: 3: Mod assist Details for Transfer Assistance: Assist to raise body weight from the bed with cueing for hand placement.  Assist to control descent to recliner with vc for hand placement.  VCs for movement sequencing to stand pivot from bed to chair.  Ambulation/Gait Ambulation/Gait Assistance: Not tested (comment)    Exercises Total Joint Exercises Ankle Circles/Pumps: 10 reps;Both   PT Diagnosis:  Difficulty walking;Generalized weakness;Acute pain  PT Problem List: Decreased strength;Decreased range of motion;Decreased activity tolerance;Decreased mobility;Decreased knowledge of use of DME;Decreased knowledge of precautions;Obesity;Pain PT Treatment Interventions: DME instruction;Gait training;Stair training;Functional mobility training;Therapeutic activities;Therapeutic exercise;Neuromuscular re-education;Patient/family education   PT Goals Acute Rehab PT Goals PT Goal Formulation: With patient Time For Goal Achievement: 07/14/12 Potential to Achieve Goals: Good Pt will go Supine/Side to Sit: with modified independence PT Goal: Supine/Side to Sit - Progress: Goal set today Pt will go Sit to Supine/Side: with modified independence PT Goal: Sit to Supine/Side - Progress: Goal set today Pt will go Sit to Stand: with modified independence PT Goal: Sit to Stand - Progress: Goal set today Pt will go Stand to Sit: with modified independence PT Goal: Stand to Sit - Progress: Goal set today Pt will Transfer Bed to Chair/Chair to Bed: with modified independence PT Transfer Goal: Bed to Chair/Chair to Bed - Progress: Goal set today Pt will Ambulate: 51 - 150 feet;with supervision;with rolling walker PT Goal: Ambulate - Progress: Goal set today Pt will Go Up / Down Stairs: 1-2 stairs;with min assist;with rolling walker PT Goal: Up/Down Stairs - Progress: Goal set today Pt will Perform Home Exercise Program: with supervision, verbal cues required/provided PT Goal: Perform Home Exercise Program - Progress: Goal set today  Visit Information  Last PT Received On: 07/07/12    Subjective Data  Subjective: Agree to PT eval    Prior Functioning  Home Living Lives With: Spouse Available Help at Discharge: Family;Available 24 hours/day Type of Home: House Home Access: Stairs to enter [employer] of Steps: 2 Entrance Stairs-Rails: None  Home Layout: One level Bathroom Shower/Tub:  Walk-in shower;Door [employer] Toilet: Handicapped height Bathroom Accessibility: Yes How Accessible: Accessible via walker Home Adaptive Equipment: Straight cane;Walker - rolling Prior Function Level of Independence: Independent Able to Take Stairs?: Yes Driving: Yes Communication Communication: No difficulties Dominant Hand: Right    Cognition  Cognition Overall Cognitive Status: Appears within functional limits for tasks assessed/performed Arousal/Alertness: Awake/alert Orientation Level: Oriented X4 / Intact Behavior During Session: [facility] for tasks performed    Extremity/Trunk Assessment Right Upper Extremity Assessment RUE ROM/Strength/Tone: [facility] for tasks assessed Left Upper Extremity Assessment LUE ROM/Strength/Tone: WFL for tasks assessed Right Lower Extremity Assessment RLE ROM/Strength/Tone: Unable to fully assess;Due to pain Left Lower Extremity Assessment LLE ROM/Strength/Tone: WFL for tasks assessed Trunk Assessment Trunk Assessment: Normal   Balance Balance Balance Assessed: No  End of Session PT - End of Session Equipment Utilized During Treatment: Gait belt;Right knee immobilizer Activity Tolerance: Patient limited by fatigue;Patient limited by pain;Other (comment) (Nausea. ) Patient left: in chair;with call bell/phone within reach Nurse Communication: Mobility status CPM Right Knee CPM Right Knee: Off Right Knee Flexion (Degrees): 45 Right Knee Extension (Degrees): 0 Additional Comments: pt was in CPM but it was not running upon PT arrival.    GP     [name] 07/07/2012, 6:09 PM [name] L. Torell Minder DPT [phone]

## 2012-07-07 NOTE — Progress Notes (Signed)
Patient having multiple episodes of nausea and vomiting since returning from PACU. Zofran 4 g IVven at 1603by previous Charity fundraiser. Patient still vomiting small to moderate amounts. Reglan 10 g IV given at 1811. Will assess outcome if decreased vomiting episodes.

## 2012-07-07 NOTE — Progress Notes (Signed)
UR COMPLETED  

## 2012-07-07 NOTE — Progress Notes (Signed)
ANTICOAGULATION CONSULT NOTE - Initial Consult  Pharmacy Consult for Coumadin Indication: VTE prophylaxis  No Known Allergies  Medical History: Past Medical History  Diagnosis Date  . Hypertension   . Arthritis    Assessment: 67 year old female s/p TKA beginning Coumadin for VTE prophylaxis  Baseline INR = 1.01  Goal of Therapy:  INR 2-3 Monitor platelets by anticoagulation protocol: Yes   Plan:  1) Coumadin 10 mg po x 1 dose at 1800 pm 2) Daily INR 3) Coumadin education  Thank you. Okey Regal, PharmD [phone]  07/07/2012,3:01 PM

## 2012-07-08 ENCOUNTER — Encounter (HOSPITAL_COMMUNITY): Payer: Self-pay | Admitting: Orthopedic Surgery

## 2012-07-08 LAB — BASIC METABOLIC PANEL
CO2: 25 mEq/L (ref 19–32)
Calcium: 8.3 mg/dL — ABNORMAL LOW (ref 8.4–10.5)
Chloride: 100 mEq/L (ref 96–112)
GFR calc Af Amer: 33 mL/min — ABNORMAL LOW (ref >90)
Sodium: 135 mEq/L (ref 135–145)

## 2012-07-08 LAB — CBC
Platelets: 187 10*3/uL (ref 150–400)
RBC: 4.24 MIL/uL (ref 3.87–5.11)
WBC: 11 10*3/uL — ABNORMAL HIGH (ref 4.0–10.5)

## 2012-07-08 LAB — PROTIME-INR: Prothrombin Time: 15 seconds (ref 11.6–15.2)

## 2012-07-08 MED ORDER — WARFARIN SODIUM 10 MG PO TABS
10.0000 mg | ORAL_TABLET | Freq: Once | ORAL | Status: AC
  Administered 2012-07-08: 10 mg via ORAL
  Filled 2012-07-08: qty 1

## 2012-07-08 NOTE — Care Management Note (Signed)
CARE MANAGEMENT NOTE 07/08/2012  Patient:  [name], [name]   Account Number:  [id number]  Date Initiated:  07/08/2012  Documentation initiated by:  Vance Peper  Subjective/Objective Assessment:   67 yr old female s/p right total knee arthroplasty.     Action/Plan:   Patient preoperatively setup with [facility], no changes.   Anticipated DC Date:     Anticipated DC Plan:  HOME W HOME HEALTH SERVICES      DC Planning Services  CM consult      Choice offered to / List presented to:             Status of service:  In process, will continue to follow Medicare Important Message given?   (If response is "NO", the following Medicare IM given date fields will be blank) Date Medicare IM given:   Date Additional Medicare IM given:    Discharge Disposition:    Per UR Regulation:    If discussed at Long Length of Stay Meetings, dates discussed:    Comments:

## 2012-07-08 NOTE — Progress Notes (Signed)
Seen and agreed 07/08/2012 Rilley Stash Elizabeth PTA [phone] pager [phone] office    

## 2012-07-08 NOTE — Progress Notes (Signed)
Physical Therapy Treatment Patient Details Name: Ninel Abdella MRN: [id number] DOB: 1946-01-27 Today's Date: 07/08/2012 Time: 6295-2841 PT Time Calculation (min): 33 min  PT Assessment / Plan / Recommendation Comments on Treatment Session  Patient did excellent work on ther ex.  Patient needs to progress ambulation more.    Follow Up Recommendations  Home health PT;Supervision - Intermittent     Does the patient have the potential to tolerate intense rehabilitation     Barriers to Discharge        Equipment Recommendations  None recommended by PT    Recommendations for Other Services    Frequency     Plan Discharge plan needs to be updated;Frequency remains appropriate    Precautions / Restrictions Precautions Precautions: Knee Required Braces or Orthoses:  (Knee immobilizer without brace.) Restrictions Weight Bearing Restrictions: Yes RLE Weight Bearing: Weight bearing as tolerated   Pertinent Vitals/Pain 3/10 R Knee Pain    Mobility  Bed Mobility Supine to Sit: 4: Min assist;HOB elevated;With rails Sitting - Scoot to Edge of Bed: 4: Min guard Details for Bed Mobility Assistance: HOB elevated.  Attempt HOB at 0 degrees this afternoon. Transfers Transfers: Stand to Sit;Sit to Stand Sit to Stand: From bed;With upper extremity assist;4: Min assist Stand to Sit: 4: Min assist;With upper extremity assist;To chair/3-in-1;With armrests Details for Transfer Assistance: Cuing for hand placement.  Ambulation/Gait Ambulation/Gait Assistance: 4: Min guard Ambulation Distance (Feet): 3 Feet Assistive device: Rolling walker Ambulation/Gait Assistance Details: Needs someone to follow in chair.     Exercises Total Joint Exercises Quad Sets: AROM;Right;10 reps Heel Slides: AROM;Right;10 reps Straight Leg Raises: AAROM;10 reps;Right Long Arc Quad: AROM;Right;10 reps   PT Diagnosis:    PT Problem List:   PT Treatment Interventions:     PT Goals Acute Rehab PT Goals PT  Goal: Sit to Stand - Progress: Progressing toward goal PT Goal: Stand to Sit - Progress: Progressing toward goal PT Transfer Goal: Bed to Chair/Chair to Bed - Progress: Progressing toward goal PT Goal: Ambulate - Progress: Progressing toward goal PT Goal: Perform Home Exercise Program - Progress: Progressing toward goal  Visit Information  Last PT Received On: 07/08/12 Assistance Needed: +2    Subjective Data      Cognition  Cognition Arousal/Alertness: Awake/alert Behavior During Therapy: WFL for tasks assessed/performed Overall Cognitive Status: Within Functional Limits for tasks assessed    Balance     End of Session PT - End of Session Equipment Utilized During Treatment: Gait belt Activity Tolerance: Patient limited by fatigue;Patient limited by pain;Other (comment) Patient left: in chair;with call bell/phone within reach CPM Right Knee CPM Right Knee: On Right Knee Flexion (Degrees): 45 Right Knee Extension (Degrees): 0   GP     Phillippa Straub JEAN  SPTA 07/08/2012, 9:54 AM

## 2012-07-08 NOTE — Evaluation (Addendum)
Occupational Therapy Evaluation Patient Details Name: [name] MRN: [id number] DOB: Aug 21, 1945 Today's Date: 07/08/2012 Time: 7829-5621 OT Time Calculation (min): 35 min  OT Assessment / Plan / Recommendation Clinical Impression    Pt is a 67 y/o female s/p RTKA. Pt currently at Mod A for LB ADL's. Nurse notified about red place (pt reported it was sore) on buttocks.  Recommend HHOT upon d/c.      OT Assessment  Patient needs continued OT Services    Follow Up Recommendations  Home health OT;Supervision - Intermittent    Barriers to Discharge None    Equipment Recommendations  Other (comment) (grab bar beside toilet, and tub equipment TBD)    Recommendations for Other Services    Frequency  Min 2X/week    Precautions / Restrictions Precautions Precautions: Knee Precaution Booklet Issued: No Precaution Comments: Knee immobilizer doesn't have the metal part. Restrictions Weight Bearing Restrictions: Yes RLE Weight Bearing: Weight bearing as tolerated   Pertinent Vitals/Pain Pain 4/10 in knee. Repositioned.     ADL  Eating/Feeding: Independent Where Assessed - Eating/Feeding: Chair Grooming: Min guard Where Assessed - Grooming: Supported standing Upper Body Bathing: Set up;Supervision/safety Where Assessed - Upper Body Bathing: Unsupported sitting Lower Body Bathing: Moderate assistance Where Assessed - Lower Body Bathing: Supported sit to stand Upper Body Dressing: Supervision/safety;Set up Where Assessed - Upper Body Dressing: Unsupported sitting Lower Body Dressing: Moderate assistance Where Assessed - Lower Body Dressing: Supported sit to Pharmacist, hospital: Minimal assistance Toilet Transfer Method: Sit to Barista: Raised toilet seat with arms (or 3-in-1 over toilet) Toileting - Clothing Manipulation and Hygiene: Minimal assistance Where Assessed - Glass blower/designer Manipulation and Hygiene: Standing;Sit to stand from 3-in-1 or  toilet (clothing-standing (minA )and hygiene-sit to stand (minguard)) Tub/Shower Transfer Method: Not assessed Equipment Used: Rolling walker;Gait belt Transfers/Ambulation Related to ADLs: Minguard for ambulation ADL Comments:  Pt currently at Mod A level for LB ADLs (sit to stand). Will attempt to practice tomorrow as well as shower transfer.     OT Diagnosis: Acute pain  OT Problem List: Decreased strength;Decreased range of motion;Decreased activity tolerance;Impaired balance (sitting and/or standing);Decreased knowledge of use of DME or AE;Decreased knowledge of precautions;Pain OT Treatment Interventions: Self-care/ADL training;DME and/or AE instruction;Therapeutic activities;Patient/family education;Balance training   OT Goals Acute Rehab OT Goals OT Goal Formulation: With patient Time For Goal Achievement: 07/15/12 Potential to Achieve Goals: Good ADL Goals Pt Will Perform Lower Body Bathing: with supervision;Sit to stand from chair ADL Goal: Lower Body Bathing - Progress: Goal set today Pt Will Perform Lower Body Dressing: with supervision;Sit to stand from chair;Sit to stand from bed ADL Goal: Lower Body Dressing - Progress: Goal set today Pt Will Transfer to Toilet: with modified independence;with DME ADL Goal: Toilet Transfer - Progress: Goal set today Pt Will Perform Toileting - Clothing Manipulation: with supervision;Standing ADL Goal: Toileting - Clothing Manipulation - Progress: Goal set today Pt Will Perform Toileting - Hygiene: with supervision;Sit to stand from 3-in-1/toilet ADL Goal: Toileting - Hygiene - Progress: Goal set today Pt Will Perform Tub/Shower Transfer: Shower transfer;with supervision;with DME ADL Goal: Tub/Shower Transfer - Progress: Goal set today  Visit Information  Last OT Received On: 07/08/12 Assistance Needed: +2    Subjective Data      Prior Functioning     Home Living Lives With: Spouse Available Help at Discharge: Family;Available  24 hours/day Type of Home: House Home Access: Stairs to enter [employer] of Steps: 2 Entrance Stairs-Rails: None Home  Layout: One level Bathroom Shower/Tub: Walk-in shower;Door [employer] Toilet: Handicapped height Bathroom Accessibility: Yes How Accessible: Accessible via walker Home Adaptive Equipment: Straight cane;Walker - rolling Prior Function Level of Independence: Independent Able to Take Stairs?: Yes Driving: Yes Communication Communication: No difficulties Dominant Hand: Right         Vision/Perception     Cognition  Cognition Arousal/Alertness: Awake/alert Behavior During Therapy: WFL for tasks assessed/performed Overall Cognitive Status: Within Functional Limits for tasks assessed    Extremity/Trunk Assessment Right Upper Extremity Assessment RUE ROM/Strength/Tone: [facility] for tasks assessed Left Upper Extremity Assessment LUE ROM/Strength/Tone: WFL for tasks assessed     Mobility Bed Mobility Bed Mobility: Not assessed Transfers Transfers: Sit to Stand;Stand to Sit Sit to Stand: With upper extremity assist;4: Min assist;From chair/3-in-1;From toilet;With armrests Stand to Sit: 4: Min assist;With upper extremity assist;To chair/3-in-1;With armrests;To toilet Details for Transfer Assistance: Cuing for hand placement. Assistance to stand and cues to control decent to chair.               End of Session OT - End of Session Equipment Utilized During Treatment: Gait belt Activity Tolerance: Patient limited by fatigue; Patient limited by pain Patient left: in chair;with call bell/phone within reach Nurse Communication: Other (comment) (Red place on pt's  bottom and to place in CPM)   GO     Earlie Raveling OTR/L [phone] 07/08/2012, 4:00 PM

## 2012-07-08 NOTE — Progress Notes (Signed)
Physical Therapy Treatment Patient Details Name: Rosaisela Jamroz MRN: [id number] DOB: May 27, 1945 Today's Date: 07/08/2012 Time: 7829-5621 PT Time Calculation (min): 30 min  PT Assessment / Plan / Recommendation Comments on Treatment Session  Patient needs to progress ambulation and attempt stair training.    Follow Up Recommendations  Home health PT;Supervision - Intermittent     Does the patient have the potential to tolerate intense rehabilitation     Barriers to Discharge        Equipment Recommendations  None recommended by PT    Recommendations for Other Services    Frequency 7X/week   Plan Discharge plan needs to be updated;Frequency remains appropriate    Precautions / Restrictions Precautions Precautions: Knee Precaution Booklet Issued: No Precaution Comments: Knee immobilizer doesn't have the metal part. Restrictions Weight Bearing Restrictions: Yes RLE Weight Bearing: Weight bearing as tolerated   Pertinent Vitals/Pain 4/10 R Knee pain.    Mobility  Transfers Transfers: Sit to Stand;Stand to Sit Sit to Stand: With upper extremity assist;4: Min assist;From chair/3-in-1;From toilet;With armrests Stand to Sit: 4: Min assist;With upper extremity assist;To chair/3-in-1;With armrests;To toilet Details for Transfer Assistance: Cuing for hand placement. Assistance to stand. Ambulation/Gait Ambulation/Gait Assistance: 4: Min guard Ambulation Distance (Feet): 20 Feet Assistive device: Rolling walker Ambulation/Gait Assistance Details: Needs someone to follow with chair. Gait Pattern: Step-to pattern;Decreased step length - right;Decreased step length - left;Decreased stance time - right;Antalgic;Trunk flexed Gait velocity: Decreased. Stairs: No    Exercises     PT Diagnosis:    PT Problem List:   PT Treatment Interventions:     PT Goals Acute Rehab PT Goals PT Goal: Sit to Stand - Progress: Progressing toward goal PT Goal: Stand to Sit - Progress: Progressing  toward goal PT Goal: Ambulate - Progress: Progressing toward goal PT Goal: Perform Home Exercise Program - Progress: Progressing toward goal  Visit Information  Last PT Received On: 07/08/12 Assistance Needed: +2    Subjective Data      Cognition  Cognition Arousal/Alertness: Awake/alert Behavior During Therapy: WFL for tasks assessed/performed Overall Cognitive Status: Within Functional Limits for tasks assessed    Balance     End of Session PT - End of Session Equipment Utilized During Treatment: Gait belt Activity Tolerance: Patient limited by fatigue;Patient limited by pain;Other (comment) Patient left:  (Patient left with OT present.)   GP     Maximino Greenland JEAN 07/08/2012, 3:30 PM

## 2012-07-08 NOTE — Progress Notes (Signed)
Subjective: Pt stable - tol oob yesterday   Objective: Vital signs in last 24 hours: Temp:  [97.4 F (36.3 C)-98.8 F (37.1 C)] 98.3 F (36.8 C) (04/16 0627) Pulse Rate:  [58-82] 61 (04/16 0627) Resp:  [12-18] 16 (04/16 0627) BP: (86-139)/(44-69) 92/51 mmHg (04/16 0627) SpO2:  [94 %-100 %] 98 % (04/16 0627)  Intake/Output from previous day: 04/15 0701 - 04/16 0700 In: 2500 [P.O.:600; I.V.:1900] Out: 1062 [Urine:975; Emesis/NG output:2; Drains:10; Blood:75] Intake/Output this shift:    Exam:  Sensation intact distally Intact pulses distally Dorsiflexion/Plantar flexion intact Compartment soft  Labs:  Recent Labs  07/08/12 0558  HGB 11.4*    Recent Labs  07/08/12 0558  WBC 11.0*  RBC 4.24  HCT 34.5*  PLT 187   No results found for this basename: NA, K, CL, CO2, BUN, CREATININE, GLUCOSE, CALCIUM,  in the last 72 hours  Recent Labs  07/08/12 0558  INR 1.20    Assessment/Plan: PT and CPM today - dc pca - start pain meds   DEAN,GREGORY SCOTT 07/08/2012, 7:13 AM

## 2012-07-08 NOTE — Progress Notes (Signed)
PT had n/v with emesis last night at 2230. Pt took a pill with water and immediately had nausea with moderate amount of green gastric fluid emesis. Per report from day nurse pt had n/v during the day with vomiting x4 of the same gastric fluid emesis. No complaints of abdominal pain, and pt had relief after vomiting and required no antiemetics during the night. MD on call notified last night of event. Pt currently comfortable with no complaints of n/v this morning. Clear diet tray ordered for breakfast. Will continue to monitor pt. Elly Modena

## 2012-07-08 NOTE — Progress Notes (Signed)
ANTICOAGULATION CONSULT NOTE Pharmacy Consult for Coumadin Indication: VTE prophylaxis  No Known Allergies  Medical History: Past Medical History  Diagnosis Date  . Hypertension   . PONV (postoperative nausea and vomiting)   . Arthritis     "both knees" (07/07/2012)   Assessment: 67 year old female s/p TKA beginning Coumadin for VTE prophylaxis  Baseline INR = 1.01, INR today = 1.20  Goal of Therapy:  INR 2-3 Monitor platelets by anticoagulation protocol: Yes   Plan:  1) Repeat Coumadin 10 mg po x 1 dose at 1800 pm 2) Daily INR 3) Coumadin education  Thank you. Okey Regal, PharmD [phone]  07/08/2012,8:49 AM

## 2012-07-08 NOTE — Op Note (Signed)
NAMELAQUASIA, PINCUS NO.:  [id number]  MEDICAL RECORD NO.:  [id number]  LOCATION:  5N05C                        FACILITY:  [facility]  PHYSICIAN:  Burnard Bunting, M.D.    DATE OF BIRTH:  [birth year]  DATE OF PROCEDURE: DATE OF DISCHARGE:                              OPERATIVE REPORT   PREOPERATIVE DIAGNOSIS:  Right knee arthritis with varus and flexion deformity.  POSTOPERATIVE DIAGNOSIS:  Right knee arthritis with varus and flexion deformity.  PROCEDURE:  Right total knee replacement using Stryker triathlon knee, size 6 femur, size 5 tibia, 9-mm polyethylene insert, 32 mm 3-peg cemented patella posterior cruciate sacrificing.  SURGEON:  Burnard Bunting, M.D.  ASSISTANT:  Maud Deed.  ANESTHESIA:  General endotracheal.  ESTIMATED BLOOD LOSS:  200.  DRAINS:  Hemovac x1.  TOURNIQUET TIME:  2 hours at 300 mmHg.  INDICATIONS:  [name] is a patient with end-stage right knee arthritis presents for operative management, after explanation of risks and benefits.  PROCEDURE IN DETAIL:  The patient was brought into the operating room where general endotracheal anesthesia.  Preop antibiotics administered. Time-out was called.  Right leg was prescrubbed with alcohol, Betadine, after shaving and prepped with DuraPrep solution and draped in sterile manner.  [name] was used to cover the operative field.  Leg was elevated and exsanguinated with an Esmarch wrap.  Tourniquet was inflated. Anterior approach to knee was made.  Skin and subcutaneous tissue sharply divided.  Median parapatellar approach was made and marked with #1 Vicryl suture.  The soft tissue elevated off the anterior distal femur.  Lateral patellofemoral ligament was released.  Fat pad partially excised because of the patient's preop flexion contracture, 15 degrees minimal as well as varus deformity.  Soft tissue releases were performed, including the superficial MCL of the tibia.  The  posterior oblique ligament and semimembranosus.  These were not all done initially but were required at the various points in order to achieve balance. The ACL was released.  Osteophytes were removed.  PCL also released off the posterior tibia.  Care was taken to avoid injury to posterior neurovascular structures.  Tibia cut was then made in toto and sequential cuts with 12 mm off the least affected lateral side.  This did require 2 resections.  The patient had a significant defect posterior medial tibial plateau which the resection did get down to. For this reason, the stent implant was used.  Cut was then made off the femur initially 10 mm later required additional 2 mm resection.  Box and chamfer cuts were made.  Osteophytes were removed posteriorly and posterior release of the capsule was also performed using a round and Cobb elevator.  At this time, we were able to fit 9 spacer block in both flexion and extension, with good alignment observe.  At this time, trial components were placed with a 5 tibia followed by 6 femur, 9 poly insert.  The patella was cut from 22-14 and replaced with a 32-mm 3-peg patella.  The trial components in position, the patient had full extension, good stability to varus-valgus stress at 0, 30, and 90 degrees.  Excellent patellar tracking with no thumbs technique.  Trial components were removed.  True tourniquet was released at this time after 2 hours.  Thorough irrigation was performed with 3 L of irrigating solution.  Bleeding points were controlled with electrocautery.  True components were then cemented into position with excess cement removed. Again, a stemmed tibial component was utilized 100 mm stem which was not cemented, thus for added varus-valgus stability.  Bone quality was good on the medial tibial plateau, medial femoral condyle.  Holes were drilled for cement penetration.  The femur was in place patella and spacer was placed with the same  stability parameters maintained. Thorough irrigation again performed.  Hemovac drain placed.  The closed over bolster using #1 Vicryl suture, 0 Vicryl suture and skin staples. Solution of Marcaine, morphine, clonidine injected to the knee.  Bulky dressing and knee immobilizer placed.  Velna Hatchet Vernon's assistance required at all times during the case for retraction, opening and closing, retraction of neurovascular structures, limb positioning, cement removal, her assistance was a medical necessity.     Burnard Bunting, M.D.     GSD/MEDQ  D:  07/07/2012  T:  07/08/2012  Job:  [id number]

## 2012-07-09 LAB — CBC
Hemoglobin: 10.4 g/dL — ABNORMAL LOW (ref 12.0–15.0)
MCHC: 35.4 g/dL (ref 30.0–36.0)
Platelets: 127 10*3/uL — ABNORMAL LOW (ref 150–400)
RBC: 3.79 MIL/uL — ABNORMAL LOW (ref 3.87–5.11)

## 2012-07-09 LAB — PROTIME-INR
INR: 1.85 — ABNORMAL HIGH (ref 0.00–1.49)
Prothrombin Time: 20.7 seconds — ABNORMAL HIGH (ref 11.6–15.2)

## 2012-07-09 MED ORDER — WARFARIN SODIUM 5 MG PO TABS
5.0000 mg | ORAL_TABLET | Freq: Once | ORAL | Status: AC
  Administered 2012-07-09: 5 mg via ORAL
  Filled 2012-07-09: qty 1

## 2012-07-09 NOTE — Care Management Note (Signed)
CARE MANAGEMENT NOTE 07/09/2012  Patient:  [name], [name]   Account Number:  [id number]  Date Initiated:  07/08/2012  Documentation initiated by:  Vance Peper  Subjective/Objective Assessment:   67 yr old female s/p right total knee arthroplasty.     Action/Plan:   CM spoke with Patient and husband ,preoperatively setup with [facility], no changes. Patient has DME and CPM .   Anticipated DC Date:  07/10/2012   Anticipated DC Plan:  HOME W HOME HEALTH SERVICES      DC Planning Services  CM consult      [facility] Choice  HOME HEALTH   Choice offered to / List presented to:  C-1 Patient        HH arranged  HH-2 PT  HH-1 RN      [facility] agency  [employer]   Status of service:  Completed, signed off Medicare Important Message given?   (If response is "NO", the following Medicare IM given date fields will be blank) Date Medicare IM given:   Date Additional Medicare IM given:    Discharge Disposition:  HOME W HOME HEALTH SERVICES  Per UR Regulation:    If discussed at Long Length of Stay Meetings, dates discussed:    Comments:

## 2012-07-09 NOTE — Progress Notes (Signed)
Physical Therapy Treatment Patient Details Name: [name] MRN: [id number] DOB: November 06, 1945 Today's Date: 07/09/2012 Time: 7829-5621 PT Time Calculation (min): 24 min  PT Assessment / Plan / Recommendation Comments on Treatment Session  Pt.'s husband present and was educated, along with pt. on safe technique in assisting pt. up and down 2 steps.  Pt.'s husband and son to be here at 9am tomorrow and will need to practice again for safe transfer into her home    Follow Up Recommendations  Home health PT;Supervision/Assistance - 24 hour;Supervision for mobility/OOB     Does the patient have the potential to tolerate intense rehabilitation     Barriers to Discharge        Equipment Recommendations  None recommended by PT    Recommendations for Other Services    Frequency 7X/week   Plan Discharge plan remains appropriate;Frequency remains appropriate    Precautions / Restrictions Precautions Precautions: Knee Required Braces or Orthoses: Knee Immobilizer - Right Knee Immobilizer - Right: Other (comment) (not specified in orderds, presumed when OOB) Restrictions Weight Bearing Restrictions: Yes (Simultaneous filing. User may not have seen previous data.) RLE Weight Bearing: Weight bearing as tolerated (Simultaneous filing. User may not have seen previous data.)   Pertinent Vitals/Pain See vitals tab    Mobility  Bed Mobility Bed Mobility: Not assessed Transfers Transfers: Sit to Stand;Stand to Sit Sit to Stand: 4: Min guard;With upper extremity assist;From chair/3-in-1;Other (comment) Stand to Sit: 4: Min guard;With upper extremity assist;To chair/3-in-1;Other (comment) Details for Transfer Assistance: frequent cues to assure correct hand placement and for getting left leg under her when standing; cues and assisty to control descent Ambulation/Gait Ambulation/Gait Assistance: 4: Min guard Ambulation Distance (Feet): 20 Feet Assistive device: Rolling walker Gait Pattern:  Step-to pattern;Decreased step length - right;Decreased step length - left;Decreased stance time - right;Antalgic;Trunk flexed Gait velocity: Decreased. Stairs: Yes Stairs Assistance: 1: +2 Total assist;Patient percentage (comment);Other (comment) (pt. 70%) Stairs Assistance Details (indicate cue type and reason): verbal cues for technique and safety Stair Management Technique: No rails;Backwards;With walker Number of Stairs: 2    Exercises     PT Diagnosis:    PT Problem List:   PT Treatment Interventions:     PT Goals Acute Rehab PT Goals Pt will go Sit to Stand: with modified independence PT Goal: Sit to Stand - Progress: Progressing toward goal Pt will go Stand to Sit: with modified independence PT Goal: Stand to Sit - Progress: Progressing toward goal Pt will Go Up / Down Stairs: 1-2 stairs;with min assist;with rolling walker PT Goal: Up/Down Stairs - Progress: Progressing toward goal  Visit Information  Last PT Received On: 07/09/12 Assistance Needed: +2    Subjective Data  Subjective: Pt. says son and husband will help her get into home   Cognition  Cognition Arousal/Alertness: Awake/alert Behavior During Therapy: WFL for tasks assessed/performed Overall Cognitive Status: Within Functional Limits for tasks assessed    Balance     End of Session PT - End of Session Equipment Utilized During Treatment: Gait belt Activity Tolerance: Patient limited by fatigue Patient left: in chair;with call bell/phone within reach;with family/visitor present Nurse Communication: Mobility status CPM Right Knee CPM Right Knee: Off Right Knee Flexion (Degrees): 55 Right Knee Extension (Degrees): 0   GP     Ferman Hamming 07/09/2012, 3:17 PM Weldon Picking PT Acute Rehab Services [phone] Beeper [phone]

## 2012-07-09 NOTE — Progress Notes (Signed)
Physical Therapy Treatment Patient Details Name: [name] MRN: [id number] DOB: 05-25-1945 Today's Date: 07/09/2012 Time: 4098-1191 PT Time Calculation (min): 38 min  PT Assessment / Plan / Recommendation Comments on Treatment Session  Gradual progression with PT.  Practiced simulated step this am, hopeful to try steps in gym this pm.    Follow Up Recommendations  Home health PT;Supervision/Assistance - 24 hour;Supervision for mobility/OOB     Does the patient have the potential to tolerate intense rehabilitation     Barriers to Discharge        Equipment Recommendations  None recommended by PT    Recommendations for Other Services    Frequency 7X/week   Plan Discharge plan remains appropriate;Frequency remains appropriate    Precautions / Restrictions Precautions Precautions: Knee Precaution Comments: Knee immobilizer doesn't have the metal part. Required Braces or Orthoses: Knee Immobilizer - Right Knee Immobilizer - Right: Other (comment) (not specified in orders; presume when OOB) Restrictions Weight Bearing Restrictions: Yes RLE Weight Bearing: Weight bearing as tolerated   Pertinent Vitals/Pain Pain in right knee 4/10 ; RN provided pain med at pt. request    Mobility  Bed Mobility Bed Mobility: Not assessed Transfers Transfers: Sit to Stand;Stand to Sit Sit to Stand: 4: Min guard;With upper extremity assist;From chair/3-in-1;Other (comment) (4 reps) Stand to Sit: 4: Min guard;With upper extremity assist;To chair/3-in-1;Other (comment) (4 reps) Details for Transfer Assistance: frequent cues to assure correct hand placement and for getting left leg under her when standing; cues and assisty to control descent Ambulation/Gait Ambulation/Gait Assistance: 4: Min guard Ambulation Distance (Feet): 24 Feet Assistive device: Rolling walker Ambulation/Gait Assistance Details: Need initial cues frequently for sequencing and walker distance, improved near end of  session. Gait Pattern: Step-to pattern;Decreased step length - right;Decreased step length - left;Decreased stance time - right;Antalgic;Trunk flexed Gait velocity: Decreased. Stairs: Yes (simulated one step over rolled blanket) Stairs Assistance: 4: Min guard;4: Min assist Stairs Assistance Details (indicate cue type and reason): cues for technique and adequate stepping distance to clear "step" Stair Management Technique: No rails;Backwards;Forwards;With walker Number of Stairs: 1    Exercises Total Joint Exercises Ankle Circles/Pumps: 10 reps;Both Quad Sets: AROM;Right;10 reps Short Arc QuadBarbaraann Boys;Right;10 reps;Supine Heel Slides: AROM;Right;10 reps;Seated Goniometric ROM: 60 degrees flex-0 extension   PT Diagnosis:    PT Problem List:   PT Treatment Interventions:     PT Goals Acute Rehab PT Goals PT Goal Formulation: With patient Time For Goal Achievement: 07/14/12 Potential to Achieve Goals: Good Pt will go Sit to Stand: with modified independence PT Goal: Sit to Stand - Progress: Progressing toward goal Pt will go Stand to Sit: with modified independence PT Goal: Stand to Sit - Progress: Progressing toward goal Pt will Ambulate: 51 - 150 feet;with supervision;with rolling walker PT Goal: Ambulate - Progress: Progressing toward goal Pt will Go Up / Down Stairs: 1-2 stairs;with min assist;with rolling walker PT Goal: Up/Down Stairs - Progress: Progressing toward goal Pt will Perform Home Exercise Program: with supervision, verbal cues required/provided PT Goal: Perform Home Exercise Program - Progress: Progressing toward goal  Visit Information  Last PT Received On: 07/09/12 Assistance Needed: +2    Subjective Data      Cognition  Cognition Arousal/Alertness: Awake/alert Behavior During Therapy: WFL for tasks assessed/performed Overall Cognitive Status: Within Functional Limits for tasks assessed    Balance     End of Session PT - End of Session Equipment  Utilized During Treatment: Gait belt Activity Tolerance: Patient limited by fatigue  Patient left: in chair;with call bell/phone within reach Nurse Communication: Mobility status   GP     Ferman Hamming 07/09/2012, 9:30 AM Weldon Picking PT Acute Rehab Services [phone] Beeper [phone]

## 2012-07-09 NOTE — Progress Notes (Signed)
Subjective: Pt stable pain controlled - 55 on cpm   Objective: Vital signs in last 24 hours: Temp:  [97.8 F (36.6 C)-98.6 F (37 C)] 98.3 F (36.8 C) (04/17 0601) Pulse Rate:  [82-90] 85 (04/17 0601) Resp:  [17-18] 18 (04/17 0601) BP: (118-125)/(46-64) 125/64 mmHg (04/17 0601) SpO2:  [97 %-98 %] 98 % (04/17 0601)  Intake/Output from previous day: 04/16 0701 - 04/17 0700 In: 480 [P.O.:480] Out: 350 [Urine:350] Intake/Output this shift:    Exam:  Neurovascular intact Sensation intact distally Intact pulses distally Dorsiflexion/Plantar flexion intact  Labs:  Recent Labs  07/08/12 0558 07/09/12 0534  HGB 11.4* 10.4*    Recent Labs  07/08/12 0558 07/09/12 0534  WBC 11.0* 9.2  RBC 4.24 3.79*  HCT 34.5* 29.4*  PLT 187 127*    Recent Labs  07/08/12 0558  NA 135  K 4.3  CL 100  CO2 25  BUN 35*  CREATININE 1.80*  GLUCOSE 159*  CALCIUM 8.3*    Recent Labs  07/08/12 0558 07/09/12 0534  INR 1.20 1.85*    Assessment/Plan: Pt doing well - cr up re check am - possible dc am   DEAN,GREGORY SCOTT 07/09/2012, 9:02 AM

## 2012-07-09 NOTE — Progress Notes (Signed)
Occupational Therapy Treatment Patient Details Name: [name] MRN: [id number] DOB: [birth year] Today's Date: 07/09/2012 Time: 1478-2956 OT Time Calculation (min): 34 min  OT Assessment / Plan / Recommendation Comments on Treatment Session  Pt is progressing towards goals. Pt at [facility] level for LB dressing (only to minimally help with getting pants over socks and to keep pulled up over heels prior to standing). Practiced shower transfer and at minguard level.     Follow Up Recommendations  Home health OT;Supervision - Intermittent    Barriers to Discharge       Equipment Recommendations  3 in 1 bedside comode    Recommendations for Other Services    Frequency Min 2X/week   Plan Discharge plan remains appropriate    Precautions / Restrictions Precautions Precautions: Knee Precaution Booklet Issued: No Precaution Comments: Knee immobilizer doesn't have the metal part. Required Braces or Orthoses: Knee Immobilizer - Right Knee Immobilizer - Right: Other (comment) (not specified in orders;presume when OOB) Restrictions Weight Bearing Restrictions: Yes RLE Weight Bearing: Weight bearing as tolerated   Pertinent Vitals/Pain Pain 4/10 in knee. Nurse notified and gave pain meds.     ADL  Grooming: Min guard Where Assessed - Grooming: Supported standing Lower Body Bathing: Min guard Where Assessed - Lower Body Bathing: Supported sit to stand Lower Body Dressing: Min assist Where Assessed - Lower Body Dressing: Supported sit to Pharmacist, hospital: Hydrographic surveyor Method: Sit to Barista: Raised toilet seat with arms (or 3-in-1 over toilet) Toileting - Clothing Manipulation and Hygiene: Min guard Where Assessed - Toileting Clothing Manipulation and Hygiene: Standing;Sit to stand from 3-in-1 or toilet (clothing-standing and hygiene-sit to stand) Tub/Shower Transfer: Min guard (simulated shower transfer) Tub/Shower Transfer Method:  Science writer: Other (comment) (3 in 1) Equipment Used: Gait belt;Sock aid;Rolling walker;Reacher ADL Comments: Pt practiced with AE with LB ADLs and pt at [facility] level (to get pants over socks and keep up prior to standing). Cues for technique. Pt required cues for technique hip to get RLE over threshold in simulated shower.      OT Diagnosis:    OT Problem List:   OT Treatment Interventions:     OT Goals Acute Rehab OT Goals OT Goal Formulation: With patient Time For Goal Achievement: 07/15/12 Potential to Achieve Goals: Good ADL Goals Pt Will Perform Lower Body Bathing: with supervision;Sit to stand from chair ADL Goal: Lower Body Bathing - Progress: Progressing toward goals Pt Will Perform Lower Body Dressing: with supervision;Sit to stand from chair;Sit to stand from bed ADL Goal: Lower Body Dressing - Progress: Progressing toward goals Pt Will Transfer to Toilet: with modified independence;with DME ADL Goal: Toilet Transfer - Progress: Progressing toward goals Pt Will Perform Toileting - Clothing Manipulation: with supervision;Standing ADL Goal: Toileting - Clothing Manipulation - Progress: Progressing toward goals Pt Will Perform Toileting - Hygiene: with supervision;Sit to stand from 3-in-1/toilet ADL Goal: Toileting - Hygiene - Progress: Progressing toward goals Pt Will Perform Tub/Shower Transfer: Shower transfer;with supervision;with DME ADL Goal: Tub/Shower Transfer - Progress: Progressing toward goals  Visit Information  Last OT Received On: 07/09/12 Assistance Needed: +2    Subjective Data      Prior Functioning       Cognition  Cognition Arousal/Alertness: Awake/alert Behavior During Therapy: WFL for tasks assessed/performed Overall Cognitive Status: Within Functional Limits for tasks assessed    Mobility  Bed Mobility Bed Mobility: Not assessed Transfers Transfers: Sit to Stand;Stand to Sit Sit to Stand:  4: Min guard;With upper  extremity assist;From chair/3-in-1;Other (comment) Stand to Sit: 4: Min guard;With upper extremity assist;To chair/3-in-1;Other (comment) Details for Transfer Assistance: frequent cues to assure correct hand placement and for getting left leg under her when standing; cues and assisty to control descent       Balance     End of Session OT - End of Session Equipment Utilized During Treatment: Gait belt Activity Tolerance: Patient limited by fatigue Patient left: with call bell/phone within reach;in chair Nurse Communication: Patient requests pain meds  GO     Earlie Raveling OTR/L [phone] 07/09/2012, 10:08 AM

## 2012-07-09 NOTE — Progress Notes (Signed)
ANTICOAGULATION CONSULT NOTE Pharmacy Consult for Coumadin Indication: VTE prophylaxis  No Known Allergies  Medical History: Past Medical History  Diagnosis Date  . Hypertension   . PONV (postoperative nausea and vomiting)   . Arthritis     "both knees" (07/07/2012)   Assessment: 67 year old female s/p TKA beginning Coumadin for VTE prophylaxis  Baseline INR = 1.01, INR today = 1.85  Goal of Therapy:  INR 2-3 Monitor platelets by anticoagulation protocol: Yes   Plan:  1) Coumadin 5 mg po x 1 dose today 2) Daily INR 3) Coumadin education complete  Thank you. Okey Regal, PharmD [phone]  07/09/2012,11:30 AM

## 2012-07-10 LAB — BASIC METABOLIC PANEL
GFR calc Af Amer: 86 mL/min — ABNORMAL LOW (ref >90)
GFR calc non Af Amer: 74 mL/min — ABNORMAL LOW (ref >90)
Glucose, Bld: 108 mg/dL — ABNORMAL HIGH (ref 70–99)
Potassium: 3.3 mEq/L — ABNORMAL LOW (ref 3.5–5.1)
Sodium: 134 mEq/L — ABNORMAL LOW (ref 135–145)

## 2012-07-10 LAB — CBC
Hemoglobin: 9.1 g/dL — ABNORMAL LOW (ref 12.0–15.0)
MCHC: 34.9 g/dL (ref 30.0–36.0)
Platelets: 120 10*3/uL — ABNORMAL LOW (ref 150–400)

## 2012-07-10 LAB — PROTIME-INR
INR: 1.83 — ABNORMAL HIGH (ref 0.00–1.49)
Prothrombin Time: 20.5 seconds — ABNORMAL HIGH (ref 11.6–15.2)

## 2012-07-10 MED ORDER — OXYCODONE HCL 5 MG PO TABS: 5.0000 mg | ORAL_TABLET | ORAL | Status: DC | PRN

## 2012-07-10 MED ORDER — WARFARIN SODIUM 5 MG PO TABS: 5.0000 mg | ORAL_TABLET | Freq: Every day | ORAL | Status: DC

## 2012-07-10 MED ORDER — METHOCARBAMOL 500 MG PO TABS: 500.0000 mg | ORAL_TABLET | Freq: Three times a day (TID) | ORAL | Status: DC

## 2012-07-10 MED ORDER — WARFARIN SODIUM 7.5 MG PO TABS
7.5000 mg | ORAL_TABLET | Freq: Once | ORAL | Status: DC
  Filled 2012-07-10: qty 1

## 2012-07-10 NOTE — Care Management (Signed)
[name] is following this pt for [facility] and PT  Thank You Venia Minks  [phone]

## 2012-07-10 NOTE — Progress Notes (Signed)
Physical Therapy Treatment Patient Details Name: [name] MRN: [id number] DOB: December 30, 1945 Today's Date: 07/10/2012 Time:  -     PT Assessment / Plan / Recommendation Comments on Treatment Session       Follow Up Recommendations        Does the patient have the potential to tolerate intense rehabilitation     Barriers to Discharge        Equipment Recommendations       Recommendations for Other Services    Frequency     Plan      Precautions / Restrictions     Pertinent Vitals/Pain See vitals tab    Mobility       Exercises     PT Diagnosis:    PT Problem List:   PT Treatment Interventions:     PT Goals    Visit Information       Subjective Data      Cognition       Balance     End of Session     GP     Ferman Hamming 07/10/2012, 2:58 PM

## 2012-07-10 NOTE — Progress Notes (Signed)
Subjective: Pt stable - pain controlled - amb to PT room today   Objective: Vital signs in last 24 hours: Temp:  [98.7 F (37.1 C)-99.7 F (37.6 C)] 98.7 F (37.1 C) (04/18 0619) Pulse Rate:  [64-94] 64 (04/18 0619) Resp:  [16-18] 18 (04/18 0800) BP: (95-122)/(34-56) 95/34 mmHg (04/18 0619) SpO2:  [95 %-98 %] 98 % (04/18 0800) Weight:  [138 kg (304 lb 3.8 oz)] 138 kg (304 lb 3.8 oz) (04/18 0844)  Intake/Output from previous day: 04/17 0701 - 04/18 0700 In: 630 [P.O.:630] Out: -  Intake/Output this shift: Total I/O In: 240 [P.O.:240] Out: -   Exam:  Neurovascular intact Sensation intact distally Intact pulses distally Dorsiflexion/Plantar flexion intact No cellulitis present  Labs:  Recent Labs  07/08/12 0558 07/09/12 0534 07/10/12 0558  HGB 11.4* 10.4* 9.1*    Recent Labs  07/09/12 0534 07/10/12 0558  WBC 9.2 6.7  RBC 3.79* 3.36*  HCT 29.4* 26.1*  PLT 127* 120*    Recent Labs  07/08/12 0558 07/10/12 0558  NA 135 134*  K 4.3 3.3*  CL 100 100  CO2 25 29  BUN 35* 24*  CREATININE 1.80* 0.81  GLUCOSE 159* 108*  CALCIUM 8.3* 8.7    Recent Labs  07/09/12 0534 07/10/12 0558  INR 1.85* 1.83*    Assessment/Plan: Pt doing well - incision ok - plan dc today   DEAN,GREGORY SCOTT 07/10/2012, 11:07 AM

## 2012-07-10 NOTE — Progress Notes (Signed)
Pt discharged home via family; Pt and family given and explained all discharge instructions, carenotes, and prescriptions; pt and family stated understanding and denied questions/concerns; all f/u appointments in place; IV removed without complicaitons; pt stable at time of discharge  

## 2012-07-10 NOTE — Progress Notes (Signed)
ANTICOAGULATION CONSULT NOTE - Follow Up Consult  Pharmacy Consult:  Coumadin Indication:   VTE prophylaxis  No Known Allergies  Patient Measurements: Height: 5\' 3"  (160 cm) Weight: 304 lb 3.8 oz (138 kg) IBW/kg (Calculated) : 52.4   Vital Signs: Temp: 98.7 F (37.1 C) (04/18 0619) Temp src: Oral (04/18 0619) BP: 95/34 mmHg (04/18 0619) Pulse Rate: 64 (04/18 0619)  Labs:  Recent Labs  07/08/12 0558 07/09/12 0534 07/10/12 0558  HGB 11.4* 10.4* 9.1*  HCT 34.5* 29.4* 26.1*  PLT 187 127* 120*  LABPROT 15.0 20.7* 20.5*  INR 1.20 1.85* 1.83*  CREATININE 1.80*  --  0.81    Estimated Creatinine Clearance: 93.4 ml/min (by C-G formula based on Cr of 0.81).      Assessment: 67 year old female s/p TKA to continue on Coumadin for VTE prophylaxis.  INR remains sub-therapeutic; no bleeding reported.  Noted possible discharge today.   Goal of Therapy:  INR 2-3 Monitor platelets by anticoagulation protocol: Yes    Plan:  - Coumadin 7.5mg  PO today prior to discharge.  Recommend discharge patient on Coumadin 5mg  PO daily with INR check on Monday 07/13/12 as patient may require a larger maintenance dose. - PT / INR in AM if still here     Jenica Costilow D. Laney Potash, PharmD, BCPS Pager:  [phone] 07/10/2012, 10:05 AM

## 2012-07-10 NOTE — Progress Notes (Signed)
07/10/12 0938  PT Visit Information  Last PT Received On 07/10/12  Assistance Needed +2 (+1 for level surfaces, +2 for steps)  PT Time Calculation  PT Start Time [phone]  PT Stop Time 1009  PT Time Calculation (min) 31 min  Subjective Data  Subjective Pt.'s 2 sons and husband present to assist in getting pt. into home with 2 steps.  Precautions  Precautions Knee  Precaution Booklet Issued Yes (comment)  Precaution Comments instructed pt. in knee exercises belwo and checked them off on handout given  Required Braces or Orthoses Knee Immobilizer - Right  Restrictions  Weight Bearing Restrictions Yes  RLE Weight Bearing WBAT  Cognition  Arousal/Alertness Awake/alert  Behavior During Therapy WFL for tasks assessed/performed  Overall Cognitive Status Within Functional Limits for tasks assessed  Bed Mobility  Bed Mobility Not assessed  Transfers  Transfers Sit to Stand;Stand to Sit  Sit to Stand 5: Supervision;With upper extremity assist;From chair/3-in-1  Stand to Sit 5: Supervision;With armrests;To chair/3-in-1  Details for Transfer Assistance pt. more balanced and steady in sit<>stand, needing only supervision for safety  Ambulation/Gait  Ambulation/Gait Assistance 5: Supervision  Ambulation Distance (Feet) 80 Feet  Assistive device Rolling walker  Ambulation/Gait Assistance Details cues for correct sequence/gait pattern  Gait Pattern Step-through pattern;Trunk flexed  Stairs Yes  Stairs Assistance 1: +2 Total assist;Patient percentage (comment) (pt. 80%)  Stairs Assistance Details (indicate cue type and reason) Pt.'s husband and 2 sosns were educated in technique to assist pt. up 2 steps with one person bracing RW in the fron, and 2nd person in back stabilizing pt. with safety belt.  Adivised that family use safety belt at home when negotiating 2 steps to enter.  Stair Management Technique No rails;Backwards;With walker  Number of Stairs 2  Total Joint Exercises  Goniometric  ROM 0-75 degrees AA  PT - End of Session  Equipment Utilized During Treatment Gait belt  Activity Tolerance Patient limited by fatigue  Patient left in chair;with call bell/phone within reach;with family/visitor present  Nurse Communication Mobility status  PT - Assessment/Plan  Comments on Treatment Session Sons demonstrated appropriate technique in assisting their mom in steps negoptiation.  Pt. should be ready for Dc home when MD DC's .  Pt will continue on with HHPT.  PT Plan Discharge plan remains appropriate;Frequency remains appropriate  PT Frequency 7X/week  Follow Up Recommendations Home health PT;Supervision/Assistance - 24 hour;Supervision for mobility/OOB  PT equipment None recommended by PT  Acute Rehab PT Goals  Pt will go Sit to Stand with modified independence  PT Goal: Sit to Stand - Progress Progressing toward goal  Pt will go Stand to Sit with modified independence  PT Goal: Stand to Sit - Progress Progressing toward goal  Pt will Ambulate 51 - 150 feet;with supervision;with rolling walker  PT Goal: Ambulate - Progress Progressing toward goal  Pt will Go Up / Down Stairs 1-2 stairs;with min assist;with rolling walker  PT Goal: Up/Down Stairs - Progress Progressing toward goal  PT General Charges  $$ ACUTE PT VISIT 1 Procedure  PT Treatments  $Gait Training 23-37 mins  Weldon Picking PT Acute Rehab Services [phone] Beeper [phone]

## 2012-07-10 NOTE — Discharge Summary (Signed)
Physician Discharge Summary  Patient ID: [name] MRN: [id number] DOB/AGE: 1946-01-18 67 y.o.  Admit date: 07/07/2012 Discharge date: 07/10/2012  Admission Diagnoses:  Knee arthritis left  Discharge Diagnoses:  Same  Surgeries: Procedure(s): TOTAL KNEE ARTHROPLASTY on 07/07/2012   Consultants:    Discharged Condition: Stable  Hospital Course: [name] is an 67 y.o. female who was admitted 07/07/2012 with a chief complaint of knee pain, and found to have a diagnosis of knee arthritis.  They were brought to the operating room on 07/07/2012 and underwent the above named procedures.  Post op the patient mobilized with PT well and was started on coumadin for dvt prophylaxis. Incision looked good at time of dc  Antibiotics given:  Anti-infectives   Start     Dose/Rate Route Frequency Ordered Stop   07/07/12 1515  ceFAZolin (ANCEF) IVPB 2 g/50 mL premix     2 g 100 mL/hr over 30 Minutes Intravenous Every 6 hours 07/07/12 1501 07/07/12 2242   07/07/12 0600  ceFAZolin (ANCEF) 3 g in dextrose 5 % 50 mL IVPB     3 g 160 mL/hr over 30 Minutes Intravenous On call to O.R. 07/06/12 1444 07/07/12 0735    .  Recent vital signs:  Filed Vitals:   07/10/12 0800  BP:   Pulse:   Temp:   Resp: 18    Recent laboratory studies:  Results for orders placed during the hospital encounter of 07/07/12  PROTIME-INR      Result Value Range   Prothrombin Time 15.0  11.6 - 15.2 seconds   INR 1.20  0.00 - 1.49  CBC      Result Value Range   WBC 11.0 (*) 4.0 - 10.5 K/uL   RBC 4.24  3.87 - 5.11 MIL/uL   Hemoglobin 11.4 (*) 12.0 - 15.0 g/dL   HCT 95.6 (*) 21.3 - 08.6 %   MCV 81.4  78.0 - 100.0 fL   MCH 26.9  26.0 - 34.0 pg   MCHC 33.0  30.0 - 36.0 g/dL   RDW 57.8  46.9 - 62.9 %   Platelets 187  150 - 400 K/uL  BASIC METABOLIC PANEL      Result Value Range   Sodium 135  135 - 145 mEq/L   Potassium 4.3  3.5 - 5.1 mEq/L   Chloride 100  96 - 112 mEq/L   CO2 25  19 - 32 mEq/L   Glucose,  Bld 159 (*) 70 - 99 mg/dL   BUN 35 (*) 6 - 23 mg/dL   Creatinine, Ser 5.28 (*) 0.50 - 1.10 mg/dL   Calcium 8.3 (*) 8.4 - 10.5 mg/dL   GFR calc non Af Amer 28 (*) >90 mL/min   GFR calc Af Amer 33 (*) >90 mL/min  PROTIME-INR      Result Value Range   Prothrombin Time 20.7 (*) 11.6 - 15.2 seconds   INR 1.85 (*) 0.00 - 1.49  CBC      Result Value Range   WBC 9.2  4.0 - 10.5 K/uL   RBC 3.79 (*) 3.87 - 5.11 MIL/uL   Hemoglobin 10.4 (*) 12.0 - 15.0 g/dL   HCT 41.3 (*) 24.4 - 01.0 %   MCV 77.6 (*) 78.0 - 100.0 fL   MCH 27.4  26.0 - 34.0 pg   MCHC 35.4  30.0 - 36.0 g/dL   RDW 27.2  53.6 - 64.4 %   Platelets 127 (*) 150 - 400 K/uL  PROTIME-INR  Result Value Range   Prothrombin Time 20.5 (*) 11.6 - 15.2 seconds   INR 1.83 (*) 0.00 - 1.49  CBC      Result Value Range   WBC 6.7  4.0 - 10.5 K/uL   RBC 3.36 (*) 3.87 - 5.11 MIL/uL   Hemoglobin 9.1 (*) 12.0 - 15.0 g/dL   HCT 16.1 (*) 09.6 - 04.5 %   MCV 77.7 (*) 78.0 - 100.0 fL   MCH 27.1  26.0 - 34.0 pg   MCHC 34.9  30.0 - 36.0 g/dL   RDW 40.9  81.1 - 91.4 %   Platelets 120 (*) 150 - 400 K/uL  BASIC METABOLIC PANEL      Result Value Range   Sodium 134 (*) 135 - 145 mEq/L   Potassium 3.3 (*) 3.5 - 5.1 mEq/L   Chloride 100  96 - 112 mEq/L   CO2 29  19 - 32 mEq/L   Glucose, Bld 108 (*) 70 - 99 mg/dL   BUN 24 (*) 6 - 23 mg/dL   Creatinine, Ser 7.82  0.50 - 1.10 mg/dL   Calcium 8.7  8.4 - 95.6 mg/dL   GFR calc non Af Amer 74 (*) >90 mL/min   GFR calc Af Amer 86 (*) >90 mL/min    Discharge Medications:     Medication List    STOP taking these medications       Fish Oil 1000 MG Caps     ibuprofen 800 MG tablet  Commonly known as:  ADVIL,MOTRIN      TAKE these medications       CALCIUM 600+D 600-400 MG-UNIT per tablet  Generic drug:  Calcium Carbonate-Vitamin D  Take 1 tablet by mouth daily.     gabapentin 100 MG capsule  Commonly known as:  NEURONTIN  Take 100 mg by mouth 3 (three) times daily.      hydrochlorothiazide 25 MG tablet  Commonly known as:  HYDRODIURIL  Take 25 mg by mouth daily.     methocarbamol 500 MG tablet  Commonly known as:  ROBAXIN  Take 1 tablet (500 mg total) by mouth 3 (three) times daily.     oxyCODONE 5 MG immediate release tablet  Commonly known as:  Oxy IR/ROXICODONE  Take 1-2 tablets (5-10 mg total) by mouth every 3 (three) hours as needed.     ramipril 5 MG capsule  Commonly known as:  ALTACE  TAKE 1 CAPSULE (5 MG TOTAL) BY MOUTH DAILY.     warfarin 5 MG tablet  Commonly known as:  COUMADIN  Take 1 tablet (5 mg total) by mouth daily.        Diagnostic Studies: Dg Chest 2 View  06/29/2012  *RADIOLOGY REPORT*  Clinical Data: Preop right total knee arthroplasty  CHEST - 2 VIEW  Comparison: None.  Findings: Lungs are clear. No pleural effusion or pneumothorax.  Cardiomediastinal silhouette is within normal limits.  Degenerative changes of the visualized thoracolumbar spine.  IMPRESSION: No evidence of acute cardiopulmonary disease.   Original Report Authenticated By: Charline Bills, M.D.     Disposition: Final discharge disposition not confirmed      Discharge Orders   Future Appointments Provider Department Dept Phone   07/27/2012 8:30 AM Dianne Dun, MD  [facility] [phone]   Future Orders Complete By Expires     Call MD / Call 911  As directed     Comments:      If you experience chest pain or  shortness of breath, CALL 911 and be transported to the hospital emergency room.  If you develope a fever above 101 F, pus (white drainage) or increased drainage or redness at the wound, or calf pain, call your surgeon's office.    Constipation Prevention  As directed     Comments:      Drink plenty of fluids.  Prune juice may be helpful.  You may use a stool softener, such as Colace (over the counter) 100 mg twice a day.  Use MiraLax (over the counter) for constipation as needed.    Diet - low sodium heart healthy  As directed      Discharge instructions  As directed     Comments:      1. CPM 2 hours per 8 - increase daily 2. Change dressing Sunday 3.Keep incision dry    Increase activity slowly as tolerated  As directed           Signed: Rodricus Candelaria SCOTT 07/10/2012, 11:12 AM

## 2012-07-11 DIAGNOSIS — M1710 Unilateral primary osteoarthritis, unspecified knee: Secondary | ICD-10-CM | POA: Diagnosis not present

## 2012-07-13 NOTE — Progress Notes (Signed)
Seen and agreed 07/13/2012 Robinette, Julia Elizabeth PTA [phone] pager [phone] office    

## 2012-07-17 ENCOUNTER — Encounter: Payer: Self-pay | Admitting: Internal Medicine

## 2012-07-22 ENCOUNTER — Other Ambulatory Visit: Payer: Self-pay | Admitting: *Deleted

## 2012-07-22 DIAGNOSIS — M1710 Unilateral primary osteoarthritis, unspecified knee: Secondary | ICD-10-CM | POA: Diagnosis not present

## 2012-07-22 MED ORDER — GABAPENTIN 100 MG PO CAPS: 100.0000 mg | ORAL_CAPSULE | Freq: Three times a day (TID) | ORAL | Status: DC

## 2012-07-22 MED ORDER — HYDROCHLOROTHIAZIDE 25 MG PO TABS: 25.0000 mg | ORAL_TABLET | Freq: Every day | ORAL | Status: DC

## 2012-07-24 ENCOUNTER — Telehealth: Payer: Self-pay

## 2012-07-24 NOTE — Telephone Encounter (Signed)
Ok to make appt with coumadin clinic.

## 2012-07-24 NOTE — Telephone Encounter (Signed)
Advised Cindy with [facility], she will have patient call back to schedule coumadin clinic appt.

## 2012-07-24 NOTE — Telephone Encounter (Signed)
Cindy with [facility] called; pt had total knee replacement 07/07/12; Dr August Saucer wants tp to take Coumadin 4 weeks post surgery. Pt is being transitioned from [facility] to outpt care starting next week; pt needs INR done for 2 more weeks; does pt need to make appt with Dr Dayton Martes or appt with Coumadin clinic. Cindy request call back.-

## 2012-07-27 ENCOUNTER — Encounter: Payer: Federal, State, Local not specified - PPO | Admitting: Family Medicine

## 2012-07-29 DIAGNOSIS — R269 Unspecified abnormalities of gait and mobility: Secondary | ICD-10-CM | POA: Diagnosis not present

## 2012-07-29 DIAGNOSIS — M6281 Muscle weakness (generalized): Secondary | ICD-10-CM | POA: Diagnosis not present

## 2012-07-29 DIAGNOSIS — M25669 Stiffness of unspecified knee, not elsewhere classified: Secondary | ICD-10-CM | POA: Diagnosis not present

## 2012-07-29 DIAGNOSIS — Z96659 Presence of unspecified artificial knee joint: Secondary | ICD-10-CM | POA: Diagnosis not present

## 2012-07-30 ENCOUNTER — Ambulatory Visit (INDEPENDENT_AMBULATORY_CARE_PROVIDER_SITE_OTHER): Payer: Federal, State, Local not specified - PPO | Admitting: General Practice

## 2012-07-30 DIAGNOSIS — I4891 Unspecified atrial fibrillation: Secondary | ICD-10-CM | POA: Diagnosis not present

## 2012-07-30 DIAGNOSIS — Z96659 Presence of unspecified artificial knee joint: Secondary | ICD-10-CM | POA: Insufficient documentation

## 2012-07-30 LAB — POCT INR: INR: 1.9

## 2012-07-31 DIAGNOSIS — M25669 Stiffness of unspecified knee, not elsewhere classified: Secondary | ICD-10-CM | POA: Diagnosis not present

## 2012-07-31 DIAGNOSIS — M6281 Muscle weakness (generalized): Secondary | ICD-10-CM | POA: Diagnosis not present

## 2012-07-31 DIAGNOSIS — R269 Unspecified abnormalities of gait and mobility: Secondary | ICD-10-CM | POA: Diagnosis not present

## 2012-07-31 DIAGNOSIS — Z96659 Presence of unspecified artificial knee joint: Secondary | ICD-10-CM | POA: Diagnosis not present

## 2012-08-03 DIAGNOSIS — Z96659 Presence of unspecified artificial knee joint: Secondary | ICD-10-CM | POA: Diagnosis not present

## 2012-08-03 DIAGNOSIS — M25669 Stiffness of unspecified knee, not elsewhere classified: Secondary | ICD-10-CM | POA: Diagnosis not present

## 2012-08-03 DIAGNOSIS — M6281 Muscle weakness (generalized): Secondary | ICD-10-CM | POA: Diagnosis not present

## 2012-08-03 DIAGNOSIS — R269 Unspecified abnormalities of gait and mobility: Secondary | ICD-10-CM | POA: Diagnosis not present

## 2012-08-05 DIAGNOSIS — M6281 Muscle weakness (generalized): Secondary | ICD-10-CM | POA: Diagnosis not present

## 2012-08-05 DIAGNOSIS — M25669 Stiffness of unspecified knee, not elsewhere classified: Secondary | ICD-10-CM | POA: Diagnosis not present

## 2012-08-05 DIAGNOSIS — Z96659 Presence of unspecified artificial knee joint: Secondary | ICD-10-CM | POA: Diagnosis not present

## 2012-08-05 DIAGNOSIS — R269 Unspecified abnormalities of gait and mobility: Secondary | ICD-10-CM | POA: Diagnosis not present

## 2012-08-07 DIAGNOSIS — M6281 Muscle weakness (generalized): Secondary | ICD-10-CM | POA: Diagnosis not present

## 2012-08-07 DIAGNOSIS — Z96659 Presence of unspecified artificial knee joint: Secondary | ICD-10-CM | POA: Diagnosis not present

## 2012-08-07 DIAGNOSIS — R269 Unspecified abnormalities of gait and mobility: Secondary | ICD-10-CM | POA: Diagnosis not present

## 2012-08-07 DIAGNOSIS — M25669 Stiffness of unspecified knee, not elsewhere classified: Secondary | ICD-10-CM | POA: Diagnosis not present

## 2012-08-10 DIAGNOSIS — M6281 Muscle weakness (generalized): Secondary | ICD-10-CM | POA: Diagnosis not present

## 2012-08-10 DIAGNOSIS — Z96659 Presence of unspecified artificial knee joint: Secondary | ICD-10-CM | POA: Diagnosis not present

## 2012-08-10 DIAGNOSIS — M25669 Stiffness of unspecified knee, not elsewhere classified: Secondary | ICD-10-CM | POA: Diagnosis not present

## 2012-08-10 DIAGNOSIS — R269 Unspecified abnormalities of gait and mobility: Secondary | ICD-10-CM | POA: Diagnosis not present

## 2012-08-13 DIAGNOSIS — M6281 Muscle weakness (generalized): Secondary | ICD-10-CM | POA: Diagnosis not present

## 2012-08-13 DIAGNOSIS — R269 Unspecified abnormalities of gait and mobility: Secondary | ICD-10-CM | POA: Diagnosis not present

## 2012-08-13 DIAGNOSIS — M25669 Stiffness of unspecified knee, not elsewhere classified: Secondary | ICD-10-CM | POA: Diagnosis not present

## 2012-08-14 DIAGNOSIS — R269 Unspecified abnormalities of gait and mobility: Secondary | ICD-10-CM | POA: Diagnosis not present

## 2012-08-14 DIAGNOSIS — M25669 Stiffness of unspecified knee, not elsewhere classified: Secondary | ICD-10-CM | POA: Diagnosis not present

## 2012-08-14 DIAGNOSIS — M6281 Muscle weakness (generalized): Secondary | ICD-10-CM | POA: Diagnosis not present

## 2012-08-19 DIAGNOSIS — R269 Unspecified abnormalities of gait and mobility: Secondary | ICD-10-CM | POA: Diagnosis not present

## 2012-08-19 DIAGNOSIS — M6281 Muscle weakness (generalized): Secondary | ICD-10-CM | POA: Diagnosis not present

## 2012-08-19 DIAGNOSIS — M25669 Stiffness of unspecified knee, not elsewhere classified: Secondary | ICD-10-CM | POA: Diagnosis not present

## 2012-08-19 DIAGNOSIS — Z96659 Presence of unspecified artificial knee joint: Secondary | ICD-10-CM | POA: Diagnosis not present

## 2012-08-21 DIAGNOSIS — R269 Unspecified abnormalities of gait and mobility: Secondary | ICD-10-CM | POA: Diagnosis not present

## 2012-08-21 DIAGNOSIS — M6281 Muscle weakness (generalized): Secondary | ICD-10-CM | POA: Diagnosis not present

## 2012-08-21 DIAGNOSIS — M25669 Stiffness of unspecified knee, not elsewhere classified: Secondary | ICD-10-CM | POA: Diagnosis not present

## 2012-08-21 DIAGNOSIS — Z96659 Presence of unspecified artificial knee joint: Secondary | ICD-10-CM | POA: Diagnosis not present

## 2012-08-24 DIAGNOSIS — R269 Unspecified abnormalities of gait and mobility: Secondary | ICD-10-CM | POA: Diagnosis not present

## 2012-08-24 DIAGNOSIS — Z96659 Presence of unspecified artificial knee joint: Secondary | ICD-10-CM | POA: Diagnosis not present

## 2012-08-24 DIAGNOSIS — M25669 Stiffness of unspecified knee, not elsewhere classified: Secondary | ICD-10-CM | POA: Diagnosis not present

## 2012-08-24 DIAGNOSIS — M6281 Muscle weakness (generalized): Secondary | ICD-10-CM | POA: Diagnosis not present

## 2012-08-28 DIAGNOSIS — Z96659 Presence of unspecified artificial knee joint: Secondary | ICD-10-CM | POA: Diagnosis not present

## 2012-08-28 DIAGNOSIS — M6281 Muscle weakness (generalized): Secondary | ICD-10-CM | POA: Diagnosis not present

## 2012-08-28 DIAGNOSIS — M25669 Stiffness of unspecified knee, not elsewhere classified: Secondary | ICD-10-CM | POA: Diagnosis not present

## 2012-08-28 DIAGNOSIS — R269 Unspecified abnormalities of gait and mobility: Secondary | ICD-10-CM | POA: Diagnosis not present

## 2012-09-10 ENCOUNTER — Ambulatory Visit (INDEPENDENT_AMBULATORY_CARE_PROVIDER_SITE_OTHER): Payer: Medicare Other | Admitting: Family Medicine

## 2012-09-10 ENCOUNTER — Encounter: Payer: Self-pay | Admitting: Family Medicine

## 2012-09-10 ENCOUNTER — Other Ambulatory Visit: Payer: Self-pay | Admitting: Orthopedic Surgery

## 2012-09-10 ENCOUNTER — Other Ambulatory Visit (HOSPITAL_COMMUNITY)
Admission: RE | Admit: 2012-09-10 | Discharge: 2012-09-10 | Disposition: A | Discharge status: Home / Self Care | Payer: Medicare Other | Source: Ambulatory Visit | Attending: Family Medicine | Admitting: Family Medicine

## 2012-09-10 VITALS — BP 150/80 | HR 64 | Temp 97.8°F | Ht 63.0 in | Wt 295.0 lb

## 2012-09-10 DIAGNOSIS — Z136 Encounter for screening for cardiovascular disorders: Secondary | ICD-10-CM

## 2012-09-10 DIAGNOSIS — Z1151 Encounter for screening for human papillomavirus (HPV): Secondary | ICD-10-CM | POA: Diagnosis not present

## 2012-09-10 DIAGNOSIS — Z01419 Encounter for gynecological examination (general) (routine) without abnormal findings: Secondary | ICD-10-CM | POA: Insufficient documentation

## 2012-09-10 DIAGNOSIS — I10 Essential (primary) hypertension: Secondary | ICD-10-CM | POA: Diagnosis not present

## 2012-09-10 DIAGNOSIS — Z0000 Encounter for general adult medical examination without abnormal findings: Secondary | ICD-10-CM | POA: Diagnosis not present

## 2012-09-10 LAB — LIPID PANEL
HDL: 52.9 mg/dL (ref >39.00)
Total CHOL/HDL Ratio: 4
VLDL: 34 mg/dL (ref 0.0–40.0)

## 2012-09-10 LAB — COMPREHENSIVE METABOLIC PANEL
ALT: 17 U/L (ref 0–35)
AST: 21 U/L (ref 0–37)
Alkaline Phosphatase: 69 U/L (ref 39–117)
Creatinine, Ser: 0.9 mg/dL (ref 0.4–1.2)
Sodium: 139 mEq/L (ref 135–145)
Total Bilirubin: 0.8 mg/dL (ref 0.3–1.2)
Total Protein: 7.3 g/dL (ref 6.0–8.3)

## 2012-09-10 NOTE — Progress Notes (Signed)
67 yo here for Welcome to Medicare Visit.  I have personally reviewed the Medicare Annual Wellness questionnaire and have noted 1. The patient's medical and social history 2. Their use of alcohol, tobacco or illicit drugs 3. Their current medications and supplements 4. The patient's functional ability including ADL's, fall risks, home safety risks and hearing or visual             impairment. 5. Diet and physical activities 6. Evidence for depression or mood disorders  End of life wishes discussed and updated in Social History.  S/p Right TKR in 06/2012 by August Saucer. Scheduled for Left TKR in July 2014.  Doing well.  Doing PT and felt that it has been beneficial.   HTN- Has been controlled on Altace 5 mg adn HCTZ 25 mg.     No CP, SOB, LE edema.   Well woman- G3P3, no h/o abnormal pap smears.  Last pap smear was in 2011.  Denies any post menopausal bleeding. UTD on mammogram, colonoscopy.  She will be due for colonoscopy to fall (bath parents had colon CA).  No personal or family h/o uterine, breast, or ovarian CA.  Patient Active Problem List   Diagnosis Date Noted  . Routine general medical examination at a health care facility 08/21/2010  . SCIATICA 07/18/2009  . KNEE PAIN, BILATERAL 05/01/2009  . PURE HYPERCHOLESTEROLEMIA 02/08/2008  . HYPERGLYCEMIA 12/19/2006  . OBESITY, MORBID 07/10/2006  . HYPERTENSION 09/06/2005   Past Medical History  Diagnosis Date  . Hypertension   . PONV (postoperative nausea and vomiting)   . Arthritis     "both knees" (07/07/2012)   Past Surgical History  Procedure Laterality Date  . Vaginal delivery      X3  . Total knee arthroplasty Right 07/07/2012  . Joint replacement    . Total knee arthroplasty Right 07/07/2012    Procedure: TOTAL KNEE ARTHROPLASTY;  Surgeon: Cammy Copa, MD;  Location: [facility] OR;  Service: Orthopedics;  Laterality: Right;  Right Total Knee Arthroplasty   History  Substance Use Topics  . Smoking status: Never Smoker    . Smokeless tobacco: Never Used  . Alcohol Use: No   No family history on file. No Known Allergies Current Outpatient Prescriptions on File Prior to Visit  Medication Sig Dispense Refill  . Calcium Carbonate-Vitamin D (CALCIUM 600+D) 600-400 MG-UNIT per tablet Take 1 tablet by mouth daily.        Marland Kitchen gabapentin (NEURONTIN) 100 MG capsule Take 1 capsule (100 mg total) by mouth 3 (three) times daily.  270 capsule  0  . hydrochlorothiazide (HYDRODIURIL) 25 MG tablet Take 1 tablet (25 mg total) by mouth daily.  90 tablet  0  . methocarbamol (ROBAXIN) 500 MG tablet Take 1 tablet (500 mg total) by mouth 3 (three) times daily.  30 tablet  0  . oxyCODONE (OXY IR/ROXICODONE) 5 MG immediate release tablet Take 1-2 tablets (5-10 mg total) by mouth every 3 (three) hours as needed.  60 tablet  0  . ramipril (ALTACE) 5 MG capsule TAKE 1 CAPSULE (5 MG TOTAL) BY MOUTH DAILY.  90 capsule  0  . warfarin (COUMADIN) 5 MG tablet Take 1 tablet (5 mg total) by mouth daily.  30 tablet  1   No current facility-administered medications on file prior to visit.   The PMH, PSH, Social History, Family History, Medications, and allergies have been reviewed in [facility], and have been updated if relevant.  The PMH, PSH, Social History, Family History, Medications,  and allergies have been reviewed in [facility], and have been updated if relevant.   Review of Systems       See HPI Patient reports no  vision/ hearing changes,anorexia, weight change, fever ,adenopathy, persistant / recurrent hoarseness, swallowing issues, chest pain, edema,persistant / recurrent cough, hemoptysis, dyspnea(rest, exertional, paroxysmal nocturnal), gastrointestinal  bleeding (melena, rectal bleeding), abdominal pain, excessive heart burn, GU symptoms(dysuria, hematuria, pyuria, voiding/incontinence  Issues) syncope, focal weakness, severe memory loss, concerning skin lesions, depression, anxiety, abnormal bruising/bleeding, major joint swelling, breast masses  or abnormal vaginal bleeding.     Physical Exam BP 150/80  Pulse 64  Temp(Src) 97.8 F (36.6 C)  Ht 5\' 3"  (1.6 m)  Wt 295 lb (133.811 kg)  BMI 52.27 kg/m2  General:  alert, well-developed, well-nourished, and well-hydrated.  morbidly obese  Head:  normocephalic and atraumatic.   Eyes:  vision grossly intact, pupils equal, pupils round, and pupils reactive to light.   Ears:  R ear normal and L ear normal.   Nose:  no external deformity.   Mouth:  good dentition.   Neck:  No deformities, masses, or tenderness noted. Breasts:  No mass, nodules, thickening, tenderness, bulging, retraction, inflamation, nipple discharge or skin changes noted.   Lungs:  Normal respiratory effort, chest expands symmetrically. Lungs are clear to auscultation, no crackles or wheezes. Heart:  Normal rate and regular rhythm. S1 and S2 normal without gallop, murmur, click, rub or other extra sounds. Abdomen:  Bowel sounds positive,abdomen soft and non-tender without masses, organomegaly or hernias noted. Rectal:  no external abnormalities.   Genitalia:  Pelvic Exam:        External: normal female genitalia without lesions or masses        Vagina: normal without lesions or masses        Cervix: normal without lesions or masses        Adnexa: normal bimanual exam without masses or fullness        Uterus: normal by palpation        Pap smear: performed Msk:  No deformity or scoliosis noted of thoracic or lumbar spine.   Extremities:  No clubbing, cyanosis, edema, or deformity noted with normal full range of motion of all joints.   Neurologic:  alert & oriented X3 and gait normal.   Skin:  Intact without suspicious lesions or rashes Cervical Nodes:  No lymphadenopathy noted Axillary Nodes:  No palpable lymphadenopathy Psych:  Cognition and judgment appear intact. Alert and cooperative with normal attention span and concentration. No apparent delusions, illusions, hallucinations  Assessment and Plan: 1.  Routine general medical examination at a health care facility Reviewed preventive care protocols, scheduled due services, and updated immunizations Discussed nutrition, exercise, diet, and healthy lifestyle.  Pap smear today since it has been several years since she had one. She will schedule colonoscopy this fall and mammogram after her TKR next month.  2. HYPERTENSION Has been well controlled.  Asymptomatic.  H/o white coat HTN.   as

## 2012-09-10 NOTE — Patient Instructions (Addendum)
Great to see you. We will call you with your lab results.  Good luck with your surgery next month.  Please schedule your colonoscopy this fall and your mammogram next month.

## 2012-09-10 NOTE — Addendum Note (Signed)
Addended by: Eliezer Bottom on: 09/10/2012 09:39 AM   Modules accepted: Orders

## 2012-09-15 ENCOUNTER — Encounter: Payer: Self-pay | Admitting: Family Medicine

## 2012-10-02 ENCOUNTER — Encounter (HOSPITAL_COMMUNITY): Payer: Self-pay | Admitting: Pharmacy Technician

## 2012-10-02 NOTE — Pre-Procedure Instructions (Addendum)
Priseis Cratty  10/02/2012   Your procedure is scheduled on: Tues, July 22nd.  Report to [facility] at 9:00 AM.  Call this number if you have problems the morning of surgery: [phone]   Remember:   Do not eat food or drink liquids after midnight.   Take these medicines the morning of surgery with A SIP OF WATER: gabapentin (NEURONTIN). Stop taking Aspirin, Coumadin, Plavix, Effient and Herbal medications,  ZO:XWRU OIL.  Do not take any NSAIDs ie: Ibuprofen,  Advil,Naproxen or any medication containing Aspirin.    Do not wear jewelry, make-up or nail polish.  Do not wear lotions, powders, or perfumes. You may wear deodorant.  Do not shave 48 hours prior to surgery.   Do not bring valuables to the hospital.  [facility] is not responsible for any belongings or valuables.  Contacts, dentures or bridgework may not be worn into surgery.  Leave suitcase in the car. After surgery it may be brought to your room.  For patients admitted to the hospital, checkout time is 11:00 AM the day of discharge.     Special Instructions: Shower using CHG 2 nights before surgery and the night before surgery.  If you shower the day of surgery use CHG.  Use special wash - you have one bottle of CHG for all showers.  You should use approximately 1/3 of the bottle for each shower.   Please read over the following fact sheets that you were given: Pain Booklet, Coughing and Deep Breathing, Blood Transfusion Information and Surgical Site Infection Prevention

## 2012-10-05 ENCOUNTER — Encounter (HOSPITAL_COMMUNITY)
Admission: RE | Admit: 2012-10-05 | Discharge: 2012-10-05 | Disposition: A | Discharge status: Home / Self Care | Payer: Medicare Other | Source: Ambulatory Visit | Attending: Orthopedic Surgery | Admitting: Orthopedic Surgery

## 2012-10-05 ENCOUNTER — Encounter (HOSPITAL_COMMUNITY): Payer: Self-pay

## 2012-10-05 DIAGNOSIS — Z01812 Encounter for preprocedural laboratory examination: Secondary | ICD-10-CM | POA: Insufficient documentation

## 2012-10-05 DIAGNOSIS — Z01818 Encounter for other preprocedural examination: Secondary | ICD-10-CM | POA: Insufficient documentation

## 2012-10-05 LAB — TYPE AND SCREEN: Antibody Screen: NEGATIVE

## 2012-10-05 LAB — URINALYSIS, ROUTINE W REFLEX MICROSCOPIC
Glucose, UA: NEGATIVE mg/dL
Ketones, ur: NEGATIVE mg/dL
Leukocytes, UA: NEGATIVE
Specific Gravity, Urine: 1.014 (ref 1.005–1.030)
pH: 5.5 (ref 5.0–8.0)

## 2012-10-05 LAB — CBC
MCV: 79.5 fL (ref 78.0–100.0)
Platelets: 195 10*3/uL (ref 150–400)
RDW: 13.8 % (ref 11.5–15.5)
WBC: 5.3 10*3/uL (ref 4.0–10.5)

## 2012-10-05 LAB — BASIC METABOLIC PANEL
Calcium: 9.6 mg/dL (ref 8.4–10.5)
Creatinine, Ser: 0.86 mg/dL (ref 0.50–1.10)
GFR calc Af Amer: 80 mL/min — ABNORMAL LOW (ref >90)

## 2012-10-05 LAB — SURGICAL PCR SCREEN
MRSA, PCR: NEGATIVE
Staphylococcus aureus: NEGATIVE

## 2012-10-05 NOTE — Pre-Procedure Instructions (Signed)
[name]  10/05/2012   Your procedure is scheduled on: Tues, July 22nd.  Report to [facility] at 9:00 AM.  Call this number if you have problems the morning of surgery: [phone]   Remember:   Do not eat food or drink liquids after midnight.   Take these medicines the morning of surgery with A SIP OF WATER: gabapentin (NEURONTIN). Stop taking Aspirin, Coumadin, Plavix, Effient and Herbal medications,  ZO:XWRU OIL.  Do not take any NSAIDs ie: Ibuprofen,  Advil,Naproxen or any medication containing Aspirin.    Do not wear jewelry, make-up or nail polish.  Do not wear lotions, powders, or perfumes. You may wear deodorant.  Do not shave 48 hours prior to surgery.   Do not bring valuables to the hospital.  [facility] is not responsible for any belongings or valuables.  Contacts, dentures or bridgework may not be worn into surgery.  Leave suitcase in the car. After surgery it may be brought to your room.  For patients admitted to the hospital, checkout time is 11:00 AM the day of discharge.     Special Instructions: Shower using CHG 2 nights before surgery and the night before surgery.  If you shower the day of surgery use CHG.  Use special wash - you have one bottle of CHG for all showers.  You should use approximately 1/3 of the bottle for each shower.   Please read over the following fact sheets that you were given: Pain Booklet, Coughing and Deep Breathing, Blood Transfusion Information and Surgical Site Infection Prevention

## 2012-10-07 NOTE — Progress Notes (Signed)
Spoke with Cordelia Pen at Dr. Diamantina Providence office about needing Dr. August Saucer to sign the orders in [facility]. She states she will take care of it.

## 2012-10-08 ENCOUNTER — Other Ambulatory Visit: Payer: Self-pay | Admitting: Family Medicine

## 2012-10-12 MED ORDER — DEXTROSE 5 % IV SOLN
3.0000 g | INTRAVENOUS | Status: AC
  Administered 2012-10-13: 3 g via INTRAVENOUS
  Filled 2012-10-12: qty 3000

## 2012-10-13 ENCOUNTER — Encounter (HOSPITAL_COMMUNITY): Payer: Self-pay | Admitting: *Deleted

## 2012-10-13 ENCOUNTER — Encounter (HOSPITAL_COMMUNITY)
Admission: RE | Disposition: A | Discharge status: Home Health | Payer: Self-pay | Source: Ambulatory Visit | Attending: Orthopedic Surgery

## 2012-10-13 ENCOUNTER — Encounter (HOSPITAL_COMMUNITY): Payer: Self-pay | Admitting: Certified Registered Nurse Anesthetist

## 2012-10-13 ENCOUNTER — Inpatient Hospital Stay (HOSPITAL_COMMUNITY)
Admission: RE | Admit: 2012-10-13 | Discharge: 2012-10-16 | DRG: 470 | Disposition: A | Discharge status: Home Health | Payer: Medicare Other | Source: Ambulatory Visit | Attending: Orthopedic Surgery | Admitting: Orthopedic Surgery

## 2012-10-13 ENCOUNTER — Inpatient Hospital Stay (HOSPITAL_COMMUNITY): Payer: Medicare Other | Admitting: Certified Registered Nurse Anesthetist

## 2012-10-13 DIAGNOSIS — Z96659 Presence of unspecified artificial knee joint: Secondary | ICD-10-CM

## 2012-10-13 DIAGNOSIS — M25569 Pain in unspecified knee: Secondary | ICD-10-CM | POA: Diagnosis not present

## 2012-10-13 DIAGNOSIS — E7800 Pure hypercholesterolemia, unspecified: Secondary | ICD-10-CM | POA: Diagnosis not present

## 2012-10-13 DIAGNOSIS — Z7901 Long term (current) use of anticoagulants: Secondary | ICD-10-CM

## 2012-10-13 DIAGNOSIS — Z6841 Body Mass Index (BMI) 40.0 and over, adult: Secondary | ICD-10-CM

## 2012-10-13 DIAGNOSIS — Z79899 Other long term (current) drug therapy: Secondary | ICD-10-CM

## 2012-10-13 DIAGNOSIS — G8918 Other acute postprocedural pain: Secondary | ICD-10-CM | POA: Diagnosis not present

## 2012-10-13 DIAGNOSIS — IMO0002 Reserved for concepts with insufficient information to code with codable children: Secondary | ICD-10-CM | POA: Diagnosis not present

## 2012-10-13 DIAGNOSIS — M1710 Unilateral primary osteoarthritis, unspecified knee: Principal | ICD-10-CM | POA: Diagnosis present

## 2012-10-13 DIAGNOSIS — I10 Essential (primary) hypertension: Secondary | ICD-10-CM | POA: Diagnosis not present

## 2012-10-13 HISTORY — PX: TOTAL KNEE ARTHROPLASTY: SHX125

## 2012-10-13 LAB — URINALYSIS, ROUTINE W REFLEX MICROSCOPIC
Bilirubin Urine: NEGATIVE
Leukocytes, UA: NEGATIVE
Nitrite: NEGATIVE
Specific Gravity, Urine: 1.024 (ref 1.005–1.030)
pH: 6.5 (ref 5.0–8.0)

## 2012-10-13 SURGERY — ARTHROPLASTY, KNEE, TOTAL
Anesthesia: General | Site: Knee | Laterality: Left | Wound class: Clean

## 2012-10-13 MED ORDER — FENTANYL CITRATE 0.05 MG/ML IJ SOLN
INTRAMUSCULAR | Status: DC | PRN
  Administered 2012-10-13 (×2): 100 ug via INTRAVENOUS
  Administered 2012-10-13: 25 ug via INTRAVENOUS
  Administered 2012-10-13: 100 ug via INTRAVENOUS
  Administered 2012-10-13: 50 ug via INTRAVENOUS
  Administered 2012-10-13: 100 ug via INTRAVENOUS
  Administered 2012-10-13 (×2): 25 ug via INTRAVENOUS
  Administered 2012-10-13: 50 ug via INTRAVENOUS
  Administered 2012-10-13: 25 ug via INTRAVENOUS

## 2012-10-13 MED ORDER — DIPHENHYDRAMINE HCL 12.5 MG/5ML PO ELIX: 12.5000 mg | ORAL_SOLUTION | Freq: Four times a day (QID) | ORAL | Status: DC | PRN

## 2012-10-13 MED ORDER — METOCLOPRAMIDE HCL 5 MG/ML IJ SOLN
INTRAMUSCULAR | Status: DC | PRN
  Administered 2012-10-13: 5 mg via INTRAVENOUS

## 2012-10-13 MED ORDER — ARTIFICIAL TEARS OP OINT
TOPICAL_OINTMENT | OPHTHALMIC | Status: DC | PRN
  Administered 2012-10-13: 1 via OPHTHALMIC

## 2012-10-13 MED ORDER — MORPHINE SULFATE (PF) 1 MG/ML IV SOLN
INTRAVENOUS | Status: DC
  Administered 2012-10-13: 23 mg via INTRAVENOUS
  Administered 2012-10-14: 31.52 mg via INTRAVENOUS
  Administered 2012-10-14: 01:00:00 via INTRAVENOUS
  Administered 2012-10-14: 9.66 mg via INTRAVENOUS
  Administered 2012-10-14: 02:00:00 via INTRAVENOUS
  Filled 2012-10-13 (×2): qty 25

## 2012-10-13 MED ORDER — MORPHINE SULFATE (PF) 1 MG/ML IV SOLN
INTRAVENOUS | Status: AC
  Filled 2012-10-13: qty 25

## 2012-10-13 MED ORDER — GLYCOPYRROLATE 0.2 MG/ML IJ SOLN
INTRAMUSCULAR | Status: DC | PRN
  Administered 2012-10-13: 0.6 mg via INTRAVENOUS

## 2012-10-13 MED ORDER — DIPHENHYDRAMINE HCL 50 MG/ML IJ SOLN: 12.5000 mg | Freq: Four times a day (QID) | INTRAMUSCULAR | Status: DC | PRN

## 2012-10-13 MED ORDER — METOCLOPRAMIDE HCL 10 MG PO TABS
5.0000 mg | ORAL_TABLET | Freq: Three times a day (TID) | ORAL | Status: DC | PRN
  Administered 2012-10-14: 10 mg via ORAL
  Filled 2012-10-13: qty 1

## 2012-10-13 MED ORDER — LIDOCAINE HCL (CARDIAC) 20 MG/ML IV SOLN
INTRAVENOUS | Status: DC | PRN
  Administered 2012-10-13: 40 mg via INTRAVENOUS

## 2012-10-13 MED ORDER — OXYCODONE HCL 5 MG PO TABS
5.0000 mg | ORAL_TABLET | ORAL | Status: DC | PRN
  Administered 2012-10-14 – 2012-10-16 (×12): 10 mg via ORAL
  Filled 2012-10-13 (×10): qty 2

## 2012-10-13 MED ORDER — ALBUTEROL SULFATE HFA 108 (90 BASE) MCG/ACT IN AERS
INHALATION_SPRAY | RESPIRATORY_TRACT | Status: DC | PRN
  Administered 2012-10-13 (×2): 2 via RESPIRATORY_TRACT

## 2012-10-13 MED ORDER — 0.9 % SODIUM CHLORIDE (POUR BTL) OPTIME
TOPICAL | Status: DC | PRN
  Administered 2012-10-13 (×3): 1000 mL

## 2012-10-13 MED ORDER — COUMADIN BOOK
Freq: Once | Status: AC
  Administered 2012-10-13: 18:00:00
  Filled 2012-10-13: qty 1

## 2012-10-13 MED ORDER — HYDROCHLOROTHIAZIDE 25 MG PO TABS
25.0000 mg | ORAL_TABLET | Freq: Every day | ORAL | Status: DC
  Administered 2012-10-13 – 2012-10-15 (×2): 25 mg via ORAL
  Filled 2012-10-13 (×4): qty 1

## 2012-10-13 MED ORDER — CHLORHEXIDINE GLUCONATE 4 % EX LIQD: 60.0000 mL | Freq: Once | CUTANEOUS | Status: DC

## 2012-10-13 MED ORDER — ONDANSETRON HCL 4 MG/2ML IJ SOLN: 4.0000 mg | Freq: Four times a day (QID) | INTRAMUSCULAR | Status: DC | PRN

## 2012-10-13 MED ORDER — WARFARIN - PHARMACIST DOSING INPATIENT: Freq: Every day | Status: DC

## 2012-10-13 MED ORDER — DEXAMETHASONE SODIUM PHOSPHATE 4 MG/ML IJ SOLN
INTRAMUSCULAR | Status: DC | PRN
  Administered 2012-10-13: 4 mg via INTRAVENOUS

## 2012-10-13 MED ORDER — METOCLOPRAMIDE HCL 5 MG/ML IJ SOLN: 5.0000 mg | Freq: Three times a day (TID) | INTRAMUSCULAR | Status: DC | PRN

## 2012-10-13 MED ORDER — MIDAZOLAM HCL 5 MG/5ML IJ SOLN
INTRAMUSCULAR | Status: DC | PRN
  Administered 2012-10-13: 2 mg via INTRAVENOUS

## 2012-10-13 MED ORDER — GABAPENTIN 100 MG PO CAPS
100.0000 mg | ORAL_CAPSULE | Freq: Three times a day (TID) | ORAL | Status: DC
  Administered 2012-10-13 – 2012-10-15 (×8): 100 mg via ORAL
  Filled 2012-10-13 (×12): qty 1

## 2012-10-13 MED ORDER — CEFAZOLIN SODIUM-DEXTROSE 2-3 GM-% IV SOLR
2.0000 g | Freq: Three times a day (TID) | INTRAVENOUS | Status: AC
  Administered 2012-10-13 (×2): 2 g via INTRAVENOUS
  Filled 2012-10-13 (×2): qty 50

## 2012-10-13 MED ORDER — NEOSTIGMINE METHYLSULFATE 1 MG/ML IJ SOLN
INTRAMUSCULAR | Status: DC | PRN
  Administered 2012-10-13: 4 mg via INTRAVENOUS

## 2012-10-13 MED ORDER — ACETAMINOPHEN 650 MG RE SUPP: 650.0000 mg | Freq: Four times a day (QID) | RECTAL | Status: DC | PRN

## 2012-10-13 MED ORDER — PHENOL 1.4 % MT LIQD: 1.0000 | OROMUCOSAL | Status: DC | PRN

## 2012-10-13 MED ORDER — PROPOFOL 10 MG/ML IV BOLUS
INTRAVENOUS | Status: DC | PRN
  Administered 2012-10-13: 200 mg via INTRAVENOUS

## 2012-10-13 MED ORDER — ACETAMINOPHEN 325 MG PO TABS: 650.0000 mg | ORAL_TABLET | Freq: Four times a day (QID) | ORAL | Status: DC | PRN

## 2012-10-13 MED ORDER — RAMIPRIL 5 MG PO CAPS
5.0000 mg | ORAL_CAPSULE | Freq: Every day | ORAL | Status: DC
  Administered 2012-10-13 – 2012-10-15 (×2): 5 mg via ORAL
  Filled 2012-10-13 (×4): qty 1

## 2012-10-13 MED ORDER — SODIUM CHLORIDE 0.9 % IJ SOLN: 9.0000 mL | INTRAMUSCULAR | Status: DC | PRN

## 2012-10-13 MED ORDER — WARFARIN SODIUM 7.5 MG PO TABS
7.5000 mg | ORAL_TABLET | Freq: Once | ORAL | Status: AC
  Administered 2012-10-13: 7.5 mg via ORAL
  Filled 2012-10-13: qty 1

## 2012-10-13 MED ORDER — VECURONIUM BROMIDE 10 MG IV SOLR
INTRAVENOUS | Status: DC | PRN
  Administered 2012-10-13: 5 mg via INTRAVENOUS

## 2012-10-13 MED ORDER — LACTATED RINGERS IV SOLN
INTRAVENOUS | Status: DC
  Administered 2012-10-13: 10:00:00 via INTRAVENOUS

## 2012-10-13 MED ORDER — SODIUM CHLORIDE 0.9 % IJ SOLN
INTRAMUSCULAR | Status: DC | PRN
  Administered 2012-10-13: 14:00:00

## 2012-10-13 MED ORDER — PROMETHAZINE HCL 25 MG/ML IJ SOLN
6.2500 mg | INTRAMUSCULAR | Status: DC | PRN
  Administered 2012-10-13: 6.25 mg via INTRAVENOUS

## 2012-10-13 MED ORDER — METHOCARBAMOL 500 MG PO TABS
500.0000 mg | ORAL_TABLET | Freq: Four times a day (QID) | ORAL | Status: DC | PRN
  Administered 2012-10-13 – 2012-10-16 (×6): 500 mg via ORAL
  Filled 2012-10-13 (×6): qty 1

## 2012-10-13 MED ORDER — CALCIUM CARBONATE-VITAMIN D 500-200 MG-UNIT PO TABS
1.0000 | ORAL_TABLET | Freq: Every day | ORAL | Status: DC
  Administered 2012-10-13 – 2012-10-15 (×3): 1 via ORAL
  Filled 2012-10-13 (×4): qty 1

## 2012-10-13 MED ORDER — ONDANSETRON HCL 4 MG/2ML IJ SOLN
INTRAMUSCULAR | Status: DC | PRN
  Administered 2012-10-13: 4 mg via INTRAVENOUS

## 2012-10-13 MED ORDER — ONDANSETRON HCL 4 MG PO TABS
4.0000 mg | ORAL_TABLET | Freq: Four times a day (QID) | ORAL | Status: DC | PRN
  Administered 2012-10-14: 4 mg via ORAL
  Filled 2012-10-13: qty 1

## 2012-10-13 MED ORDER — ROCURONIUM BROMIDE 100 MG/10ML IV SOLN
INTRAVENOUS | Status: DC | PRN
  Administered 2012-10-13: 50 mg via INTRAVENOUS

## 2012-10-13 MED ORDER — LACTATED RINGERS IV SOLN
INTRAVENOUS | Status: DC | PRN
  Administered 2012-10-13 (×3): via INTRAVENOUS

## 2012-10-13 MED ORDER — FENTANYL CITRATE 0.05 MG/ML IJ SOLN
INTRAMUSCULAR | Status: AC
  Filled 2012-10-13: qty 2

## 2012-10-13 MED ORDER — NALOXONE HCL 0.4 MG/ML IJ SOLN: 0.4000 mg | INTRAMUSCULAR | Status: DC | PRN

## 2012-10-13 MED ORDER — MIDAZOLAM HCL 5 MG/ML IJ SOLN: 2.0000 mg | Freq: Once | INTRAMUSCULAR | Status: DC

## 2012-10-13 MED ORDER — HYDROMORPHONE HCL PF 1 MG/ML IJ SOLN: 0.2500 mg | INTRAMUSCULAR | Status: DC | PRN

## 2012-10-13 MED ORDER — POTASSIUM CHLORIDE IN NACL 20-0.9 MEQ/L-% IV SOLN
INTRAVENOUS | Status: DC
  Filled 2012-10-13 (×2): qty 1000

## 2012-10-13 MED ORDER — MIDAZOLAM HCL 2 MG/2ML IJ SOLN
INTRAMUSCULAR | Status: AC
  Administered 2012-10-13: 1 mg
  Filled 2012-10-13: qty 2

## 2012-10-13 MED ORDER — SODIUM CHLORIDE 0.9 % IR SOLN
Status: DC | PRN
  Administered 2012-10-13: 3000 mL

## 2012-10-13 MED ORDER — LIDOCAINE HCL 4 % MT SOLN
OROMUCOSAL | Status: DC | PRN
  Administered 2012-10-13: 4 mL via TOPICAL

## 2012-10-13 MED ORDER — ONDANSETRON HCL 4 MG/2ML IJ SOLN: 4.0000 mg | Freq: Once | INTRAMUSCULAR | Status: DC | PRN

## 2012-10-13 MED ORDER — WARFARIN VIDEO: Freq: Once | Status: DC

## 2012-10-13 MED ORDER — HYDROMORPHONE HCL PF 1 MG/ML IJ SOLN
INTRAMUSCULAR | Status: AC
  Administered 2012-10-13: 0.5 mg via INTRAVENOUS
  Filled 2012-10-13: qty 1

## 2012-10-13 MED ORDER — METHOCARBAMOL 100 MG/ML IJ SOLN
500.0000 mg | Freq: Four times a day (QID) | INTRAMUSCULAR | Status: DC | PRN
  Filled 2012-10-13: qty 5

## 2012-10-13 MED ORDER — MENTHOL 3 MG MT LOZG: 1.0000 | LOZENGE | OROMUCOSAL | Status: DC | PRN

## 2012-10-13 MED ORDER — FENTANYL CITRATE 0.05 MG/ML IJ SOLN
100.0000 ug | Freq: Once | INTRAMUSCULAR | Status: AC
  Administered 2012-10-13: 100 ug via INTRAVENOUS

## 2012-10-13 MED ORDER — RAMIPRIL 2.5 MG PO CAPS: 2.5000 mg | ORAL_CAPSULE | Freq: Every day | ORAL | Status: DC

## 2012-10-13 MED ORDER — DOCUSATE SODIUM 100 MG PO CAPS
100.0000 mg | ORAL_CAPSULE | Freq: Two times a day (BID) | ORAL | Status: DC
  Administered 2012-10-13 – 2012-10-15 (×5): 100 mg via ORAL
  Filled 2012-10-13 (×6): qty 1

## 2012-10-13 MED ORDER — BUPIVACAINE LIPOSOME 1.3 % IJ SUSP
20.0000 mL | INTRAMUSCULAR | Status: DC
  Filled 2012-10-13: qty 20

## 2012-10-13 SURGICAL SUPPLY — 89 items
BANDAGE ELASTIC 4 VELCRO ST LF (GAUZE/BANDAGES/DRESSINGS) ×2 IMPLANT
BANDAGE ESMARK 6X9 LF (GAUZE/BANDAGES/DRESSINGS) ×1 IMPLANT
BLADE SAG 18X100X1.27 (BLADE) ×2 IMPLANT
BLADE SAW SGTL 13.0X1.19X90.0M (BLADE) ×2 IMPLANT
BLADE SURG 10 STRL SS (BLADE) ×1 IMPLANT
BNDG CMPR 9X6 STRL LF SNTH (GAUZE/BANDAGES/DRESSINGS) ×1
BNDG CMPR MED 10X6 ELC LF (GAUZE/BANDAGES/DRESSINGS) ×2
BNDG COHESIVE 6X5 TAN STRL LF (GAUZE/BANDAGES/DRESSINGS) ×3 IMPLANT
BNDG ELASTIC 6X10 VLCR STRL LF (GAUZE/BANDAGES/DRESSINGS) ×5 IMPLANT
BNDG ESMARK 6X9 LF (GAUZE/BANDAGES/DRESSINGS) ×2
BOWL SMART MIX CTS (DISPOSABLE) ×2 IMPLANT
CEMENT BONE SIMPLEX SPEEDSET (Cement) ×2 IMPLANT
CLOTH BEACON ORANGE TIMEOUT ST (SAFETY) ×2 IMPLANT
COVER SURGICAL LIGHT HANDLE (MISCELLANEOUS) ×2 IMPLANT
CUFF TOURNIQUET SINGLE 34IN LL (TOURNIQUET CUFF) IMPLANT
CUFF TOURNIQUET SINGLE 44IN (TOURNIQUET CUFF) ×1 IMPLANT
DRAPE INCISE IOBAN 66X45 STRL (DRAPES) ×1 IMPLANT
DRAPE ORTHO SPLIT 77X108 STRL (DRAPES) ×6
DRAPE SURG ORHT 6 SPLT 77X108 (DRAPES) ×3 IMPLANT
DRAPE U-SHAPE 47X51 STRL (DRAPES) ×2 IMPLANT
DRAPE X-RAY CASS 24X20 (DRAPES) IMPLANT
DRSG PAD ABDOMINAL 8X10 ST (GAUZE/BANDAGES/DRESSINGS) ×4 IMPLANT
DURAPREP 26ML APPLICATOR (WOUND CARE) ×3 IMPLANT
ELECT REM PT RETURN 9FT ADLT (ELECTROSURGICAL) ×2
ELECTRODE REM PT RTRN 9FT ADLT (ELECTROSURGICAL) ×1 IMPLANT
EVACUATOR 1/8 PVC DRAIN (DRAIN) ×1 IMPLANT
FACESHIELD LNG OPTICON STERILE (SAFETY) ×1 IMPLANT
GAUZE XEROFORM 5X9 LF (GAUZE/BANDAGES/DRESSINGS) ×2 IMPLANT
GLOVE BIOGEL PI IND STRL 7.0 (GLOVE) IMPLANT
GLOVE BIOGEL PI IND STRL 7.5 (GLOVE) ×1 IMPLANT
GLOVE BIOGEL PI IND STRL 8 (GLOVE) ×1 IMPLANT
GLOVE BIOGEL PI INDICATOR 7.0 (GLOVE) ×3
GLOVE BIOGEL PI INDICATOR 7.5 (GLOVE) ×1
GLOVE BIOGEL PI INDICATOR 8 (GLOVE) ×1
GLOVE ECLIPSE 7.0 STRL STRAW (GLOVE) ×4 IMPLANT
GLOVE SS BIOGEL STRL SZ 7 (GLOVE) IMPLANT
GLOVE SUPERSENSE BIOGEL SZ 7 (GLOVE) ×1
GLOVE SURG ORTHO 8.0 STRL STRW (GLOVE) ×2 IMPLANT
GLOVE SURG SS PI 6.5 STRL IVOR (GLOVE) ×1 IMPLANT
GOWN PREVENTION PLUS LG XLONG (DISPOSABLE) ×1 IMPLANT
GOWN PREVENTION PLUS XLARGE (GOWN DISPOSABLE) ×2 IMPLANT
GOWN STRL NON-REIN LRG LVL3 (GOWN DISPOSABLE) ×3 IMPLANT
GOWN STRL REIN XL XLG (GOWN DISPOSABLE) ×2 IMPLANT
HANDPIECE INTERPULSE COAX TIP (DISPOSABLE) ×2
HOOD PEEL AWAY FACE SHEILD DIS (HOOD) ×7 IMPLANT
IMMOBILIZER KNEE 20 (SOFTGOODS)
IMMOBILIZER KNEE 20 THIGH 36 (SOFTGOODS) IMPLANT
IMMOBILIZER KNEE 22 UNIV (SOFTGOODS) ×2 IMPLANT
IMMOBILIZER KNEE 24 THIGH 36 (MISCELLANEOUS) IMPLANT
IMMOBILIZER KNEE 24 UNIV (MISCELLANEOUS)
KIT BASIN OR (CUSTOM PROCEDURE TRAY) ×2 IMPLANT
KIT ROOM TURNOVER OR (KITS) ×2 IMPLANT
KNEE LEVEL 1C ×1 IMPLANT
MANIFOLD NEPTUNE II (INSTRUMENTS) ×2 IMPLANT
MARKER SPHERE PSV REFLC THRD 5 (MARKER) IMPLANT
NDL 18GX1X1/2 (RX/OR ONLY) (NEEDLE) ×1 IMPLANT
NDL SPNL 18GX3.5 QUINCKE PK (NEEDLE) ×1 IMPLANT
NEEDLE 18GX1X1/2 (RX/OR ONLY) (NEEDLE) ×2 IMPLANT
NEEDLE 22X1 1/2 (OR ONLY) (NEEDLE) ×1 IMPLANT
NEEDLE SPNL 18GX3.5 QUINCKE PK (NEEDLE) ×2 IMPLANT
NS IRRIG 1000ML POUR BTL (IV SOLUTION) ×2 IMPLANT
PACK TOTAL JOINT (CUSTOM PROCEDURE TRAY) ×2 IMPLANT
PAD ARMBOARD 7.5X6 YLW CONV (MISCELLANEOUS) ×3 IMPLANT
PAD CAST 4YDX4 CTTN HI CHSV (CAST SUPPLIES) ×1 IMPLANT
PADDING CAST COTTON 4X4 STRL (CAST SUPPLIES) ×2
PADDING CAST COTTON 6X4 STRL (CAST SUPPLIES) ×5 IMPLANT
PIN SCHANZ 4MM 130MM (PIN) IMPLANT
RUBBERBAND STERILE (MISCELLANEOUS) ×1 IMPLANT
SET HNDPC FAN SPRY TIP SCT (DISPOSABLE) ×1 IMPLANT
SPONGE GAUZE 4X4 12PLY (GAUZE/BANDAGES/DRESSINGS) ×2 IMPLANT
SPONGE LAP 18X18 X RAY DECT (DISPOSABLE) IMPLANT
STAPLER VISISTAT 35W (STAPLE) ×2 IMPLANT
STEM CEMENTED (Stem) ×1 IMPLANT
SUCTION FRAZIER TIP 10 FR DISP (SUCTIONS) ×2 IMPLANT
SUT ETHILON 3 0 PS 1 (SUTURE) ×2 IMPLANT
SUT VIC AB 0 CT1 27 (SUTURE) ×4
SUT VIC AB 0 CT1 27XBRD ANBCTR (SUTURE) IMPLANT
SUT VIC AB 0 CTB1 27 (SUTURE) ×6 IMPLANT
SUT VIC AB 1 CT1 27 (SUTURE) ×10
SUT VIC AB 1 CT1 27XBRD ANBCTR (SUTURE) ×5 IMPLANT
SUT VIC AB 2-0 CT1 27 (SUTURE) ×4
SUT VIC AB 2-0 CT1 TAPERPNT 27 (SUTURE) ×2 IMPLANT
SYR 20CC LL (SYRINGE) ×2 IMPLANT
SYR 30ML SLIP (SYRINGE) ×2 IMPLANT
SYR TB 1ML LUER SLIP (SYRINGE) ×1 IMPLANT
TOWEL OR 17X24 6PK STRL BLUE (TOWEL DISPOSABLE) ×2 IMPLANT
TOWEL OR 17X26 10 PK STRL BLUE (TOWEL DISPOSABLE) ×4 IMPLANT
TRAY FOLEY CATH 16FRSI W/METER (SET/KITS/TRAYS/PACK) ×2 IMPLANT
WATER STERILE IRR 1000ML POUR (IV SOLUTION) ×4 IMPLANT

## 2012-10-13 NOTE — Progress Notes (Signed)
Orthopedic Tech Progress Note Patient Details:  [name] January 04, 1946 [id number]  CPM Left Knee Left Knee Flexion (Degrees): 40 Left Knee Extension (Degrees): 0 Additional Comments: trapeze bar   Shawnie Pons 10/13/2012, 3:59 PM

## 2012-10-13 NOTE — Progress Notes (Signed)
ANTICOAGULATION CONSULT NOTE - Initial Consult  Pharmacy Consult for Coumadin Indication: VTE prophylaxis  No Known Allergies  Patient Measurements: Height: 5\' 3"  (160 cm) Weight: 297 lb (134.718 kg) IBW/kg (Calculated) : 52.4 Heparin Dosing Weight:   Vital Signs: Temp: 97.8 F (36.6 C) (07/22 1600) Temp src: Oral (07/22 0923) BP: 126/65 mmHg (07/22 1600) Pulse Rate: 64 (07/22 1600)  Labs: No results found for this basename: HGB, HCT, PLT, APTT, LABPROT, INR, HEPARINUNFRC, CREATININE, CKTOTAL, CKMB, TROPONINI,  in the last 72 hours  Estimated Creatinine Clearance: 86.7 ml/min (by C-G formula based on Cr of 0.86).   Medical History: Past Medical History  Diagnosis Date  . Hypertension   . PONV (postoperative nausea and vomiting)   . Arthritis     "both knees" (07/07/2012)    Medications:  Scheduled:  . calcium-vitamin D  1 tablet Oral Daily  .  ceFAZolin (ANCEF) IV  2 g Intravenous Q8H  . docusate sodium  100 mg Oral BID  . fentaNYL      . gabapentin  100 mg Oral TID  . hydrochlorothiazide  25 mg Oral Daily  . morphine   Intravenous Q4H  . ramipril  5 mg Oral Daily    Assessment: 67 yr old female admitted for Total Knee today. Pharmacy to dose coumadin for VTE prophylaxis. Goal of Therapy:  Coumadin 7.5 mg x 1 tonight. Monitor platelets by anticoagulation protocol: Yes   Plan:  Will give 7.5 mg Coumadin tonight. F/u AM INR. Ordered book and video for patient. Daily PT/INR  Eugene Garnet 10/13/2012,5:15 PM

## 2012-10-13 NOTE — Preoperative (Signed)
Beta Blockers   Reason not to administer Beta Blockers:Not Applicable 

## 2012-10-13 NOTE — Progress Notes (Signed)
Report given to Kathy RN

## 2012-10-13 NOTE — Progress Notes (Signed)
Received from report from Kirstie Mirza N at bedside for care of patient

## 2012-10-13 NOTE — Op Note (Signed)
[name], [name] NO.:  [id number]  MEDICAL RECORD NO.:  [id number]  LOCATION:  5N30C                        FACILITY:  [facility]  PHYSICIAN:  Burnard Bunting, M.D.    DATE OF BIRTH:  24-Jul-1945  DATE OF PROCEDURE: DATE OF DISCHARGE:                              OPERATIVE REPORT   PREOPERATIVE DIAGNOSIS:  Left knee arthritis.  POSTOPERATIVE DIAGNOSIS:  Left knee arthritis.  PROCEDURE:  Left total knee replacement.  SURGEONS:  Burnard Bunting, M.D.  ASSIST:  Wende Neighbors, P.A.  ANESTHESIA:  General endotracheal anesthesia.  ESTIMATED BLOOD LOSS:  100 mL.  TOURNIQUET TIME:  122 at 325 mmHg.  INDICATIONS:  [name] is a patient with left knee arthritis presents for operative management after explanation risks and benefits. She had good result with a right total knee replacement.  IMPLANT UTILIZED:  Stryker 5 femur, 4 tibia with 100 mm stem, 9 mm polyethylene insert, 29 mm 3 PEG patella, all posterior cruciate sacrificing.  PROCEDURE IN DETAIL:  The patient was brought to operating room, where general endotracheal anesthesia was introduced.  Preoperative antibiotics were induced.  Time-out was called.  Left leg was prescribed with alcohol and Betadine, after sterile prep, prepped with DuraPrep solution and draped in a sterile manner.  Ioban sheet used to cover the operative field.  Leg was elevated and exsanguinated with an Esmarch wrap.  Tourniquet was inflated.  Anterior approach to knee was made. Skin and subcu tissue were sharply divided.  Median parapatellar approach was made, precise location marked with #1 Vicryl suture.  Soft tissue was elevated off the anterior distal femur.  Osteophytes were removed.  ACL and PCL released.  Lateral patellofemoral ligament removed.  Medial soft tissue release was required because of the severe varus.  This required release of the superficial MCL off the tibia as well as the semimembranosus and posterior  oblique ligament.  Tibial cut was made, 12 mm off the least affected at the tibial side, which did clear 95% of the medial tibial plateau defect from there.  At this time, a 12 mm cut was made off the distal femur.  This allowed a 9-mm spacer to fit with further soft tissue release on the medial side.  The chamfer cuts and box cuts were made with the collaterals protected.  At this time, tibia was keel punched and prepared to accept the 100 mm stem for added stability.  Patella was then cut from 23 to 13, and a 29-mm 3 PEG patella was placed.  After placement of trial components, the patient achieved near full extension, had excellent patellar tracking, and a good flexion past 90 degrees.  She was stable to varus and valgus stress at 0, 30, and 90 degrees.  Trial implants were removed.  True implants placed, excess cement removed.  The same stability parameters were maintained.  Exparel was injected into the capsule.  Incision was then thoroughly irrigated.  Tourniquet was released.  Bleeding points encountered, were closed with electrocautery.  Thorough irrigation again performed, and the knee was then closed using  interrupted inverted #1 Vicryl suture, 0 Vicryl suture, and 2-0 Vicryl suture and skin staples. Velna Hatchet Vernon's  assistance was required at all times during the case for retraction and limb positioning.  Her assistance was a medical necessity.  The knee was then wrapped with a bulky wrap and knee immobilizer.  The patient tolerated the procedure well without immediate complications, transferred to recovery room in stable condition.     Burnard Bunting, M.D.     GSD/MEDQ  D:  10/13/2012  T:  10/13/2012  Job:  [id number]

## 2012-10-13 NOTE — H&P (Addendum)
TOTAL KNEE ADMISSION H&P  Patient is being admitted for left total knee arthroplasty.  Subjective:  Chief Complaint:left knee pain.  HPI: [name], 67 y.o. female, has a history of pain and functional disability in the left knee due to arthritis and has failed non-surgical conservative treatments for greater than 12 weeks to includeNSAID's and/or analgesics, corticosteriod injections, viscosupplementation injections, use of assistive devices, weight reduction as appropriate and activity modification.  Onset of symptoms was gradual, starting >10 years ago with gradually worsening course since that time. The patient noted no past surgery on the left knee(s).  Patient currently rates pain in the left knee(s) at 7 out of 10 with activity. Patient has night pain, worsening of pain with activity and weight bearing, pain that interferes with activities of daily living, crepitus and joint swelling.  Patient has evidence of subchondral sclerosis, periarticular osteophytes, joint subluxation and joint space narrowing by imaging studies. This patient has had a good result from her right TKA. There is no active infection.  Patient Active Problem List   Diagnosis Date Noted  . Routine general medical examination at a health care facility 08/21/2010  . SCIATICA 07/18/2009  . KNEE PAIN, BILATERAL 05/01/2009  . PURE HYPERCHOLESTEROLEMIA 02/08/2008  . HYPERGLYCEMIA 12/19/2006  . OBESITY, MORBID 07/10/2006  . HYPERTENSION 09/06/2005   Past Medical History  Diagnosis Date  . Hypertension   . PONV (postoperative nausea and vomiting)   . Arthritis     "both knees" (07/07/2012)    Past Surgical History  Procedure Laterality Date  . Vaginal delivery      X3  . Total knee arthroplasty Right 07/07/2012  . Joint replacement    . Total knee arthroplasty Right 07/07/2012    Procedure: TOTAL KNEE ARTHROPLASTY;  Surgeon: Cammy Copa, MD;  Location: [facility] OR;  Service: Orthopedics;  Laterality: Right;   Right Total Knee Arthroplasty    Prescriptions prior to admission  Medication Sig Dispense Refill  . Calcium Carbonate-Vitamin D (CALCIUM 600+D) 600-400 MG-UNIT per tablet Take 1 tablet by mouth daily.        . fish oil-omega-3 fatty acids 1000 MG capsule Take 1 g by mouth daily.      Marland Kitchen gabapentin (NEURONTIN) 100 MG capsule Take 1 capsule (100 mg total) by mouth 3 (three) times daily.  270 capsule  0  . hydrochlorothiazide (HYDRODIURIL) 25 MG tablet Take 1 tablet (25 mg total) by mouth daily.  90 tablet  0  . ramipril (ALTACE) 5 MG capsule Take 5 mg by mouth daily.      . ramipril (ALTACE) 5 MG capsule TAKE 1 CAPSULE (5 MG TOTAL) BY MOUTH DAILY.  90 capsule  1   No Known Allergies  History  Substance Use Topics  . Smoking status: Never Smoker   . Smokeless tobacco: Never Used  . Alcohol Use: No    History reviewed. No pertinent family history.   Review of Systems  Constitutional: Negative.   HENT: Negative.   Eyes: Negative.   Respiratory: Negative.   Cardiovascular: Negative.   Gastrointestinal: Negative.   Genitourinary: Negative.   Musculoskeletal: Positive for joint pain.  Skin: Negative.   Neurological: Negative.   Endo/Heme/Allergies: Negative.   Psychiatric/Behavioral: Negative.     Objective:  Physical Exam  Constitutional: She appears well-developed.  HENT:  Head: Normocephalic.  Eyes: Pupils are equal, round, and reactive to light.  Neck: Normal range of motion.  Cardiovascular: Normal rate.   Respiratory: Effort normal.  GI: Soft.  Neurological:  She is alert.  Skin: Skin is warm.  Psychiatric: She has a normal mood and affect.  left knee 1/4 dp - some venous stasis changes - varus alignment - extensor mech ok - rom 15 - 85 - skin intact  Vital signs in last 24 hours: Temp:  [97.1 F (36.2 C)] 97.1 F (36.2 C) (07/22 0923) Pulse Rate:  [73-80] 77 (07/22 0958) Resp:  [20] 20 (07/22 0923) BP: (156)/(74) 156/74 mmHg (07/22 0923) SpO2:  [97 %-100 %] 100  % (07/22 0958) Weight:  [134.718 kg (297 lb)] 134.718 kg (297 lb) (07/22 0931)  Labs:   Estimated body mass index is 52.62 kg/(m^2) as calculated from the following:   Height as of this encounter: 5\' 3"  (1.6 m).   Weight as of this encounter: 134.718 kg (297 lb).   Imaging Review Plain radiographs demonstrate severe degenerative joint disease of the left knee(s). The overall alignment issignificant varus. The bone quality appears to be good for age and reported activity level.  Assessment/Plan:  End stage arthritis, left knee   The patient history, physical examination, clinical judgment of the provider and imaging studies are consistent with end stage degenerative joint disease of the left knee(s) and total knee arthroplasty is deemed medically necessary. The treatment options including medical management, injection therapy arthroscopy and arthroplasty were discussed at length. The risks and benefits of total knee arthroplasty were presented and reviewed. The risks due to aseptic loosening, infection, stiffness, patella tracking problems, thromboembolic complications and other imponderables were discussed. The patient acknowledged the explanation, agreed to proceed with the plan and consent was signed. Patient is being admitted for inpatient treatment for surgery, pain control, PT, OT, prophylactic antibiotics, VTE prophylaxis, progressive ambulation and ADL's and discharge planning. The patient is planning to be discharged home with home health services Plan for similar implant size and cuts for left TKA as R tka

## 2012-10-13 NOTE — Transfer of Care (Signed)
Immediate Anesthesia Transfer of Care Note  Patient: [name]  Procedure(s) Performed: Procedure(s): TOTAL KNEE ARTHROPLASTY (Left)  Patient Location: PACU  Anesthesia Type:General  Level of Consciousness: awake, alert  and oriented  Airway & Oxygen Therapy: Patient Spontanous Breathing and Patient connected to face mask oxygen  Post-op Assessment: Report given to PACU RN, Post -op Vital signs reviewed and stable and Patient moving all extremities X 4  Post vital signs: Reviewed and stable  Complications: No apparent anesthesia complications

## 2012-10-13 NOTE — Anesthesia Preprocedure Evaluation (Signed)
Anesthesia Evaluation  Patient identified by MRN, date of birth, ID band Patient awake    Reviewed: Allergy & Precautions, H&P , NPO status , Patient's Chart, lab work & pertinent test results  Airway Mallampati: II      Dental  (+) Edentulous Upper and Edentulous Lower   Pulmonary  breath sounds clear to auscultation        Cardiovascular Rhythm:Regular Rate:Normal     Neuro/Psych    GI/Hepatic   Endo/Other    Renal/GU      Musculoskeletal   Abdominal (+) + obese,   Peds  Hematology   Anesthesia Other Findings   Reproductive/Obstetrics                           Anesthesia Physical Anesthesia Plan  ASA: III  Anesthesia Plan: General   Post-op Pain Management:    Induction: Intravenous  Airway Management Planned: Oral ETT  Additional Equipment:   Intra-op Plan:   Post-operative Plan: Extubation in OR  Informed Consent: I have reviewed the patients History and Physical, chart, labs and discussed the procedure including the risks, benefits and alternatives for the proposed anesthesia with the patient or authorized representative who has indicated his/her understanding and acceptance.     Plan Discussed with: CRNA and Anesthesiologist  Anesthesia Plan Comments:         Anesthesia Quick Evaluation

## 2012-10-13 NOTE — Anesthesia Postprocedure Evaluation (Signed)
Anesthesia Post Note  Patient: [name]  Procedure(s) Performed: Procedure(s) (LRB): TOTAL KNEE ARTHROPLASTY (Left)  Anesthesia type: general  Patient location: PACU  Post pain: Pain level controlled  Post assessment: Patient's Cardiovascular Status Stable  Last Vitals:  Filed Vitals:   10/13/12 1545  BP: 140/75  Pulse: 71  Temp:   Resp: 21    Post vital signs: Reviewed and stable  Level of consciousness: sedated  Complications: No apparent anesthesia complications

## 2012-10-13 NOTE — Anesthesia Procedure Notes (Addendum)
Procedure Name: Intubation Date/Time: 10/13/2012 11:04 AM Performed by: Margaree Mackintosh Pre-anesthesia Checklist: Patient identified, Timeout performed, Emergency Drugs available, Suction available and Patient being monitored Patient Re-evaluated:Patient Re-evaluated prior to inductionOxygen Delivery Method: Circle system utilized Preoxygenation: Pre-oxygenation with 100% oxygen Intubation Type: IV induction Ventilation: Mask ventilation without difficulty and Oral airway inserted - appropriate to patient size Laryngoscope Size: Mac and 3 Grade View: Grade I Tube type: Oral Tube size: 7.5 mm Number of attempts: 1 Airway Equipment and Method: Stylet and LTA kit utilized Placement Confirmation: ETT inserted through vocal cords under direct vision,  positive ETCO2 and breath sounds checked- equal and bilateral Secured at: 21 cm Tube secured with: Tape Dental Injury: Teeth and Oropharynx as per pre-operative assessment     Anesthesia Regional Block:   Narrative:    Anesthesia Regional Block:  Femoral nerve block  Pre-Anesthetic Checklist: ,, timeout performed, Correct Patient, Correct Site, Correct Laterality, Correct Procedure, Correct Position, site marked, Risks and benefits discussed,  Surgical consent,  Pre-op evaluation,  At surgeon's request and post-op pain management  Laterality: Left  Prep: chloraprep       Needles:  Injection technique: Single-shot  Needle Type: Echogenic Stimulator Needle     Needle Length:cm 9 cm Needle Gauge: 22 and 22 G    Additional Needles:  Procedures: ultrasound guided (picture in chart) Femoral nerve block Narrative:  Start time: 10/13/2012 10:25 AM End time: 10/13/2012 10:30 AM Injection made incrementally with aspirations every 5 mL.  Performed by: Personally   Additional Notes: 20 cc 0.5% Marcaine 1:200  Epi injected easily

## 2012-10-13 NOTE — Brief Op Note (Signed)
10/13/2012  2:34 PM  PATIENT:  Priscille Kluver  67 y.o. female  PRE-OPERATIVE DIAGNOSIS:  Left Knee Osteoarthritis  POST-OPERATIVE DIAGNOSIS:  Left Knee Osteoarthritis  PROCEDURE:  Procedure(s): TOTAL KNEE ARTHROPLASTY  SURGEON:  Surgeon(s): Cammy Copa, MD  ASSISTANT: s vernon pa  ANESTHESIA:   general  EBL: 100 ml    Total I/O In: 2000 [I.V.:2000] Out: 350 [Urine:200; Blood:150]  BLOOD ADMINISTERED: none  DRAINS: none   LOCAL MEDICATIONS USED:  none  SPECIMEN:  No Specimen  COUNTS:  YES  TOURNIQUET:   Total Tourniquet Time Documented: Thigh (Left) - 123 minutes Total: Thigh (Left) - 123 minutes   DICTATION: .Other Dictation: Dictation Number [id number]  PLAN OF CARE: Admit to inpatient   PATIENT DISPOSITION:  PACU - hemodynamically stable

## 2012-10-14 ENCOUNTER — Encounter (HOSPITAL_COMMUNITY): Payer: Self-pay | Admitting: Orthopedic Surgery

## 2012-10-14 LAB — BASIC METABOLIC PANEL
CO2: 27 mEq/L (ref 19–32)
Calcium: 8.5 mg/dL (ref 8.4–10.5)
Creatinine, Ser: 1.09 mg/dL (ref 0.50–1.10)
GFR calc Af Amer: 60 mL/min — ABNORMAL LOW (ref >90)
GFR calc non Af Amer: 52 mL/min — ABNORMAL LOW (ref >90)

## 2012-10-14 LAB — CBC
MCH: 25.7 pg — ABNORMAL LOW (ref 26.0–34.0)
MCHC: 32 g/dL (ref 30.0–36.0)
MCV: 80.2 fL (ref 78.0–100.0)
Platelets: 196 10*3/uL (ref 150–400)
RDW: 14.3 % (ref 11.5–15.5)

## 2012-10-14 LAB — PROTIME-INR: Prothrombin Time: 14.5 seconds (ref 11.6–15.2)

## 2012-10-14 LAB — URINE CULTURE

## 2012-10-14 MED ORDER — WARFARIN SODIUM 7.5 MG PO TABS
7.5000 mg | ORAL_TABLET | Freq: Once | ORAL | Status: AC
  Administered 2012-10-14: 7.5 mg via ORAL
  Filled 2012-10-14: qty 1

## 2012-10-14 NOTE — Evaluation (Signed)
Occupational Therapy Evaluation Patient Details Name: [name] MRN: [id number] DOB: 1945/08/07 Today's Date: 10/14/2012 Time: 1478-2956 OT Time Calculation (min): 13 min  OT Assessment / Plan / Recommendation History of present illness  Pt s/p Left TKA.  Recent Right TKA earlier this year (07/07/12)   Clinical Impression   Pt s/p L TKA.  Pt somewhat limited by fatigue this session due to having just returned to bed (reports she had been up in chair much of the day and has been ambulating to bathroom with nursing).  Anticipate that pt will progress well for d/c home.  Will continue to follow acutely in order to address below problem list.  No f/u OT needed for d/c planning.    OT Assessment       Follow Up Recommendations  No OT follow up;Supervision/Assistance - 24 hour    Barriers to Discharge      Equipment Recommendations  None recommended by OT    Recommendations for Other Services    Frequency       Precautions / Restrictions Precautions Precautions: Knee Precaution Comments: Knee immobilizer until d/c; educated pt on use of KI Required Braces or Orthoses: Knee Immobilizer - Left Knee Immobilizer - Left: On except when in CPM Restrictions Weight Bearing Restrictions: Yes LLE Weight Bearing: Weight bearing as tolerated   Pertinent Vitals/Pain See vitals    ADL  Eating/Feeding: Performed;Independent Where Assessed - Eating/Feeding: Edge of bed Lower Body Bathing: Simulated;Moderate assistance (without AE) Where Assessed - Lower Body Bathing: Unsupported sitting Lower Body Dressing: Simulated;Moderate assistance (without AE) Where Assessed - Lower Body Dressing: Unsupported sitting Equipment Used: Knee Immobilizer Transfers/Ambulation Related to ADLs: Pt declining OOB transfer at this time due to having just returned to bed. ADL Comments: Pt states she purchased AE kit with R TKA earlier this year.  States she used reacher frequently to assist in donning  pants/undergarment and long handled sponge but does not have a need for sock aid (does not wear socks during warm months).      OT Diagnosis:    OT Problem List:   OT Treatment Interventions:     OT Goals(Current goals can be found in the care plan section) Acute Rehab OT Goals Patient Stated Goal: do as good as she did with right tka; no falls OT Goal Formulation: With patient Time For Goal Achievement: 10/21/12 Potential to Achieve Goals: Good  Visit Information  Last OT Received On: 10/14/12 Assistance Needed: +1       Prior Functioning     Home Living Family/patient expects to be discharged to:: Private residence Living Arrangements: Spouse/significant other Available Help at Discharge: Family;Available 24 hours/day Type of Home: House Home Access: Stairs to enter [employer] of Steps: 2 Entrance Stairs-Rails: None Home Layout: One level Home Equipment: Cane - single point;Walker - 2 wheels;Bedside commode (hip kit) Additional Comments: had RIGHT knee replaced in Apr 2014, has all equipment from that previous surgery Prior Function Level of Independence: Independent Communication Communication: No difficulties Dominant Hand: Right         Vision/Perception Vision - History Baseline Vision: Wears glasses all the time   Cognition  Cognition Arousal/Alertness: Awake/alert Behavior During Therapy: WFL for tasks assessed/performed Overall Cognitive Status: Within Functional Limits for tasks assessed    Extremity/Trunk Assessment Upper Extremity Assessment Upper Extremity Assessment: Overall WFL for tasks assessed     Mobility Bed Mobility Bed Mobility: Supine to Sit;Sitting - Scoot to Edge of Bed;Sit to Supine Supine to Sit: 5: Supervision;HOB  elevated Sitting - Scoot to Edge of Bed: 5: Supervision Sit to Supine: 4: Min assist;HOB elevated Transfers Transfers: Not assessed     Exercise     Balance     End of Session OT - End of  Session Equipment Utilized During Treatment: Left knee immobilizer Activity Tolerance: Patient limited by fatigue Patient left: in bed;with call bell/phone within reach Nurse Communication: Mobility status CPM Left Knee CPM Left Knee: Off  GO   10/14/2012 Cipriano Mile OTR/L Pager [phone] Office [phone]   Cipriano Mile 10/14/2012, 2:57 PM

## 2012-10-14 NOTE — Progress Notes (Signed)
ANTICOAGULATION CONSULT NOTE - Follow Up Consult  Pharmacy Consult for coumadin Indication: VTE prophylaxis  No Known Allergies  Patient Measurements: Height: 5\' 3"  (160 cm) Weight: 297 lb (134.718 kg) IBW/kg (Calculated) : 52.4 Heparin Dosing Weight:   Vital Signs: Temp: 97.6 F (36.4 C) (07/23 1310) Temp src: Oral (07/23 1310) BP: 99/35 mmHg (07/23 1310) Pulse Rate: 80 (07/23 1310)  Labs:  Recent Labs  10/13/12 1651 10/14/12 0450  HGB  --  10.8*  HCT  --  33.7*  PLT  --  196  LABPROT 13.8 14.5  INR 1.08 1.15  CREATININE  --  1.09    Estimated Creatinine Clearance: 68.4 ml/min (by C-G formula based on Cr of 1.09).   Medications:  Scheduled:  . calcium-vitamin D  1 tablet Oral Daily  . docusate sodium  100 mg Oral BID  . gabapentin  100 mg Oral TID  . hydrochlorothiazide  25 mg Oral Daily  . ramipril  5 mg Oral Daily  . warfarin   Does not apply Once  . Warfarin - Pharmacist Dosing Inpatient   Does not apply q1800   Infusions:    Assessment: 67 yo female s/p ortho surgery is currently on subtherapeutic coumadin.  INR up to 1.15 from 1.08. Goal of Therapy:  INR 2-3 Monitor platelets by anticoagulation protocol: Yes   Plan:  1) Repeat 7.5 mg Coumadin tonight.  2) F/u AM INR.    Rasha Ibe, Tsz-Yin 10/14/2012,2:07 PM

## 2012-10-14 NOTE — Evaluation (Signed)
Physical Therapy Evaluation Patient Details Name: [name] MRN: [id number] DOB: 05-22-1945 Today's Date: 10/14/2012 Time: 4098-1191 PT Time Calculation (min): 32 min  PT Assessment / Plan / Recommendation History of Present Illness   67 yo s/p LEFT TKA, WBAT, KI at all times  Clinical Impression  Pt s/p recent RIGHT TKA with excellent outcome, is knowledgeable regarding rehabilitation, has all DME and expects to d/c home. Limited this morning by nausea and vomiting x1 during gait training, however expect pt will do very well with therapy and anticipate no barriers to home d/c.  See notes below for details.    PT Assessment  Patient needs continued PT services    Follow Up Recommendations  Home health PT;Supervision - Intermittent    Does the patient have the potential to tolerate intense rehabilitation      Barriers to Discharge        Equipment Recommendations  None recommended by PT    Recommendations for Other Services     Frequency 7X/week    Precautions / Restrictions Precautions Precautions: Knee Precaution Comments: Knee immobilizer until d/c; educated pt on use of KI Required Braces or Orthoses: Knee Immobilizer - Left Knee Immobilizer - Left: On except when in CPM Restrictions Weight Bearing Restrictions: Yes LLE Weight Bearing: Weight bearing as tolerated   Pertinent Vitals/Pain 4/10 pain in left knee       Mobility  Bed Mobility Bed Mobility: Supine to Sit Supine to Sit: 5: Supervision;HOB elevated Details for Bed Mobility Assistance: HOB at 45 degrees or greater, but pt moves to EOB unassisted with KI in place Transfers Transfers: Sit to Stand;Stand to Sit Sit to Stand: 4: Min assist;With upper extremity assist;From bed Stand to Sit: 5: Supervision;With upper extremity assist;With armrests;To chair/3-in-1 Details for Transfer Assistance: instructional cues for hand placement for optimal safety during transition to/from RW, for positioning of  operated leg into sitting Ambulation/Gait Ambulation/Gait Assistance: 4: Min guard Ambulation Distance (Feet): 15 Feet Assistive device: Rolling walker Ambulation/Gait Assistance Details: instructional cues for sequencing with RW; encouragement during short walk; terminated early due to nausea -- vomited clear brown fluid reported to nurse Gait Pattern: Step-to pattern;Decreased stance time - left    Exercises     PT Diagnosis: Difficulty walking;Acute pain  PT Problem List: Pain;Obesity;Decreased knowledge of use of DME;Decreased mobility;Decreased range of motion;Decreased strength PT Treatment Interventions: Modalities;Patient/family education;Therapeutic exercise;Therapeutic activities;Functional mobility training;Stair training;Gait training;DME instruction     PT Goals(Current goals can be found in the care plan section) Acute Rehab PT Goals Patient Stated Goal: do as good as she did with right tka; no falls PT Goal Formulation: With patient Time For Goal Achievement: 10/21/12 Potential to Achieve Goals: Good  Visit Information  Last PT Received On: 10/14/12 Assistance Needed: +1       Prior Functioning  Home Living Family/patient expects to be discharged to:: Private residence Living Arrangements: Spouse/significant other Available Help at Discharge: Family;Available 24 hours/day Type of Home: House Home Access: Stairs to enter [employer] of Steps: 2 Entrance Stairs-Rails: None Home Layout: One level Home Equipment: Cane - single point;Walker - 2 wheels;Bedside commode Additional Comments: had RIGHT knee replaced in Apr 2014, has all equipment from that previous surgery Prior Function Level of Independence: Independent Communication Communication: No difficulties Dominant Hand: Right    Cognition  Cognition Arousal/Alertness: Lethargic;Awake/alert;Suspect due to medications (drowsy between conversations) Behavior During Therapy: [facility] for tasks  assessed/performed Overall Cognitive Status: Within Functional Limits for tasks assessed    Extremity/Trunk  Assessment Upper Extremity Assessment Upper Extremity Assessment: Overall WFL for tasks assessed Lower Extremity Assessment Lower Extremity Assessment: LLE deficits/detail LLE Deficits / Details: s/p TKA with leg length bandage observed under KI LLE: Unable to fully assess due to immobilization   Balance    End of Session PT - End of Session Equipment Utilized During Treatment: Left knee immobilizer Activity Tolerance: Patient tolerated treatment well (limited by nausea/vomiting this session) Patient left: in chair;with call bell/phone within reach;with family/visitor present Nurse Communication: Mobility status  GP     Dennis Bast 10/14/2012, 9:51 AM

## 2012-10-14 NOTE — Progress Notes (Signed)
UR COMPLETED  

## 2012-10-14 NOTE — Progress Notes (Signed)
Subjective: Pt stable - pain controlled - can do SLR   Objective: Vital signs in last 24 hours: Temp:  [97.1 F (36.2 C)-98.7 F (37.1 C)] 97.6 F (36.4 C) (07/23 0621) Pulse Rate:  [64-83] 83 (07/23 0621) Resp:  [12-21] 13 (07/23 0621) BP: (86-156)/(46-84) 86/53 mmHg (07/23 0621) SpO2:  [93 %-100 %] 97 % (07/23 0621) Weight:  [134.718 kg (297 lb)] 134.718 kg (297 lb) (07/22 0931)  Intake/Output from previous day: 07/22 0701 - 07/23 0700 In: 4500 [P.O.:1000; I.V.:3500] Out: 1000 [Urine:850; Blood:150] Intake/Output this shift:    Exam:  Neurovascular intact Sensation intact distally Intact pulses distally  Labs:  Recent Labs  10/14/12 0450  HGB 10.8*    Recent Labs  10/14/12 0450  WBC 8.7  RBC 4.20  HCT 33.7*  PLT 196    Recent Labs  10/14/12 0450  NA 135  K 3.9  CL 100  CO2 27  BUN 25*  CREATININE 1.09  GLUCOSE 142*  CALCIUM 8.5    Recent Labs  10/13/12 1651  INR 1.08    Assessment/Plan: DC PCA - continue CPM - hl iv - PT today   DEAN,GREGORY SCOTT 10/14/2012, 7:03 AM

## 2012-10-15 LAB — PROTIME-INR: Prothrombin Time: 16.4 seconds — ABNORMAL HIGH (ref 11.6–15.2)

## 2012-10-15 LAB — CBC
HCT: 29 % — ABNORMAL LOW (ref 36.0–46.0)
Hemoglobin: 9.6 g/dL — ABNORMAL LOW (ref 12.0–15.0)
MCH: 26.2 pg (ref 26.0–34.0)
MCV: 79 fL (ref 78.0–100.0)
RBC: 3.67 MIL/uL — ABNORMAL LOW (ref 3.87–5.11)

## 2012-10-15 MED ORDER — WARFARIN SODIUM 7.5 MG PO TABS
7.5000 mg | ORAL_TABLET | Freq: Once | ORAL | Status: AC
  Administered 2012-10-15: 7.5 mg via ORAL
  Filled 2012-10-15: qty 1

## 2012-10-15 MED ORDER — METHOCARBAMOL 500 MG PO TABS: 500.0000 mg | ORAL_TABLET | Freq: Four times a day (QID) | ORAL | Status: DC | PRN

## 2012-10-15 MED ORDER — WARFARIN SODIUM 5 MG PO TABS: 5.0000 mg | ORAL_TABLET | Freq: Every day | ORAL | Status: DC

## 2012-10-15 MED ORDER — OXYCODONE HCL 5 MG PO TABS: 5.0000 mg | ORAL_TABLET | ORAL | Status: DC | PRN

## 2012-10-15 NOTE — Progress Notes (Signed)
ANTICOAGULATION CONSULT NOTE - Follow Up Consult  Pharmacy Consult for coumadin Indication: VTE prophylaxis  No Known Allergies  Patient Measurements: Height: 5\' 3"  (160 cm) Weight: 297 lb (134.718 kg) IBW/kg (Calculated) : 52.4 Heparin Dosing Weight:   Vital Signs: Temp: 98 F (36.7 C) (07/24 0521) BP: 143/59 mmHg (07/24 0521) Pulse Rate: 109 (07/24 0521)  Labs:  Recent Labs  10/13/12 1651 10/14/12 0450 10/15/12 0505  HGB  --  10.8* 9.6*  HCT  --  33.7* 29.0*  PLT  --  196 127*  LABPROT 13.8 14.5 16.4*  INR 1.08 1.15 1.36  CREATININE  --  1.09  --     Estimated Creatinine Clearance: 68.4 ml/min (by C-G formula based on Cr of 1.09).   Medications:  Scheduled:  . calcium-vitamin D  1 tablet Oral Daily  . docusate sodium  100 mg Oral BID  . gabapentin  100 mg Oral TID  . hydrochlorothiazide  25 mg Oral Daily  . ramipril  5 mg Oral Daily  . warfarin   Does not apply Once  . Warfarin - Pharmacist Dosing Inpatient   Does not apply q1800   Infusions:    Assessment: 67 year old female s/p TKA receiving Coumadin for VTE prophylaxis.  Her INR is trending up appropriately after 2 doses of Coumadin.  Goal of Therapy:  INR 2-3 Monitor platelets by anticoagulation protocol: Yes   Plan:  1) Repeat 7.5 mg Coumadin tonight.  2) F/u AM INR.   [name], Pharm.D., BCPS, AAHIVP Clinical Pharmacist Phone: [phone] Pager: [phone] 10/15/2012, 2:25 PM

## 2012-10-15 NOTE — Progress Notes (Signed)
Physical Therapy Treatment Patient Details Name: [name] MRN: [id number] DOB: 22-Sep-1945 Today's Date: 10/15/2012 Time: 8657-8469 PT Time Calculation (min): 26 min  PT Assessment / Plan / Recommendation  History of Present Illness     Clinical Impression    PT Comments   Patient is making good progress with PT.  From a mobility standpoint anticipate patient will be ready for DC home tomorrow after stair training .     Follow Up Recommendations  Home health PT;Supervision - Intermittent     Does the patient have the potential to tolerate intense rehabilitation     Barriers to Discharge        Equipment Recommendations  None recommended by PT    Recommendations for Other Services    Frequency 7X/week   Progress towards PT Goals Progress towards PT goals: Progressing toward goals  Plan Current plan remains appropriate    Precautions / Restrictions Precautions Precautions: Knee Required Braces or Orthoses: Knee Immobilizer - Left Knee Immobilizer - Left: On except when in CPM Restrictions LLE Weight Bearing: Weight bearing as tolerated   Pertinent Vitals/Pain no apparent distress     Mobility  Bed Mobility Supine to Sit: 5: Supervision;HOB elevated Transfers Sit to Stand: 5: Supervision;From bed;From chair/3-in-1 Stand to Sit: 5: Supervision;To chair/3-in-1 Details for Transfer Assistance: Supervision for safety.  Ambulation/Gait Ambulation/Gait Assistance: 5: Supervision Ambulation Distance (Feet): 100 Feet Assistive device: Rolling walker Ambulation/Gait Assistance Details: Supervision for safety. No cues needed Gait Pattern: Step-to pattern;Decreased stance time - left    Exercises Total Joint Exercises Quad Sets: AROM;Left;10 reps Heel Slides: AAROM;Left;10 reps Hip ABduction/ADduction: AAROM;Left;10 reps Straight Leg Raises: AAROM;Left;10 reps   PT Diagnosis:    PT Problem List:   PT Treatment Interventions:     PT Goals (current goals can now  be found in the care plan section)    Visit Information  Last PT Received On: 10/15/12 Assistance Needed: +1    Subjective Data      Cognition  Cognition Arousal/Alertness: Awake/alert Behavior During Therapy: WFL for tasks assessed/performed Overall Cognitive Status: Within Functional Limits for tasks assessed    Balance     End of Session PT - End of Session Equipment Utilized During Treatment: Left knee immobilizer Activity Tolerance: Patient tolerated treatment well Patient left: in chair;with call bell/phone within reach;with family/visitor present Nurse Communication: Mobility status   GP     Fredrich Birks 10/15/2012, 2:26 PM 10/15/2012 Fredrich Birks PTA [phone] pager [phone] office

## 2012-10-15 NOTE — Progress Notes (Signed)
Subjective: Pt stable - amb in hall   Objective: Vital signs in last 24 hours: Temp:  [97.6 F (36.4 C)-98.9 F (37.2 C)] 98 F (36.7 C) (07/24 0521) Pulse Rate:  [74-109] 109 (07/24 0521) Resp:  [16-18] 18 (07/24 0521) BP: (85-143)/(35-59) 143/59 mmHg (07/24 0521) SpO2:  [92 %-100 %] 95 % (07/24 0521)  Intake/Output from previous day: 07/23 0701 - 07/24 0700 In: 240 [P.O.:240] Out: 400 [Urine:400] Intake/Output this shift:    Exam:  Neurovascular intact Sensation intact distally Intact pulses distally  Labs:  Recent Labs  10/14/12 0450 10/15/12 0505  HGB 10.8* 9.6*    Recent Labs  10/14/12 0450 10/15/12 0505  WBC 8.7 7.4  RBC 4.20 3.67*  HCT 33.7* 29.0*  PLT 196 127*    Recent Labs  10/14/12 0450  NA 135  K 3.9  CL 100  CO2 27  BUN 25*  CREATININE 1.09  GLUCOSE 142*  CALCIUM 8.5    Recent Labs  10/14/12 0450 10/15/12 0505  INR 1.15 1.36    Assessment/Plan: Plan dc to home am - doing well   DEAN,GREGORY SCOTT 10/15/2012, 8:24 AM

## 2012-10-15 NOTE — Progress Notes (Signed)
Occupational Therapy Treatment Patient Details Name: [name] MRN: [id number] DOB: 26-Jan-1946 Today's Date: 10/15/2012 Time: 4098-1191 OT Time Calculation (min): 19 min  OT Assessment / Plan / Recommendation  History of present illness  pt with L TKA   Clinical Impression Pt progressing well and should continue with acute OT services to maximize level of function and safety to return home   OT comments  Pt pleasant and cooperative  Follow Up Recommendations  No OT follow up;Supervision/Assistance - 24 hour    Barriers to Discharge       Equipment Recommendations  None recommended by OT    Recommendations for Other Services    Frequency Min 2X/week   Progress towards OT Goals Progress towards OT goals: Progressing toward goals  Plan Discharge plan remains appropriate    Precautions / Restrictions Precautions Precautions: Knee Required Braces or Orthoses: Knee Immobilizer - Left Knee Immobilizer - Left: On except when in CPM Restrictions Weight Bearing Restrictions: Yes LLE Weight Bearing: Weight bearing as tolerated   Pertinent Vitals/Pain 2/10 L knee    ADL  Grooming: Performed;Wash/dry hands;Wash/dry face;Min guard Where Assessed - Grooming: Supported standing Toilet Transfer: Performed;Min Pension scheme manager Method: Sit to Barista: Raised toilet seat with arms (or 3-in-1 over toilet);Grab bars Toileting - Clothing Manipulation and Hygiene: Performed;Min guard Where Assessed - Engineer, mining and Hygiene: Standing Tub/Shower Transfer: Performed;Min guard Web designer Method: Science writer: Grab bars;Other (comment) (3 in 1) Equipment Used: Rolling walker;Gait belt;Knee Immobilizer;Other (comment) (3 in 1) ADL Comments: Pt states she purchased AE kit with R TKA earlier this year.  States she used reacher frequently to assist in donning pants/undergarment and long handled sponge but does  not have a need for sock aid (does not wear socks during warm months).      OT Diagnosis:    OT Problem List:   OT Treatment Interventions:     OT Goals(current goals can now be found in the care plan section) Acute Rehab OT Goals Patient Stated Goal: To return home  Visit Information  Last OT Received On: 10/15/12 Assistance Needed: +1    Subjective Data      Prior Functioning       Cognition  Cognition Arousal/Alertness: Awake/alert Behavior During Therapy: WFL for tasks assessed/performed Overall Cognitive Status: Within Functional Limits for tasks assessed    Mobility  Bed Mobility Bed Mobility: Not assessed Supine to Sit: 5: Supervision;HOB elevated Sitting - Scoot to Edge of Bed: 5: Supervision Details for Bed Mobility Assistance:  pt moves to EOB unassisted with KI in place Transfers Transfers: Sit to Stand;Stand to Sit Sit to Stand: 4: Min guard;With upper extremity assist;From bed;From chair/3-in-1;With armrests Stand to Sit: To chair/3-in-1;With armrests;With upper extremity assist;4: Min guard Details for Transfer Assistance: MinGuard for safety. No cues needed    Exercises  Total Joint Exercises Quad Sets: AROM;Left;10 reps Heel Slides: AAROM;Left;10 reps Hip ABduction/ADduction: AAROM;Left;10 reps Straight Leg Raises: AAROM;Left;10 reps   Balance Balance Balance Assessed: No   End of Session OT - End of Session Equipment Utilized During Treatment: Left knee immobilizer;Rolling walker;Gait belt;Other (comment) (3 in 1) Activity Tolerance: Patient tolerated treatment well Patient left: in chair;with call bell/phone within reach  GO     Galen Manila 10/15/2012, 10:27 AM

## 2012-10-15 NOTE — Progress Notes (Signed)
Physical Therapy Treatment Patient Details Name: [name] MRN: [id number] DOB: 26-Feb-1946 Today's Date: 10/15/2012 Time:  -     PT Assessment / Plan / Recommendation  History of Present Illness         PT Comments   Patient progressing well with ambulation. Very motivated. Anticipate patient will DC tomorrow as that is what she did with the right knee back in April. Continue to work on safe ambulation next session  Follow Up Recommendations  Home health PT;Supervision - Intermittent     Does the patient have the potential to tolerate intense rehabilitation     Barriers to Discharge        Equipment Recommendations  None recommended by PT    Recommendations for Other Services    Frequency 7X/week   Progress towards PT Goals Progress towards PT goals: Progressing toward goals  Plan Current plan remains appropriate    Precautions / Restrictions Precautions Precautions: Knee Required Braces or Orthoses: Knee Immobilizer - Left Knee Immobilizer - Left: On except when in CPM Restrictions LLE Weight Bearing: Weight bearing as tolerated   Pertinent Vitals/Pain no apparent distress     Mobility  Bed Mobility Supine to Sit: 5: Supervision;HOB elevated Sitting - Scoot to Edge of Bed: 5: Supervision Details for Bed Mobility Assistance:  pt moves to EOB unassisted with KI in place Transfers Sit to Stand: 4: Min guard;With upper extremity assist;From bed;From chair/3-in-1;With armrests Stand to Sit: To chair/3-in-1;With armrests;With upper extremity assist;4: Min guard Details for Transfer Assistance: MinGuard for safety. No cues needed Ambulation/Gait Ambulation/Gait Assistance: 4: Min guard Ambulation Distance (Feet): 90 Feet Ambulation/Gait Assistance Details: Cues for posture and to stay closer into RW Gait Pattern: Step-to pattern;Decreased stance time - left    Exercises Total Joint Exercises Quad Sets: AROM;Left;10 reps Heel Slides: AAROM;Left;10 reps Hip  ABduction/ADduction: AAROM;Left;10 reps Straight Leg Raises: AAROM;Left;10 reps   PT Diagnosis:    PT Problem List:   PT Treatment Interventions:     PT Goals (current goals can now be found in the care plan section)    Visit Information  Last PT Received On: 10/15/12 Assistance Needed: +1    Subjective Data      Cognition  Cognition Arousal/Alertness: Awake/alert Behavior During Therapy: WFL for tasks assessed/performed Overall Cognitive Status: Within Functional Limits for tasks assessed    Balance     End of Session PT - End of Session Equipment Utilized During Treatment: Left knee immobilizer Activity Tolerance: Patient tolerated treatment well Patient left: in chair;with call bell/phone within reach;with family/visitor present   GP     Fredrich Birks 10/15/2012, 8:17 AM 10/15/2012 Fredrich Birks PTA [phone] pager [phone] office

## 2012-10-16 LAB — CBC
Hemoglobin: 9.1 g/dL — ABNORMAL LOW (ref 12.0–15.0)
MCH: 26.2 pg (ref 26.0–34.0)
RBC: 3.47 MIL/uL — ABNORMAL LOW (ref 3.87–5.11)

## 2012-10-16 LAB — PROTIME-INR: Prothrombin Time: 17.3 seconds — ABNORMAL HIGH (ref 11.6–15.2)

## 2012-10-16 NOTE — Discharge Summary (Signed)
Physician Discharge Summary  Patient ID: [name] MRN: [id number] DOB/AGE: [birth year] 66 y.o.  Admit date: 10/13/2012 Discharge date: 10/16/2012  Admission Diagnoses:  Left knee arthritis  Discharge Diagnoses:  Same  Surgeries: Procedure(s): TOTAL KNEE ARTHROPLASTY on 10/13/2012   Consultants:    Discharged Condition: Stable  Hospital Course: [name] is an 67 y.o. female who was admitted 10/13/2012 with a chief complaint of left knee pain, and found to have a diagnosis of left knee arthritis.  They were brought to the operating room on 10/13/2012 and underwent the above named procedures. She mobilized with PT and used CPM before dc. Incision and walking both good by dc.   Antibiotics given:  Anti-infectives   Start     Dose/Rate Route Frequency Ordered Stop   10/13/12 1645  ceFAZolin (ANCEF) IVPB 2 g/50 mL premix     2 g 100 mL/hr over 30 Minutes Intravenous 3 times per day 10/13/12 1638 10/13/12 2150   10/13/12 0600  ceFAZolin (ANCEF) 3 g in dextrose 5 % 50 mL IVPB     3 g 160 mL/hr over 30 Minutes Intravenous On call to O.R. 10/12/12 1411 10/13/12 1125    .  Recent vital signs:  Filed Vitals:   10/16/12 0615  BP: 115/66  Pulse: 78  Temp: 98.8 F (37.1 C)  Resp: 16    Recent laboratory studies:  Results for orders placed during the hospital encounter of 10/13/12  URINE CULTURE      Result Value Range   Specimen Description URINE, CLEAN CATCH     Special Requests NONE     Culture  Setup Time 10/13/2012 10:42     Colony Count >=100,000 COLONIES/ML     Culture       Value: Multiple bacterial morphotypes present, none predominant. Suggest appropriate recollection if clinically indicated.   Report Status 10/14/2012 FINAL    URINALYSIS, ROUTINE W REFLEX MICROSCOPIC      Result Value Range   Color, Urine YELLOW  YELLOW   APPearance CLEAR  CLEAR   Specific Gravity, Urine 1.024  1.005 - 1.030   pH 6.5  5.0 - 8.0   Glucose, UA NEGATIVE  NEGATIVE mg/dL    Hgb urine dipstick NEGATIVE  NEGATIVE   Bilirubin Urine NEGATIVE  NEGATIVE   Ketones, ur NEGATIVE  NEGATIVE mg/dL   Protein, ur NEGATIVE  NEGATIVE mg/dL   Urobilinogen, UA 1.0  0.0 - 1.0 mg/dL   Nitrite NEGATIVE  NEGATIVE   Leukocytes, UA NEGATIVE  NEGATIVE  PROTIME-INR      Result Value Range   Prothrombin Time 14.5  11.6 - 15.2 seconds   INR 1.15  0.00 - 1.49  CBC      Result Value Range   WBC 8.7  4.0 - 10.5 K/uL   RBC 4.20  3.87 - 5.11 MIL/uL   Hemoglobin 10.8 (*) 12.0 - 15.0 g/dL   HCT 40.9 (*) 81.1 - 91.4 %   MCV 80.2  78.0 - 100.0 fL   MCH 25.7 (*) 26.0 - 34.0 pg   MCHC 32.0  30.0 - 36.0 g/dL   RDW 78.2  95.6 - 21.3 %   Platelets 196  150 - 400 K/uL  BASIC METABOLIC PANEL      Result Value Range   Sodium 135  135 - 145 mEq/L   Potassium 3.9  3.5 - 5.1 mEq/L   Chloride 100  96 - 112 mEq/L   CO2 27  19 - 32 mEq/L   Glucose,  Bld 142 (*) 70 - 99 mg/dL   BUN 25 (*) 6 - 23 mg/dL   Creatinine, Ser 4.09  0.50 - 1.10 mg/dL   Calcium 8.5  8.4 - 81.1 mg/dL   GFR calc non Af Amer 52 (*) >90 mL/min   GFR calc Af Amer 60 (*) >90 mL/min  PROTIME-INR      Result Value Range   Prothrombin Time 13.8  11.6 - 15.2 seconds   INR 1.08  0.00 - 1.49  PROTIME-INR      Result Value Range   Prothrombin Time 16.4 (*) 11.6 - 15.2 seconds   INR 1.36  0.00 - 1.49  CBC      Result Value Range   WBC 7.4  4.0 - 10.5 K/uL   RBC 3.67 (*) 3.87 - 5.11 MIL/uL   Hemoglobin 9.6 (*) 12.0 - 15.0 g/dL   HCT 91.4 (*) 78.2 - 95.6 %   MCV 79.0  78.0 - 100.0 fL   MCH 26.2  26.0 - 34.0 pg   MCHC 33.1  30.0 - 36.0 g/dL   RDW 21.3  08.6 - 57.8 %   Platelets 127 (*) 150 - 400 K/uL  PROTIME-INR      Result Value Range   Prothrombin Time 17.3 (*) 11.6 - 15.2 seconds   INR 1.45  0.00 - 1.49  CBC      Result Value Range   WBC 6.5  4.0 - 10.5 K/uL   RBC 3.47 (*) 3.87 - 5.11 MIL/uL   Hemoglobin 9.1 (*) 12.0 - 15.0 g/dL   HCT 46.9 (*) 62.9 - 52.8 %   MCV 78.7  78.0 - 100.0 fL   MCH 26.2  26.0 - 34.0 pg    MCHC 33.3  30.0 - 36.0 g/dL   RDW 41.3  24.4 - 01.0 %   Platelets 120 (*) 150 - 400 K/uL    Discharge Medications:     Medication List    STOP taking these medications       fish oil-omega-3 fatty acids 1000 MG capsule      TAKE these medications       CALCIUM 600+D 600-400 MG-UNIT per tablet  Generic drug:  Calcium Carbonate-Vitamin D  Take 1 tablet by mouth daily.     gabapentin 100 MG capsule  Commonly known as:  NEURONTIN  Take 1 capsule (100 mg total) by mouth 3 (three) times daily.     hydrochlorothiazide 25 MG tablet  Commonly known as:  HYDRODIURIL  Take 1 tablet (25 mg total) by mouth daily.     methocarbamol 500 MG tablet  Commonly known as:  ROBAXIN  Take 1 tablet (500 mg total) by mouth every 6 (six) hours as needed.     oxyCODONE 5 MG immediate release tablet  Commonly known as:  Oxy IR/ROXICODONE  Take 1-2 tablets (5-10 mg total) by mouth every 3 (three) hours as needed.     ramipril 5 MG capsule  Commonly known as:  ALTACE  Take 5 mg by mouth daily.     warfarin 5 MG tablet  Commonly known as:  COUMADIN  Take 1 tablet (5 mg total) by mouth daily.        Diagnostic Studies: No results found.  Disposition: 01-Home or Self Care      Discharge Orders   Future Appointments Provider Department Dept Phone   03/15/2013 8:45 AM [facility] [place] [phone]   Future Orders Complete By Expires  Call MD / Call 911  As directed     Comments:      If you experience chest pain or shortness of breath, CALL 911 and be transported to the hospital emergency room.  If you develope a fever above 101 F, pus (white drainage) or increased drainage or redness at the wound, or calf pain, call your surgeon's office.    Constipation Prevention  As directed     Comments:      Drink plenty of fluids.  Prune juice may be helpful.  You may use a stool softener, such as Colace (over the counter) 100 mg twice a day.  Use MiraLax (over  the counter) for constipation as needed.    Diet - low sodium heart healthy  As directed     Discharge instructions  As directed     Comments:      1. Weight bearing as tolerated with crutches or walker 2. CPM 6 hours per day along with home health PT 3. Keep incision dry    Increase activity slowly as tolerated  As directed           Signed: DEAN,GREGORY SCOTT 10/16/2012, 3:50 PM

## 2012-10-16 NOTE — Progress Notes (Signed)
Physical Therapy Treatment Patient Details Name: [name] MRN: [id number] DOB: 06/16/1945 Today's Date: 10/16/2012 Time: 0727-0750 PT Time Calculation (min): 23 min  PT Assessment / Plan / Recommendation     Clinical Impression Pt cont's to make steady progress with mobility at this date.  Increased ambulation distance & negotiated 3 steps.        Follow Up Recommendations  Home health PT;Supervision - Intermittent     Does the patient have the potential to tolerate intense rehabilitation     Barriers to Discharge        Equipment Recommendations  None recommended by PT    Recommendations for Other Services    Frequency 7X/week   Progress towards PT Goals Progress towards PT goals: Progressing toward goals  Plan Current plan remains appropriate    Precautions / Restrictions Precautions Precautions: Knee Required Braces or Orthoses: Knee Immobilizer - Left Knee Immobilizer - Left: On except when in CPM Restrictions LLE Weight Bearing: Weight bearing as tolerated   Pertinent Vitals/Pain 3/10 Lt knee at rest & with activity.     Mobility  Bed Mobility Bed Mobility: Not assessed Transfers Transfers: Sit to Stand;Stand to Sit Sit to Stand: With upper extremity assist;With armrests;From chair/3-in-1;6: Modified independent (Device/Increase time) Stand to Sit: 6: Modified independent (Device/Increase time);With upper extremity assist;With armrests;To chair/3-in-1 Details for Transfer Assistance: cues to use UE's to control descent Ambulation/Gait Ambulation/Gait Assistance: 5: Supervision Ambulation Distance (Feet): 200 Feet (100' x 2) Assistive device: Rolling walker Ambulation/Gait Assistance Details: cues for increased hip/knee flexion during swing phase LLE.   Gait Pattern: Step-to pattern;Decreased hip/knee flexion - left Stairs: Yes Stairs Assistance: 4: Min assist Stairs Assistance Details (indicate cue type and reason): (A) to stabilize RW & cues for  sequencing + technique Stair Management Technique: No rails;Backwards;With walker Number of Stairs: 3 Wheelchair Mobility Wheelchair Mobility: No    Exercises Total Joint Exercises Ankle Circles/Pumps: Both;10 reps Quad Sets: Both;10 reps Hip ABduction/ADduction: Strengthening;Left;10 reps Straight Leg Raises: Strengthening;Left;10 reps Long Arc Quad: Strengthening;Left;10 reps Knee Flexion: Left;10 reps     PT Goals (current goals can now be found in the care plan section) Acute Rehab PT Goals PT Goal Formulation: With patient Time For Goal Achievement: 10/21/12 Potential to Achieve Goals: Good  Visit Information  Last PT Received On: 10/16/12 Assistance Needed: +1    Subjective Data      Cognition  Cognition Arousal/Alertness: Awake/alert Behavior During Therapy: WFL for tasks assessed/performed Overall Cognitive Status: Within Functional Limits for tasks assessed    Balance     End of Session PT - End of Session Equipment Utilized During Treatment: Gait belt Activity Tolerance: Patient tolerated treatment well Patient left: in chair;with call bell/phone within reach Nurse Communication: Mobility status   GP     Lara Mulch 10/16/2012, 7:59 AM   Verdell Face, PTA [phone] 10/16/2012

## 2012-10-16 NOTE — Progress Notes (Signed)
RN reviewed discharge instructions with patient. All questions answered. Prescriptions given. Patient wheeled down with volunteer services to husbands car.

## 2012-10-16 NOTE — Progress Notes (Signed)
Occupational Therapy Treatment Patient Details Name: [name] MRN: [id number] DOB: 1945-07-31 Today's Date: 10/16/2012 Time: 4098-1191 OT Time Calculation (min): 17 min  OT Assessment / Plan / Recommendation     OT comments  Pt. Agreeable to participation in skilled ot prior to pending d/c home today. Recent sx. On right knee 4/14 so pt. Familiar with Nurse, children's. And able to return demonstration of shower transfer.    Follow Up Recommendations  No OT follow up           Equipment Recommendations  None recommended by OT        Frequency Min 2X/week   Progress towards OT Goals Progress towards OT goals: Progressing toward goals  Plan Discharge plan remains appropriate    Precautions / Restrictions Precautions Precautions: Knee Restrictions LLE Weight Bearing: Weight bearing as tolerated   Pertinent Vitals/Pain 3/10 per pt., repositioned and encouraged ambulation    ADL  Lower Body Dressing: Performed;Maximal assistance Where Assessed - Lower Body Dressing: Unsupported sitting Tub/Shower Transfer: Performed;Min guard Tub/Shower Transfer Method: Science writer: Walk in shower Equipment Used: Rolling walker Transfers/Ambulation Related to ADLs: s all aspects of transfers and ambulation ADL Comments: pt. states husband able to assist with lb b/d as needed along with use of a/e.  completed shower transfer min guard with cues for proper transfer tech. ie: stepping in/out of shower with operated leg.           OT Goals(current goals can now be found in the care plan section)    Visit Information  Last OT Received On: 10/16/12                 Cognition  Cognition Arousal/Alertness: Awake/alert Behavior During Therapy: [facility] for tasks assessed/performed Overall Cognitive Status: Within Functional Limits for tasks assessed    Mobility  Transfers Transfers: Sit to Stand;Stand to Sit Sit to Stand: 5: Supervision;From  chair/3-in-1 Stand to Sit: 5: Supervision;To chair/3-in-1               End of Session OT - End of Session Activity Tolerance: Patient tolerated treatment well Patient left: Other (comment) (with pta in therapy gym)       Robet Leu, COTA/L 10/16/2012, 7:47 AM

## 2012-10-16 NOTE — Progress Notes (Signed)
Subjective: Pt stable - moving well   Objective: Vital signs in last 24 hours: Temp:  [98.8 F (37.1 C)-100.3 F (37.9 C)] 98.8 F (37.1 C) (07/25 0615) Pulse Rate:  [78-88] 78 (07/25 0615) Resp:  [16-18] 16 (07/25 0615) BP: (104-120)/(42-66) 115/66 mmHg (07/25 0615) SpO2:  [95 %-96 %] 96 % (07/25 0615)  Intake/Output from previous day: 07/24 0701 - 07/25 0700 In: 720 [P.O.:720] Out: -  Intake/Output this shift:    Exam:  Neurovascular intact Sensation intact distally Intact pulses distally Dorsiflexion/Plantar flexion intact  Labs:  Recent Labs  10/14/12 0450 10/15/12 0505 10/16/12 0455  HGB 10.8* 9.6* 9.1*    Recent Labs  10/15/12 0505 10/16/12 0455  WBC 7.4 6.5  RBC 3.67* 3.47*  HCT 29.0* 27.3*  PLT 127* 120*    Recent Labs  10/14/12 0450  NA 135  K 3.9  CL 100  CO2 27  BUN 25*  CREATININE 1.09  GLUCOSE 142*  CALCIUM 8.5    Recent Labs  10/15/12 0505 10/16/12 0455  INR 1.36 1.45    Assessment/Plan: Pt doing well - plan dc to home today - incision ok   Clista Rainford SCOTT 10/16/2012, 7:17 AM

## 2012-10-17 DIAGNOSIS — Z7901 Long term (current) use of anticoagulants: Secondary | ICD-10-CM | POA: Diagnosis not present

## 2012-10-17 DIAGNOSIS — Z471 Aftercare following joint replacement surgery: Secondary | ICD-10-CM | POA: Diagnosis not present

## 2012-10-17 DIAGNOSIS — Z5181 Encounter for therapeutic drug level monitoring: Secondary | ICD-10-CM | POA: Diagnosis not present

## 2012-10-17 DIAGNOSIS — Z96659 Presence of unspecified artificial knee joint: Secondary | ICD-10-CM | POA: Diagnosis not present

## 2012-10-17 DIAGNOSIS — Z4801 Encounter for change or removal of surgical wound dressing: Secondary | ICD-10-CM | POA: Diagnosis not present

## 2012-10-17 DIAGNOSIS — M1710 Unilateral primary osteoarthritis, unspecified knee: Secondary | ICD-10-CM | POA: Diagnosis not present

## 2012-10-19 ENCOUNTER — Other Ambulatory Visit: Payer: Self-pay | Admitting: Family Medicine

## 2012-10-19 DIAGNOSIS — Z5181 Encounter for therapeutic drug level monitoring: Secondary | ICD-10-CM | POA: Diagnosis not present

## 2012-10-19 DIAGNOSIS — Z96659 Presence of unspecified artificial knee joint: Secondary | ICD-10-CM | POA: Diagnosis not present

## 2012-10-19 DIAGNOSIS — Z4801 Encounter for change or removal of surgical wound dressing: Secondary | ICD-10-CM | POA: Diagnosis not present

## 2012-10-19 DIAGNOSIS — Z7901 Long term (current) use of anticoagulants: Secondary | ICD-10-CM | POA: Diagnosis not present

## 2012-10-19 DIAGNOSIS — Z471 Aftercare following joint replacement surgery: Secondary | ICD-10-CM | POA: Diagnosis not present

## 2012-10-20 DIAGNOSIS — Z5181 Encounter for therapeutic drug level monitoring: Secondary | ICD-10-CM | POA: Diagnosis not present

## 2012-10-20 DIAGNOSIS — Z96659 Presence of unspecified artificial knee joint: Secondary | ICD-10-CM | POA: Diagnosis not present

## 2012-10-20 DIAGNOSIS — Z7901 Long term (current) use of anticoagulants: Secondary | ICD-10-CM | POA: Diagnosis not present

## 2012-10-20 DIAGNOSIS — Z471 Aftercare following joint replacement surgery: Secondary | ICD-10-CM | POA: Diagnosis not present

## 2012-10-20 DIAGNOSIS — Z4801 Encounter for change or removal of surgical wound dressing: Secondary | ICD-10-CM | POA: Diagnosis not present

## 2012-10-21 DIAGNOSIS — Z4801 Encounter for change or removal of surgical wound dressing: Secondary | ICD-10-CM | POA: Diagnosis not present

## 2012-10-21 DIAGNOSIS — Z5181 Encounter for therapeutic drug level monitoring: Secondary | ICD-10-CM | POA: Diagnosis not present

## 2012-10-21 DIAGNOSIS — Z96659 Presence of unspecified artificial knee joint: Secondary | ICD-10-CM | POA: Diagnosis not present

## 2012-10-21 DIAGNOSIS — Z471 Aftercare following joint replacement surgery: Secondary | ICD-10-CM | POA: Diagnosis not present

## 2012-10-21 DIAGNOSIS — Z7901 Long term (current) use of anticoagulants: Secondary | ICD-10-CM | POA: Diagnosis not present

## 2012-10-22 DIAGNOSIS — Z5181 Encounter for therapeutic drug level monitoring: Secondary | ICD-10-CM | POA: Diagnosis not present

## 2012-10-22 DIAGNOSIS — Z7901 Long term (current) use of anticoagulants: Secondary | ICD-10-CM | POA: Diagnosis not present

## 2012-10-22 DIAGNOSIS — Z471 Aftercare following joint replacement surgery: Secondary | ICD-10-CM | POA: Diagnosis not present

## 2012-10-22 DIAGNOSIS — Z96659 Presence of unspecified artificial knee joint: Secondary | ICD-10-CM | POA: Diagnosis not present

## 2012-10-22 DIAGNOSIS — Z4801 Encounter for change or removal of surgical wound dressing: Secondary | ICD-10-CM | POA: Diagnosis not present

## 2012-10-23 DIAGNOSIS — Z5181 Encounter for therapeutic drug level monitoring: Secondary | ICD-10-CM | POA: Diagnosis not present

## 2012-10-23 DIAGNOSIS — Z471 Aftercare following joint replacement surgery: Secondary | ICD-10-CM | POA: Diagnosis not present

## 2012-10-23 DIAGNOSIS — Z96659 Presence of unspecified artificial knee joint: Secondary | ICD-10-CM | POA: Diagnosis not present

## 2012-10-23 DIAGNOSIS — Z7901 Long term (current) use of anticoagulants: Secondary | ICD-10-CM | POA: Diagnosis not present

## 2012-10-23 DIAGNOSIS — Z4801 Encounter for change or removal of surgical wound dressing: Secondary | ICD-10-CM | POA: Diagnosis not present

## 2012-10-26 DIAGNOSIS — Z4801 Encounter for change or removal of surgical wound dressing: Secondary | ICD-10-CM | POA: Diagnosis not present

## 2012-10-26 DIAGNOSIS — Z471 Aftercare following joint replacement surgery: Secondary | ICD-10-CM | POA: Diagnosis not present

## 2012-10-26 DIAGNOSIS — Z96659 Presence of unspecified artificial knee joint: Secondary | ICD-10-CM | POA: Diagnosis not present

## 2012-10-26 DIAGNOSIS — Z7901 Long term (current) use of anticoagulants: Secondary | ICD-10-CM | POA: Diagnosis not present

## 2012-10-26 DIAGNOSIS — Z5181 Encounter for therapeutic drug level monitoring: Secondary | ICD-10-CM | POA: Diagnosis not present

## 2012-10-27 DIAGNOSIS — Z96659 Presence of unspecified artificial knee joint: Secondary | ICD-10-CM | POA: Diagnosis not present

## 2012-10-27 DIAGNOSIS — Z7901 Long term (current) use of anticoagulants: Secondary | ICD-10-CM | POA: Diagnosis not present

## 2012-10-27 DIAGNOSIS — Z4801 Encounter for change or removal of surgical wound dressing: Secondary | ICD-10-CM | POA: Diagnosis not present

## 2012-10-27 DIAGNOSIS — Z5181 Encounter for therapeutic drug level monitoring: Secondary | ICD-10-CM | POA: Diagnosis not present

## 2012-10-27 DIAGNOSIS — Z471 Aftercare following joint replacement surgery: Secondary | ICD-10-CM | POA: Diagnosis not present

## 2012-10-28 DIAGNOSIS — Z96659 Presence of unspecified artificial knee joint: Secondary | ICD-10-CM | POA: Diagnosis not present

## 2012-10-28 DIAGNOSIS — Z5181 Encounter for therapeutic drug level monitoring: Secondary | ICD-10-CM | POA: Diagnosis not present

## 2012-10-28 DIAGNOSIS — M1710 Unilateral primary osteoarthritis, unspecified knee: Secondary | ICD-10-CM | POA: Diagnosis not present

## 2012-10-28 DIAGNOSIS — Z471 Aftercare following joint replacement surgery: Secondary | ICD-10-CM | POA: Diagnosis not present

## 2012-10-28 DIAGNOSIS — Z4801 Encounter for change or removal of surgical wound dressing: Secondary | ICD-10-CM | POA: Diagnosis not present

## 2012-10-28 DIAGNOSIS — Z7901 Long term (current) use of anticoagulants: Secondary | ICD-10-CM | POA: Diagnosis not present

## 2012-10-29 DIAGNOSIS — Z471 Aftercare following joint replacement surgery: Secondary | ICD-10-CM | POA: Diagnosis not present

## 2012-10-29 DIAGNOSIS — Z7901 Long term (current) use of anticoagulants: Secondary | ICD-10-CM | POA: Diagnosis not present

## 2012-10-29 DIAGNOSIS — Z96659 Presence of unspecified artificial knee joint: Secondary | ICD-10-CM | POA: Diagnosis not present

## 2012-10-29 DIAGNOSIS — Z5181 Encounter for therapeutic drug level monitoring: Secondary | ICD-10-CM | POA: Diagnosis not present

## 2012-10-29 DIAGNOSIS — Z4801 Encounter for change or removal of surgical wound dressing: Secondary | ICD-10-CM | POA: Diagnosis not present

## 2012-10-30 DIAGNOSIS — Z4801 Encounter for change or removal of surgical wound dressing: Secondary | ICD-10-CM | POA: Diagnosis not present

## 2012-10-30 DIAGNOSIS — Z96659 Presence of unspecified artificial knee joint: Secondary | ICD-10-CM | POA: Diagnosis not present

## 2012-10-30 DIAGNOSIS — Z471 Aftercare following joint replacement surgery: Secondary | ICD-10-CM | POA: Diagnosis not present

## 2012-10-30 DIAGNOSIS — Z7901 Long term (current) use of anticoagulants: Secondary | ICD-10-CM | POA: Diagnosis not present

## 2012-10-30 DIAGNOSIS — Z5181 Encounter for therapeutic drug level monitoring: Secondary | ICD-10-CM | POA: Diagnosis not present

## 2012-11-02 DIAGNOSIS — Z96659 Presence of unspecified artificial knee joint: Secondary | ICD-10-CM | POA: Diagnosis not present

## 2012-11-02 DIAGNOSIS — Z4801 Encounter for change or removal of surgical wound dressing: Secondary | ICD-10-CM | POA: Diagnosis not present

## 2012-11-02 DIAGNOSIS — Z471 Aftercare following joint replacement surgery: Secondary | ICD-10-CM | POA: Diagnosis not present

## 2012-11-02 DIAGNOSIS — Z7901 Long term (current) use of anticoagulants: Secondary | ICD-10-CM | POA: Diagnosis not present

## 2012-11-02 DIAGNOSIS — Z5181 Encounter for therapeutic drug level monitoring: Secondary | ICD-10-CM | POA: Diagnosis not present

## 2012-11-03 DIAGNOSIS — Z5181 Encounter for therapeutic drug level monitoring: Secondary | ICD-10-CM | POA: Diagnosis not present

## 2012-11-03 DIAGNOSIS — Z4801 Encounter for change or removal of surgical wound dressing: Secondary | ICD-10-CM | POA: Diagnosis not present

## 2012-11-03 DIAGNOSIS — Z471 Aftercare following joint replacement surgery: Secondary | ICD-10-CM | POA: Diagnosis not present

## 2012-11-03 DIAGNOSIS — Z7901 Long term (current) use of anticoagulants: Secondary | ICD-10-CM | POA: Diagnosis not present

## 2012-11-03 DIAGNOSIS — Z96659 Presence of unspecified artificial knee joint: Secondary | ICD-10-CM | POA: Diagnosis not present

## 2012-11-05 DIAGNOSIS — M25669 Stiffness of unspecified knee, not elsewhere classified: Secondary | ICD-10-CM | POA: Diagnosis not present

## 2012-11-05 DIAGNOSIS — R269 Unspecified abnormalities of gait and mobility: Secondary | ICD-10-CM | POA: Diagnosis not present

## 2012-11-05 DIAGNOSIS — Z96659 Presence of unspecified artificial knee joint: Secondary | ICD-10-CM | POA: Diagnosis not present

## 2012-11-05 DIAGNOSIS — M6281 Muscle weakness (generalized): Secondary | ICD-10-CM | POA: Diagnosis not present

## 2012-11-10 DIAGNOSIS — R269 Unspecified abnormalities of gait and mobility: Secondary | ICD-10-CM | POA: Diagnosis not present

## 2012-11-10 DIAGNOSIS — M6281 Muscle weakness (generalized): Secondary | ICD-10-CM | POA: Diagnosis not present

## 2012-11-10 DIAGNOSIS — M25669 Stiffness of unspecified knee, not elsewhere classified: Secondary | ICD-10-CM | POA: Diagnosis not present

## 2012-11-10 DIAGNOSIS — Z96659 Presence of unspecified artificial knee joint: Secondary | ICD-10-CM | POA: Diagnosis not present

## 2012-11-13 DIAGNOSIS — Z96659 Presence of unspecified artificial knee joint: Secondary | ICD-10-CM | POA: Diagnosis not present

## 2012-11-13 DIAGNOSIS — M6281 Muscle weakness (generalized): Secondary | ICD-10-CM | POA: Diagnosis not present

## 2012-11-13 DIAGNOSIS — M25669 Stiffness of unspecified knee, not elsewhere classified: Secondary | ICD-10-CM | POA: Diagnosis not present

## 2012-11-13 DIAGNOSIS — R269 Unspecified abnormalities of gait and mobility: Secondary | ICD-10-CM | POA: Diagnosis not present

## 2012-11-16 DIAGNOSIS — R269 Unspecified abnormalities of gait and mobility: Secondary | ICD-10-CM | POA: Diagnosis not present

## 2012-11-16 DIAGNOSIS — M6281 Muscle weakness (generalized): Secondary | ICD-10-CM | POA: Diagnosis not present

## 2012-11-16 DIAGNOSIS — Z96659 Presence of unspecified artificial knee joint: Secondary | ICD-10-CM | POA: Diagnosis not present

## 2012-11-16 DIAGNOSIS — M25669 Stiffness of unspecified knee, not elsewhere classified: Secondary | ICD-10-CM | POA: Diagnosis not present

## 2012-11-19 DIAGNOSIS — Z96659 Presence of unspecified artificial knee joint: Secondary | ICD-10-CM | POA: Diagnosis not present

## 2012-11-19 DIAGNOSIS — R269 Unspecified abnormalities of gait and mobility: Secondary | ICD-10-CM | POA: Diagnosis not present

## 2012-11-19 DIAGNOSIS — M6281 Muscle weakness (generalized): Secondary | ICD-10-CM | POA: Diagnosis not present

## 2012-11-19 DIAGNOSIS — M25669 Stiffness of unspecified knee, not elsewhere classified: Secondary | ICD-10-CM | POA: Diagnosis not present

## 2012-11-24 DIAGNOSIS — R269 Unspecified abnormalities of gait and mobility: Secondary | ICD-10-CM | POA: Diagnosis not present

## 2012-11-24 DIAGNOSIS — M25669 Stiffness of unspecified knee, not elsewhere classified: Secondary | ICD-10-CM | POA: Diagnosis not present

## 2012-11-24 DIAGNOSIS — M6281 Muscle weakness (generalized): Secondary | ICD-10-CM | POA: Diagnosis not present

## 2012-11-27 DIAGNOSIS — R269 Unspecified abnormalities of gait and mobility: Secondary | ICD-10-CM | POA: Diagnosis not present

## 2012-11-27 DIAGNOSIS — M6281 Muscle weakness (generalized): Secondary | ICD-10-CM | POA: Diagnosis not present

## 2012-11-27 DIAGNOSIS — M25669 Stiffness of unspecified knee, not elsewhere classified: Secondary | ICD-10-CM | POA: Diagnosis not present

## 2012-11-30 DIAGNOSIS — M6281 Muscle weakness (generalized): Secondary | ICD-10-CM | POA: Diagnosis not present

## 2012-11-30 DIAGNOSIS — Z96659 Presence of unspecified artificial knee joint: Secondary | ICD-10-CM | POA: Diagnosis not present

## 2012-11-30 DIAGNOSIS — M25669 Stiffness of unspecified knee, not elsewhere classified: Secondary | ICD-10-CM | POA: Diagnosis not present

## 2012-11-30 DIAGNOSIS — R269 Unspecified abnormalities of gait and mobility: Secondary | ICD-10-CM | POA: Diagnosis not present

## 2012-12-03 DIAGNOSIS — Z96659 Presence of unspecified artificial knee joint: Secondary | ICD-10-CM | POA: Diagnosis not present

## 2012-12-03 DIAGNOSIS — R269 Unspecified abnormalities of gait and mobility: Secondary | ICD-10-CM | POA: Diagnosis not present

## 2012-12-03 DIAGNOSIS — M25669 Stiffness of unspecified knee, not elsewhere classified: Secondary | ICD-10-CM | POA: Diagnosis not present

## 2012-12-03 DIAGNOSIS — M6281 Muscle weakness (generalized): Secondary | ICD-10-CM | POA: Diagnosis not present

## 2012-12-07 DIAGNOSIS — M25669 Stiffness of unspecified knee, not elsewhere classified: Secondary | ICD-10-CM | POA: Diagnosis not present

## 2012-12-07 DIAGNOSIS — M6281 Muscle weakness (generalized): Secondary | ICD-10-CM | POA: Diagnosis not present

## 2012-12-07 DIAGNOSIS — Z96659 Presence of unspecified artificial knee joint: Secondary | ICD-10-CM | POA: Diagnosis not present

## 2012-12-07 DIAGNOSIS — R269 Unspecified abnormalities of gait and mobility: Secondary | ICD-10-CM | POA: Diagnosis not present

## 2012-12-11 DIAGNOSIS — M6281 Muscle weakness (generalized): Secondary | ICD-10-CM | POA: Diagnosis not present

## 2012-12-11 DIAGNOSIS — M25669 Stiffness of unspecified knee, not elsewhere classified: Secondary | ICD-10-CM | POA: Diagnosis not present

## 2012-12-11 DIAGNOSIS — R269 Unspecified abnormalities of gait and mobility: Secondary | ICD-10-CM | POA: Diagnosis not present

## 2012-12-11 DIAGNOSIS — Z96659 Presence of unspecified artificial knee joint: Secondary | ICD-10-CM | POA: Diagnosis not present

## 2012-12-14 DIAGNOSIS — R269 Unspecified abnormalities of gait and mobility: Secondary | ICD-10-CM | POA: Diagnosis not present

## 2012-12-14 DIAGNOSIS — M6281 Muscle weakness (generalized): Secondary | ICD-10-CM | POA: Diagnosis not present

## 2012-12-14 DIAGNOSIS — M25669 Stiffness of unspecified knee, not elsewhere classified: Secondary | ICD-10-CM | POA: Diagnosis not present

## 2012-12-14 DIAGNOSIS — Z96659 Presence of unspecified artificial knee joint: Secondary | ICD-10-CM | POA: Diagnosis not present

## 2012-12-15 ENCOUNTER — Ambulatory Visit (INDEPENDENT_AMBULATORY_CARE_PROVIDER_SITE_OTHER): Payer: Medicare Other

## 2012-12-15 DIAGNOSIS — Z23 Encounter for immunization: Secondary | ICD-10-CM

## 2012-12-22 ENCOUNTER — Other Ambulatory Visit: Payer: Self-pay

## 2012-12-22 DIAGNOSIS — Z1231 Encounter for screening mammogram for malignant neoplasm of breast: Secondary | ICD-10-CM

## 2013-01-14 ENCOUNTER — Other Ambulatory Visit: Payer: Self-pay | Admitting: Family Medicine

## 2013-01-21 ENCOUNTER — Ambulatory Visit
Admission: RE | Admit: 2013-01-21 | Discharge: 2013-01-21 | Disposition: A | Discharge status: Home / Self Care | Payer: Medicare Other | Source: Ambulatory Visit

## 2013-01-21 DIAGNOSIS — Z1231 Encounter for screening mammogram for malignant neoplasm of breast: Secondary | ICD-10-CM | POA: Diagnosis not present

## 2013-03-09 ENCOUNTER — Other Ambulatory Visit: Payer: Self-pay | Admitting: Family Medicine

## 2013-03-09 DIAGNOSIS — E7800 Pure hypercholesterolemia, unspecified: Secondary | ICD-10-CM

## 2013-03-15 ENCOUNTER — Other Ambulatory Visit (INDEPENDENT_AMBULATORY_CARE_PROVIDER_SITE_OTHER): Payer: Medicare Other

## 2013-03-15 DIAGNOSIS — E7800 Pure hypercholesterolemia, unspecified: Secondary | ICD-10-CM

## 2013-03-15 LAB — LDL CHOLESTEROL, DIRECT: Direct LDL: 151.3 mg/dL

## 2013-03-15 LAB — LIPID PANEL
HDL: 44.5 mg/dL (ref >39.00)
VLDL: 30.6 mg/dL (ref 0.0–40.0)

## 2013-04-03 ENCOUNTER — Other Ambulatory Visit: Payer: Self-pay | Admitting: Family Medicine

## 2013-04-05 ENCOUNTER — Encounter: Payer: Self-pay | Admitting: Internal Medicine

## 2013-04-05 ENCOUNTER — Ambulatory Visit (INDEPENDENT_AMBULATORY_CARE_PROVIDER_SITE_OTHER): Payer: Medicare Other | Admitting: Internal Medicine

## 2013-04-05 VITALS — BP 134/86 | HR 86 | Temp 97.9°F | Wt 304.2 lb

## 2013-04-05 DIAGNOSIS — L02419 Cutaneous abscess of limb, unspecified: Secondary | ICD-10-CM | POA: Diagnosis not present

## 2013-04-05 DIAGNOSIS — L03119 Cellulitis of unspecified part of limb: Principal | ICD-10-CM

## 2013-04-05 MED ORDER — SULFAMETHOXAZOLE-TMP DS 800-160 MG PO TABS: 1.0000 | ORAL_TABLET | Freq: Two times a day (BID) | ORAL | Status: DC

## 2013-04-05 NOTE — Telephone Encounter (Signed)
Dr. A pt 

## 2013-04-05 NOTE — Telephone Encounter (Signed)
Pt was seen by you on today, 04/05/13. Ok to refill?

## 2013-04-05 NOTE — Progress Notes (Signed)
Subjective:    Patient ID: [name], female    DOB: 08/02/1945, 68 y.o.   MRN: [id number]  HPI  Pt presents to the clinic today with c/o a lump on her right leg. She noticed this 2 days ago. She does report that it is warm to touch and tender. She denies any injury to the area.  She denies numbness and tingling in that leg.  Review of Systems  Past Medical History  Diagnosis Date  . Hypertension   . PONV (postoperative nausea and vomiting)   . Arthritis     "both knees" (07/07/2012)    Current Outpatient Prescriptions  Medication Sig Dispense Refill  . Calcium Carbonate-Vitamin D (CALCIUM 600+D) 600-400 MG-UNIT per tablet Take 1 tablet by mouth daily.        Marland Kitchen gabapentin (NEURONTIN) 100 MG capsule Take 1 capsule (100 mg total) by mouth 3 (three) times daily.  270 capsule  0  . hydrochlorothiazide (HYDRODIURIL) 25 MG tablet Take 1 tablet (25 mg total) by mouth daily.  90 tablet  0  . hydrochlorothiazide (HYDRODIURIL) 25 MG tablet TAKE 1 TABLET (25 MG TOTAL) BY MOUTH DAILY.  30 tablet  5  . methocarbamol (ROBAXIN) 500 MG tablet Take 1 tablet (500 mg total) by mouth every 6 (six) hours as needed.  30 tablet  0  . oxyCODONE (OXY IR/ROXICODONE) 5 MG immediate release tablet Take 1-2 tablets (5-10 mg total) by mouth every 3 (three) hours as needed.  60 tablet  0  . ramipril (ALTACE) 5 MG capsule Take 5 mg by mouth daily.      Marland Kitchen warfarin (COUMADIN) 5 MG tablet Take 1 tablet (5 mg total) by mouth daily.  30 tablet  1   No current facility-administered medications for this visit.    No Known Allergies  No family history on file.  History   Social History  . Marital Status: Married    Spouse Name: N/A    Number of Children: N/A  . Years of Education: N/A   Occupational History  . Not on file.   Social History Main Topics  . Smoking status: Never Smoker   . Smokeless tobacco: Never Used  . Alcohol Use: No  . Drug Use: No  . Sexual Activity: Not Currently   Other  Topics Concern  . Not on file   Social History Narrative   Desires CPR.   Would want life support but not for prolonged period of time.   Does not want feeding tubes.   Has discussed this with her husband, [name].     Constitutional: Denies fever, malaise, fatigue, headache or abrupt weight changes.  Respiratory: Denies difficulty breathing, shortness of breath, cough or sputum production.   Cardiovascular: Denies chest pain, chest tightness, palpitations or swelling in the hands or feet.  Skin: Denies rashes or ulcercations.    No other specific complaints in a complete review of systems (except as listed in HPI above).     Objective:   Physical Exam  BP 134/86  Pulse 86  Temp(Src) 97.9 F (36.6 C) (Oral)  Wt 304 lb 4 oz (138.007 kg)  SpO2 96% Wt Readings from Last 3 Encounters:  04/05/13 304 lb 4 oz (138.007 kg)  10/13/12 297 lb (134.718 kg)  10/13/12 297 lb (134.718 kg)    General: Appears her stated age, obese but  well developed, well nourished in NAD. Skin: Warm, dry and intact. Small dime size hard abscess noted on right distal shin, surrounded  by area of cellulitis. Cardiovascular: Normal rate and rhythm. S1,S2 noted.  No murmur, rubs or gallops noted. No JVD or BLE edema. No carotid bruits noted. Pulmonary/Chest: Normal effort and positive vesicular breath sounds. No respiratory distress. No wheezes, rales or ronchi noted.    BMET    Component Value Date/Time   NA 135 10/14/2012 0450   K 3.9 10/14/2012 0450   CL 100 10/14/2012 0450   CO2 27 10/14/2012 0450   GLUCOSE 142* 10/14/2012 0450   BUN 25* 10/14/2012 0450   CREATININE 1.09 10/14/2012 0450   CALCIUM 8.5 10/14/2012 0450   GFRNONAA 52* 10/14/2012 0450   GFRAA 60* 10/14/2012 0450    Lipid Panel     Component Value Date/Time   CHOL 212* 03/15/2013 0858   TRIG 153.0* 03/15/2013 0858   HDL 44.50 03/15/2013 0858   CHOLHDL 5 03/15/2013 0858   VLDL 30.6 03/15/2013 0858   LDLCALC 114* 08/21/2010 0851     CBC    Component Value Date/Time   WBC 6.5 10/16/2012 0455   RBC 3.47* 10/16/2012 0455   HGB 9.1* 10/16/2012 0455   HCT 27.3* 10/16/2012 0455   PLT 120* 10/16/2012 0455   MCV 78.7 10/16/2012 0455   MCH 26.2 10/16/2012 0455   MCHC 33.3 10/16/2012 0455   RDW 14.3 10/16/2012 0455   LYMPHSABS 2.4 02/05/2012 0826   MONOABS 0.8 02/05/2012 0826   EOSABS 0.2 02/05/2012 0826   BASOSABS 0.1 02/05/2012 0826    Hgb A1C Lab Results  Component Value Date   HGBA1C 5.8 02/05/2012         Assessment & Plan:   Cellulitis and abscess of right lower leg:  Information provided for care- warm compresses, elevation, etc eRx for Septra BID x 5 days If you do not see a noticeable improvement or it seems to worsen, RTC in 2 days

## 2013-04-05 NOTE — Patient Instructions (Signed)
Cellulitis Cellulitis is an infection of the skin and the tissue beneath it. The infected area is usually red and tender. Cellulitis occurs most often in the arms and lower legs.  CAUSES  Cellulitis is caused by bacteria that enter the skin through cracks or cuts in the skin. The most common types of bacteria that cause cellulitis are Staphylococcus and Streptococcus. SYMPTOMS   Redness and warmth.  Swelling.  Tenderness or pain.  Fever. DIAGNOSIS  Your caregiver can usually determine what is wrong based on a physical exam. Blood tests may also be done. TREATMENT  Treatment usually involves taking an antibiotic medicine. HOME CARE INSTRUCTIONS   Take your antibiotics as directed. Finish them even if you start to feel better.  Keep the infected arm or leg elevated to reduce swelling.  Apply a warm cloth to the affected area up to 4 times per day to relieve pain.  Only take over-the-counter or prescription medicines for pain, discomfort, or fever as directed by your caregiver.  Keep all follow-up appointments as directed by your caregiver. SEEK MEDICAL CARE IF:   You notice red streaks coming from the infected area.  Your red area gets larger or turns dark in color.  Your bone or joint underneath the infected area becomes painful after the skin has healed.  Your infection returns in the same area or another area.  You notice a swollen bump in the infected area.  You develop new symptoms. SEEK IMMEDIATE MEDICAL CARE IF:   You have a fever.  You feel very sleepy.  You develop vomiting or diarrhea.  You have a general ill feeling (malaise) with muscle aches and pains. MAKE SURE YOU:   Understand these instructions.  Will watch your condition.  Will get help right away if you are not doing well or get worse. Document Released: 12/19/2004 Document Revised: 09/10/2011 Document Reviewed: 05/27/2011 Exit[employer] Patient Information 2014 [employer].  

## 2013-04-05 NOTE — Progress Notes (Signed)
Pre-visit discussion using our clinic review tool. No additional management support is needed unless otherwise documented below in the visit note.  

## 2013-04-05 NOTE — Telephone Encounter (Signed)
Pt request status of ramipril refill; pt request cb when med refilled.

## 2013-04-06 MED ORDER — RAMIPRIL 5 MG PO CAPS: 5.0000 mg | ORAL_CAPSULE | Freq: Every day | ORAL | Status: DC

## 2013-04-06 NOTE — Telephone Encounter (Signed)
Rx refilled.

## 2013-04-06 NOTE — Telephone Encounter (Signed)
Spoke to pt and informed her that Rx has been sent to pharmacy

## 2013-04-06 NOTE — Addendum Note (Signed)
Addended by: Lucille Passy on: 04/06/2013 10:01 AM   Modules accepted: Orders

## 2013-07-01 ENCOUNTER — Encounter: Payer: Self-pay | Admitting: Internal Medicine

## 2013-07-12 ENCOUNTER — Other Ambulatory Visit: Payer: Self-pay | Admitting: *Deleted

## 2013-07-12 MED ORDER — HYDROCHLOROTHIAZIDE 25 MG PO TABS: 25.0000 mg | ORAL_TABLET | Freq: Every day | ORAL | Status: DC

## 2013-07-12 MED ORDER — RAMIPRIL 5 MG PO CAPS: 5.0000 mg | ORAL_CAPSULE | Freq: Every day | ORAL | Status: DC

## 2013-07-23 ENCOUNTER — Other Ambulatory Visit: Payer: Self-pay | Admitting: Family Medicine

## 2013-08-18 ENCOUNTER — Telehealth: Payer: Self-pay

## 2013-08-18 ENCOUNTER — Ambulatory Visit (AMBULATORY_SURGERY_CENTER): Payer: Self-pay

## 2013-08-18 ENCOUNTER — Other Ambulatory Visit: Payer: Self-pay | Admitting: *Deleted

## 2013-08-18 ENCOUNTER — Encounter: Payer: Self-pay | Admitting: *Deleted

## 2013-08-18 VITALS — Ht 63.0 in | Wt 315.6 lb

## 2013-08-18 DIAGNOSIS — Z800 Family history of malignant neoplasm of digestive organs: Secondary | ICD-10-CM

## 2013-08-18 MED ORDER — MOVIPREP 100 G PO SOLR: 1.0000 | Freq: Once | ORAL | Status: DC

## 2013-08-18 NOTE — Telephone Encounter (Signed)
BMI too high.  Needs [facility] appt.  Previsit done.

## 2013-08-18 NOTE — Progress Notes (Signed)
No allergies to eggs or soy No past problems with anesthesia except PONV (general anesthesia) No home oxygen No diet/weight loss meds Has email.  Emmi instructions given for colonoscopy. 

## 2013-08-18 NOTE — Telephone Encounter (Signed)
Spoke with Dr. Olevia Perches and may do procedure next week. Scheduled on 08/27/13 at 10:45 AM at [place] [facility]). Patient aware. New instructions mailed to patient.

## 2013-08-18 NOTE — Telephone Encounter (Signed)
Orders in EPIC

## 2013-08-18 NOTE — Telephone Encounter (Signed)
Pt needs [facility] appt.  BMI>55.  Pt has had previsit.  Please call her with appt date and time.

## 2013-08-26 NOTE — Interval H&P Note (Signed)
History and Physical Interval Note:  08/26/2013 10:13 PM  [name]  has presented today for surgery, with the diagnosis of recall colonoscopy: family hx colon cancer. BMI over 50.  The various methods of treatment have been discussed with the patient and family. After consideration of risks, benefits and other options for treatment, the patient has consented to  Procedure(s): COLONOSCOPY (N/A) as a surgical intervention .  The patient's history has been reviewed, patient examined, no change in status, stable for surgery.  I have reviewed the patient's chart and labs.  Questions were answered to the patient's satisfaction.     [name]

## 2013-08-26 NOTE — H&P (View-Only) (Signed)
No allergies to eggs or soy No past problems with anesthesia except PONV (general anesthesia) No home oxygen No diet/weight loss meds Has email.  Emmi instructions given for colonoscopy.

## 2013-08-27 ENCOUNTER — Ambulatory Visit (HOSPITAL_COMMUNITY)
Admission: RE | Admit: 2013-08-27 | Discharge: 2013-08-27 | Disposition: A | Discharge status: Home / Self Care | Payer: Medicare Other | Source: Ambulatory Visit | Attending: Internal Medicine | Admitting: Internal Medicine

## 2013-08-27 ENCOUNTER — Encounter (HOSPITAL_COMMUNITY): Payer: Self-pay | Admitting: *Deleted

## 2013-08-27 ENCOUNTER — Encounter (HOSPITAL_COMMUNITY)
Admission: RE | Disposition: A | Discharge status: Home / Self Care | Payer: Self-pay | Source: Ambulatory Visit | Attending: Internal Medicine

## 2013-08-27 DIAGNOSIS — Z1211 Encounter for screening for malignant neoplasm of colon: Secondary | ICD-10-CM

## 2013-08-27 DIAGNOSIS — Z800 Family history of malignant neoplasm of digestive organs: Secondary | ICD-10-CM

## 2013-08-27 HISTORY — PX: COLONOSCOPY: SHX5424

## 2013-08-27 SURGERY — COLONOSCOPY
Anesthesia: Moderate Sedation

## 2013-08-27 MED ORDER — DIPHENHYDRAMINE HCL 50 MG/ML IJ SOLN
INTRAMUSCULAR | Status: AC
  Filled 2013-08-27: qty 1

## 2013-08-27 MED ORDER — SODIUM CHLORIDE 0.9 % IV SOLN
INTRAVENOUS | Status: DC
  Administered 2013-08-27: 500 mL via INTRAVENOUS

## 2013-08-27 MED ORDER — FENTANYL CITRATE 0.05 MG/ML IJ SOLN
INTRAMUSCULAR | Status: AC
  Filled 2013-08-27: qty 2

## 2013-08-27 MED ORDER — FENTANYL CITRATE 0.05 MG/ML IJ SOLN
INTRAMUSCULAR | Status: DC | PRN
  Administered 2013-08-27 (×3): 25 ug via INTRAVENOUS

## 2013-08-27 MED ORDER — MIDAZOLAM HCL 5 MG/5ML IJ SOLN
INTRAMUSCULAR | Status: DC | PRN
  Administered 2013-08-27 (×2): 2 mg via INTRAVENOUS
  Administered 2013-08-27: 1 mg via INTRAVENOUS
  Administered 2013-08-27: 2 mg via INTRAVENOUS

## 2013-08-27 MED ORDER — MIDAZOLAM HCL 10 MG/2ML IJ SOLN
INTRAMUSCULAR | Status: AC
  Filled 2013-08-27: qty 2

## 2013-08-27 MED ORDER — LACTATED RINGERS IV SOLN: INTRAVENOUS | Status: DC

## 2013-08-27 NOTE — Discharge Instructions (Signed)
Colorectal Cancer Screening Colorectal cancer screening is done to detect early disease. Colorectal refers to the colon and rectum. The colon and rectum are located at the end of the large intestine (digestive system), and carry your bowel movements out of the body. Screening may be done even if you are not experiencing symptoms.  Colorectal cancer screening checks for:  Polyps. These are small growths in the lining of the colon that can turn cancerous.  Cancer that is already growing. Cancer is a cluster of abnormal cells that can cause problems in the body. REASONS FOR COLORECTAL CANCER SCREENING  It is common for polyps to form in the lining of the colon, especially in older people. These polyps can be cancerous or become cancerous.  Caught early, colorectal cancer is treatable.  Cancer can be life threatening. Detecting or preventing cancer early can save your life and allow you to enjoy life longer. TYPES OF SCREENING  Fecal occult blood testing. A stool sample is examined for blood in the laboratory.  Sigmoidoscopy. A sigmoidoscope is used to examine the rectum and lower colon. A sigmoidoscope is a flexible tube with a camera that is inserted through your anus to examine your lower rectum.  Colonoscopy. The longer colonoscope is used to examine the entire colon. A colonoscope is also a thin, flexible tube with a camera. This test examines the colon and rectum. Other tests include:  Digital rectal exam.  Barium enema.  Stool DNA test.  Virtual colonoscopy is the use of computerized X-ray scan (computed tomography, CT) to take X-ray images of your colon. WHO SHOULD HAVE COLORECTAL CANCER SCREENING?  Screening is recommended for all adults aged 76 to 61 years.  Screening is generally done every 5 to 10 years or more frequently if you have a family history or symptoms.  Screening is rarely recommended in adults aged 53 to 10 years. Screening is not recommended in adults aged 53  years and older. Your caregiver may recommend screening at a younger age and more frequent screening if you have:  A history of colorectal cancer or polyps.  Family members with histories of colorectal cancer or polyps.  Inflammatory bowel disease, such as ulcerative colitis or Crohn's disease.  A type of hereditary colon cancer syndrome. Talk with your caregiver about any symptoms, personal and family history. SYMPTOMS OF COLORECTAL CANCER It is important to discuss the following symptoms with your caregiver. These symptoms may be the result of other conditions and may be easily treated:  Rectal bleeding.  Blood in your stool.  Changes in bowel movements (hard or loose stools). These changes may last several weeks.  Abdominal cramping.  Feeling the pressure to have a bowel movement when there is no bowel movement.  Feeling tired or weak.  Unexplained weight loss.  Unexplained low red blood cell count. This may also be called iron deficiency anemia. HOME CARE INSTRUCTIONS   Follow up with your caregiver as directed.  Follow all instructions for preparation before your test as well as after. PREVENTION  Following healthy lifestyle habits each day can reduce your chance of getting colorectal cancer and many other types of cancer:  Eat a healthy, well-balanced diet rich in fruits and vegetables and low in fats, sugars and cholesterol.  Stay active. Try to exercise at least 4 to 6 times per week for 30 minutes.  Maintain a healthy weight. Ask your caregiver what a healthy weight range is for you.  Women should only drink 1 alcoholic drink per day.  Men should only drink 2 alcoholic drinks per day.  Quit smoking. SEEK MEDICAL CARE IF:   You experience abdominal or rectal symptoms (see Symptoms of Colorectal Cancer).  Your gastrointestinal issues (constipation, diarrhea) do not go away as expected.  You have questions or concerns. FOR MORE INFORMATION  American Academy  of Family Physicians www.familydoctor.org  Centers for Disease Control and Prevention http://www.wolf.info/  [place] Preventive Services Task Force www.uspreventiveservicestaskforce.[place] www.cancer.org MAKE SURE YOU:   Understand these instructions.  Will watch your condition.  Will get help right away if you are not doing well or get worse. Always follow up with your caregiver to find out the results of your tests. Not all test results may be available during your visit. If your test results are not back during the visit, make an appointment with your caregiver to find out the results. Do not assume everything is normal if you have not heard from your caregiver or the medical facility. It is important for you to follow up on all of your test results.  Document Released: 08/29/2009 Document Revised: 06/03/2011 Document Reviewed: 08/29/2009 [facility] Patient Information 2014 [place]. PATIENT INSTRUCTIONS POST-ANESTHESIA  IMMEDIATELY FOLLOWING SURGERY:  Do not drive or operate machinery for the first twenty four hours after surgery.  Do not make any important decisions for twenty four hours after surgery or while taking narcotic pain medications or sedatives.  If you develop intractable nausea and vomiting or a severe headache please notify your doctor immediately.  FOLLOW-UP:  Please make an appointment with your surgeon as instructed. You do not need to follow up with anesthesia unless specifically instructed to do so.  WOUND CARE INSTRUCTIONS (if applicable):  Keep a dry clean dressing on the anesthesia/puncture wound site if there is drainage.  Once the wound has quit draining you may leave it open to air.  Generally you should leave the bandage intact for twenty four hours unless there is drainage.  If the epidural site drains for more than 36-48 hours please call the anesthesia department.  QUESTIONS?:  Please feel free to call your physician or the hospital operator if  you have any questions, and they will be happy to assist you.

## 2013-08-27 NOTE — Interval H&P Note (Signed)
History and Physical Interval Note:  08/27/2013 10:37 AM  [name]  has presented today for surgery, with the diagnosis of recall colonoscopy: family hx colon cancer. BMI over 50.  The various methods of treatment have been discussed with the patient and family. After consideration of risks, benefits and other options for treatment, the patient has consented to  Procedure(s): COLONOSCOPY (N/A) as a surgical intervention .  The patient's history has been reviewed, patient examined, no change in status, stable for surgery.  I have reviewed the patient's chart and labs.  Questions were answered to the patient's satisfaction.     [name]

## 2013-08-27 NOTE — Op Note (Signed)
[facility] [place], [address]   COLONOSCOPY PROCEDURE REPORT  PATIENT: [name], Alyssa  MR#: [id number] BIRTHDATE: 09-03-1945 , 10  yrs. old GENDER: Female ENDOSCOPIST: [name], MD REFERRED J[facility]:ERDEY Aron, M.D. PROCEDURE DATE:  08/27/2013 PROCEDURE:   Colonoscopy, screening First Screening Colonoscopy - Avg.  risk and is 50 yrs.  old or older - No.  Prior Negative Screening - Now for repeat screening. Above average risk  History of Adenoma - Now for follow-up colonoscopy & has been > or = to 3 yrs.  N/A  Polyps Removed Today? No.  Recommend repeat exam, <10 yrs? Yes.  High risk (family or personal hx). ASA CLASS:   Class III INDICATIONS:history all for colon cancer in both parents.  Last colonoscopy October 2008 was normal. MEDICATIONS: These medications were titrated to patient response per physician's verbal order, Fentanyl 75 mcg IV, and Versed 7 mg IV  DESCRIPTION OF PROCEDURE:   After the risks benefits and alternatives of the procedure were thoroughly explained, informed consent was obtained.  A digital rectal exam revealed no abnormalities of the rectum.   The Pentax Ped Colon [id number] endoscope was introduced through the anus and advanced to the cecum, which was identified by both the appendix and ileocecal valve. No adverse events experienced.   The quality of the prep was excellent, using MoviPrep  The instrument was then slowly withdrawn as the colon was fully examined.      COLON FINDINGS: A normal appearing cecum, ileocecal valve, and appendiceal orifice were identified.  The ascending, hepatic flexure, transverse, splenic flexure, descending, sigmoid colon and rectum appeared unremarkable.  No polyps or cancers were seen. Retroflexed views revealed no abnormalities. The time to cecum=3. minutes 22 seconds.  Withdrawal time=6 minutes 44 seconds.  The scope was withdrawn and the procedure completed. COMPLICATIONS: There were  no complications.  ENDOSCOPIC IMPRESSION: Normal colon  RECOMMENDATIONS: high fiber diet Recall colonoscopy in 5 years   eSigned:  [name], MD 08/27/2013 12:25 PM   cc:

## 2013-08-30 ENCOUNTER — Encounter (HOSPITAL_COMMUNITY): Payer: Self-pay | Admitting: Internal Medicine

## 2013-09-01 ENCOUNTER — Encounter: Payer: Medicare Other | Admitting: Internal Medicine

## 2013-09-01 ENCOUNTER — Other Ambulatory Visit: Payer: Self-pay | Admitting: Family Medicine

## 2013-09-01 DIAGNOSIS — R7309 Other abnormal glucose: Secondary | ICD-10-CM

## 2013-09-01 DIAGNOSIS — E7800 Pure hypercholesterolemia, unspecified: Secondary | ICD-10-CM

## 2013-09-01 DIAGNOSIS — Z0000 Encounter for general adult medical examination without abnormal findings: Secondary | ICD-10-CM

## 2013-09-01 DIAGNOSIS — I10 Essential (primary) hypertension: Secondary | ICD-10-CM

## 2013-09-06 ENCOUNTER — Other Ambulatory Visit (INDEPENDENT_AMBULATORY_CARE_PROVIDER_SITE_OTHER): Payer: Medicare Other

## 2013-09-06 DIAGNOSIS — Z0000 Encounter for general adult medical examination without abnormal findings: Secondary | ICD-10-CM | POA: Diagnosis not present

## 2013-09-06 DIAGNOSIS — E7800 Pure hypercholesterolemia, unspecified: Secondary | ICD-10-CM | POA: Diagnosis not present

## 2013-09-06 LAB — COMPREHENSIVE METABOLIC PANEL
ALT: 21 U/L (ref 0–35)
AST: 22 U/L (ref 0–37)
Albumin: 3.9 g/dL (ref 3.5–5.2)
Alkaline Phosphatase: 54 U/L (ref 39–117)
BILIRUBIN TOTAL: 0.5 mg/dL (ref 0.2–1.2)
BUN: 22 mg/dL (ref 6–23)
CHLORIDE: 102 meq/L (ref 96–112)
CO2: 28 mEq/L (ref 19–32)
Calcium: 9.2 mg/dL (ref 8.4–10.5)
Creatinine, Ser: 1 mg/dL (ref 0.4–1.2)
GFR: 56.78 mL/min — ABNORMAL LOW (ref >60.00)
Glucose, Bld: 117 mg/dL — ABNORMAL HIGH (ref 70–99)
Potassium: 4 mEq/L (ref 3.5–5.1)
Sodium: 139 mEq/L (ref 135–145)
TOTAL PROTEIN: 6.6 g/dL (ref 6.0–8.3)

## 2013-09-06 LAB — CBC WITH DIFFERENTIAL/PLATELET
Basophils Absolute: 0 10*3/uL (ref 0.0–0.1)
Basophils Relative: 0.8 % (ref 0.0–3.0)
EOS ABS: 0.4 10*3/uL (ref 0.0–0.7)
Eosinophils Relative: 6.7 % — ABNORMAL HIGH (ref 0.0–5.0)
HCT: 41.3 % (ref 36.0–46.0)
HEMOGLOBIN: 13.7 g/dL (ref 12.0–15.0)
Lymphocytes Relative: 42.5 % (ref 12.0–46.0)
Lymphs Abs: 2.3 10*3/uL (ref 0.7–4.0)
MCHC: 33.2 g/dL (ref 30.0–36.0)
MCV: 82.8 fl (ref 78.0–100.0)
MONOS PCT: 9.5 % (ref 3.0–12.0)
Monocytes Absolute: 0.5 10*3/uL (ref 0.1–1.0)
NEUTROS ABS: 2.2 10*3/uL (ref 1.4–7.7)
NEUTROS PCT: 40.5 % — AB (ref 43.0–77.0)
PLATELETS: 194 10*3/uL (ref 150.0–400.0)
RBC: 4.99 Mil/uL (ref 3.87–5.11)
RDW: 14.7 % (ref 11.5–15.5)
WBC: 5.4 10*3/uL (ref 4.0–10.5)

## 2013-09-06 LAB — LIPID PANEL
CHOL/HDL RATIO: 5
Cholesterol: 209 mg/dL — ABNORMAL HIGH (ref 0–200)
HDL: 46.2 mg/dL (ref >39.00)
LDL Cholesterol: 121 mg/dL — ABNORMAL HIGH (ref 0–99)
NONHDL: 162.8
Triglycerides: 207 mg/dL — ABNORMAL HIGH (ref 0.0–149.0)
VLDL: 41.4 mg/dL — AB (ref 0.0–40.0)

## 2013-09-06 LAB — TSH: TSH: 0.48 u[IU]/mL (ref 0.35–4.50)

## 2013-09-10 NOTE — Interval H&P Note (Signed)
History and Physical Interval Note:  09/10/2013 5:14 PM  [name]  has presented today for surgery, with the diagnosis of recall colonoscopy: family hx colon cancer. BMI over 50.  The various methods of treatment have been discussed with the patient and family. After consideration of risks, benefits and other options for treatment, the patient has consented to  Procedure(s): COLONOSCOPY (N/A) as a surgical intervention .  The patient's history has been reviewed, patient examined, no change in status, stable for surgery.  I have reviewed the patient's chart and labs.  Questions were answered to the patient's satisfaction.     Delfin Edis

## 2013-09-13 ENCOUNTER — Encounter: Payer: Self-pay | Admitting: Family Medicine

## 2013-09-13 ENCOUNTER — Other Ambulatory Visit: Payer: Self-pay | Admitting: Family Medicine

## 2013-09-13 ENCOUNTER — Ambulatory Visit (INDEPENDENT_AMBULATORY_CARE_PROVIDER_SITE_OTHER): Payer: Medicare Other | Admitting: Family Medicine

## 2013-09-13 DIAGNOSIS — R7309 Other abnormal glucose: Secondary | ICD-10-CM

## 2013-09-13 DIAGNOSIS — M25561 Pain in right knee: Secondary | ICD-10-CM

## 2013-09-13 DIAGNOSIS — I10 Essential (primary) hypertension: Secondary | ICD-10-CM

## 2013-09-13 DIAGNOSIS — Z0000 Encounter for general adult medical examination without abnormal findings: Secondary | ICD-10-CM

## 2013-09-13 DIAGNOSIS — E7800 Pure hypercholesterolemia, unspecified: Secondary | ICD-10-CM

## 2013-09-13 DIAGNOSIS — Z800 Family history of malignant neoplasm of digestive organs: Secondary | ICD-10-CM

## 2013-09-13 DIAGNOSIS — Z23 Encounter for immunization: Secondary | ICD-10-CM | POA: Diagnosis not present

## 2013-09-13 LAB — HEMOGLOBIN A1C: HEMOGLOBIN A1C: 5.9 % (ref 4.6–6.5)

## 2013-09-13 NOTE — Assessment & Plan Note (Signed)
Improving since she has been exercising more. Declines nutritionist referral and bariatric surgery referral at this time.

## 2013-09-13 NOTE — Addendum Note (Signed)
Addended by: Modena Nunnery on: 09/13/2013 10:21 AM   Modules accepted: Orders

## 2013-09-13 NOTE — Assessment & Plan Note (Signed)
Colonoscopy UTD. 

## 2013-09-13 NOTE — Assessment & Plan Note (Signed)
Improved s/p bilateral TKA.

## 2013-09-13 NOTE — Progress Notes (Signed)
68 yo here medicare wellness visit.  I have personally reviewed the Medicare Annual Wellness questionnaire and have noted 1. The patient's medical and social history 2. Their use of alcohol, tobacco or illicit drugs 3. Their current medications and supplements 4. The patient's functional ability including ADL's, fall risks, home safety risks and hearing or visual             impairment. 5. Diet and physical activities 6. Evidence for depression or mood disorders  End of life wishes discussed and updated in Social History.  S/p bilateral TKA- right on 07/07/12, left on 10/13/12 (Dr. Marlou Sa).   HTN- Has been controlled on Altace 5 mg adn HCTZ 25 mg.     No CP, SOB, LE edema.   Well woman- G3P3, no h/o abnormal pap smears.  Last pap smear was in 08/2012. Denies any post menopausal bleeding. Colonoscopy 08/27/13- Dr. Olevia Perches- 5 year recall due to family history. Mammogram 01/22/13 Td 09/03/2005 Zoster 05/01/09 Pneumovax 07/08/12  No personal or family h/o uterine, breast, or ovarian CA.  Morbid obesity- has actually lost 12 pounds in last couple of weeks.  Drinking more water, swimming now and watching what she eats. Wt Readings from Last 3 Encounters:  09/13/13 302 lb 4 oz (137.1 kg)  08/27/13 315 lb (142.883 kg)  08/27/13 315 lb (142.883 kg)   Hyperglycemia- was fasting for this sample.  Denies any increased thirst or urination.  Patient Active Problem List   Diagnosis Date Noted  . Medicare annual wellness visit, subsequent 09/13/2013  . Family history of malignant neoplasm of gastrointestinal tract 08/27/2013  . KNEE PAIN, BILATERAL 05/01/2009  . PURE HYPERCHOLESTEROLEMIA 02/08/2008  . HYPERGLYCEMIA 12/19/2006  . OBESITY, MORBID 07/10/2006  . HYPERTENSION 09/06/2005   Past Medical History  Diagnosis Date  . Hypertension   . PONV (postoperative nausea and vomiting)   . Arthritis     "both knees" (07/07/2012)   Past Surgical History  Procedure Laterality Date  . Vaginal  delivery      X3  . Total knee arthroplasty Right 07/07/2012  . Joint replacement    . Total knee arthroplasty Right 07/07/2012    Procedure: TOTAL KNEE ARTHROPLASTY;  Surgeon: Meredith Pel, MD;  Location: [place];  Service: Orthopedics;  Laterality: Right;  Right Total Knee Arthroplasty  . Total knee arthroplasty Left 10/13/2012    Procedure: TOTAL KNEE ARTHROPLASTY;  Surgeon: Meredith Pel, MD;  Location: [facility];  Service: Orthopedics;  Laterality: Left;  . Colonoscopy N/A 08/27/2013    Procedure: COLONOSCOPY;  Surgeon: [name], MD;  Location: [facility];  Service: Endoscopy;  Laterality: N/A;   History  Substance Use Topics  . Smoking status: Never Smoker   . Smokeless tobacco: Never Used  . Alcohol Use: No   Family History  Problem Relation Age of Onset  . Colon cancer Mother   . Colon cancer Father    No Known Allergies Current Outpatient Prescriptions on File Prior to Visit  Medication Sig Dispense Refill  . Calcium Carbonate-Vitamin D (CALCIUM 600+D) 600-400 MG-UNIT per tablet Take 1 tablet by mouth daily.        . hydrochlorothiazide (HYDRODIURIL) 25 MG tablet Take 1 tablet (25 mg total) by mouth daily.  90 tablet  0  . MOVIPREP 100 G SOLR Take 1 kit (200 g total) by mouth once.  1 kit  0  . Omega-3 Fatty Acids (FISH OIL PO) Take by mouth.      . ramipril (ALTACE)  5 MG capsule Take 1 capsule (5 mg total) by mouth daily.  90 capsule  0   No current facility-administered medications on file prior to visit.   The PMH, PSH, Social History, Family History, Medications, and allergies have been reviewed in [facility], and have been updated if relevant.  The PMH, PSH, Social History, Family History, Medications, and allergies have been reviewed in [facility], and have been updated if relevant.   Review of Systems       See HPI Patient reports no  vision/ hearing changes,anorexia, weight change, fever ,adenopathy, persistant / recurrent hoarseness, swallowing issues, chest pain,  edema,persistant / recurrent cough, hemoptysis, dyspnea(rest, exertional, paroxysmal nocturnal), gastrointestinal  bleeding (melena, rectal bleeding), abdominal pain, excessive heart burn, GU symptoms(dysuria, hematuria, pyuria, voiding/incontinence  Issues) syncope, focal weakness, severe memory loss, concerning skin lesions, depression, anxiety, abnormal bruising/bleeding, major joint swelling, breast masses or abnormal vaginal bleeding.     Physical Exam BP 124/78  Pulse 73  Temp(Src) 98 F (36.7 C) (Oral)  Ht 5' 3.75" (1.619 m)  Wt 302 lb 4 oz (137.1 kg)  BMI 52.31 kg/m2  SpO2 98%  General:  alert, well-developed, well-nourished, and well-hydrated.  morbidly obese  Head:  normocephalic and atraumatic.   Eyes:  vision grossly intact, pupils equal, pupils round, and pupils reactive to light.   Ears:  R ear normal and L ear normal.   Nose:  no external deformity.   Mouth:  good dentition.   Neck:  No deformities, masses, or tenderness noted. Breasts:  No mass, nodules, thickening, tenderness, bulging, retraction, inflamation, nipple discharge or skin changes noted.   Lungs:  Normal respiratory effort, chest expands symmetrically. Lungs are clear to auscultation, no crackles or wheezes. Heart:  Normal rate and regular rhythm. S1 and S2 normal without gallop, murmur, click, rub or other extra sounds. Abdomen:  Bowel sounds positive,abdomen soft and non-tender without masses, organomegaly or hernias noted.oted of thoracic or lumbar spine.   Extremities:  No clubbing, cyanosis, edema, or deformity noted with normal full range of motion of all joints.   Neurologic:  alert & oriented X3 and gait normal.   Skin:  Intact without suspicious lesions or rashes Cervical Nodes:  No lymphadenopathy noted Axillary Nodes:  No palpable lymphadenopathy Psych:  Cognition and judgment appear intact. Alert and cooperative with normal attention span and concentration. No apparent delusions, illusions,  hallucinations

## 2013-09-13 NOTE — Assessment & Plan Note (Signed)
Stable on current rx.  

## 2013-09-13 NOTE — Progress Notes (Signed)
Pre visit review using our clinic review tool, if applicable. No additional management support is needed unless otherwise documented below in the visit note. 

## 2013-09-13 NOTE — Assessment & Plan Note (Signed)
The patients weight, height, BMI and visual acuity have been recorded in the chart I have made referrals, counseling and provided education to the patient based review of the above and I have provided the [name] with a written personalized care plan for preventive services.  Prevnar 13 today.

## 2013-09-13 NOTE — Assessment & Plan Note (Signed)
Check a1c today

## 2013-09-13 NOTE — Patient Instructions (Signed)
Great to see you. I will call you with your a1c results.

## 2013-09-14 ENCOUNTER — Telehealth: Payer: Self-pay | Admitting: Family Medicine

## 2013-09-14 NOTE — Telephone Encounter (Signed)
Relevant patient education assigned to patient using Emmi. ° °

## 2013-09-23 DIAGNOSIS — M1710 Unilateral primary osteoarthritis, unspecified knee: Secondary | ICD-10-CM | POA: Diagnosis not present

## 2013-09-23 DIAGNOSIS — Z96659 Presence of unspecified artificial knee joint: Secondary | ICD-10-CM | POA: Diagnosis not present

## 2013-10-11 ENCOUNTER — Other Ambulatory Visit: Payer: Self-pay | Admitting: Family Medicine

## 2013-10-25 ENCOUNTER — Other Ambulatory Visit: Payer: Self-pay | Admitting: Family Medicine

## 2013-10-28 ENCOUNTER — Other Ambulatory Visit: Payer: Self-pay | Admitting: Family Medicine

## 2013-11-06 IMAGING — CR DG CHEST 2V
2 series · 2 of 2 positions shown · non-contrast
Comparison: None.

CLINICAL DATA: Preop right total knee arthroplasty

CHEST - 2 VIEW

[view not recorded (1 of 2)]
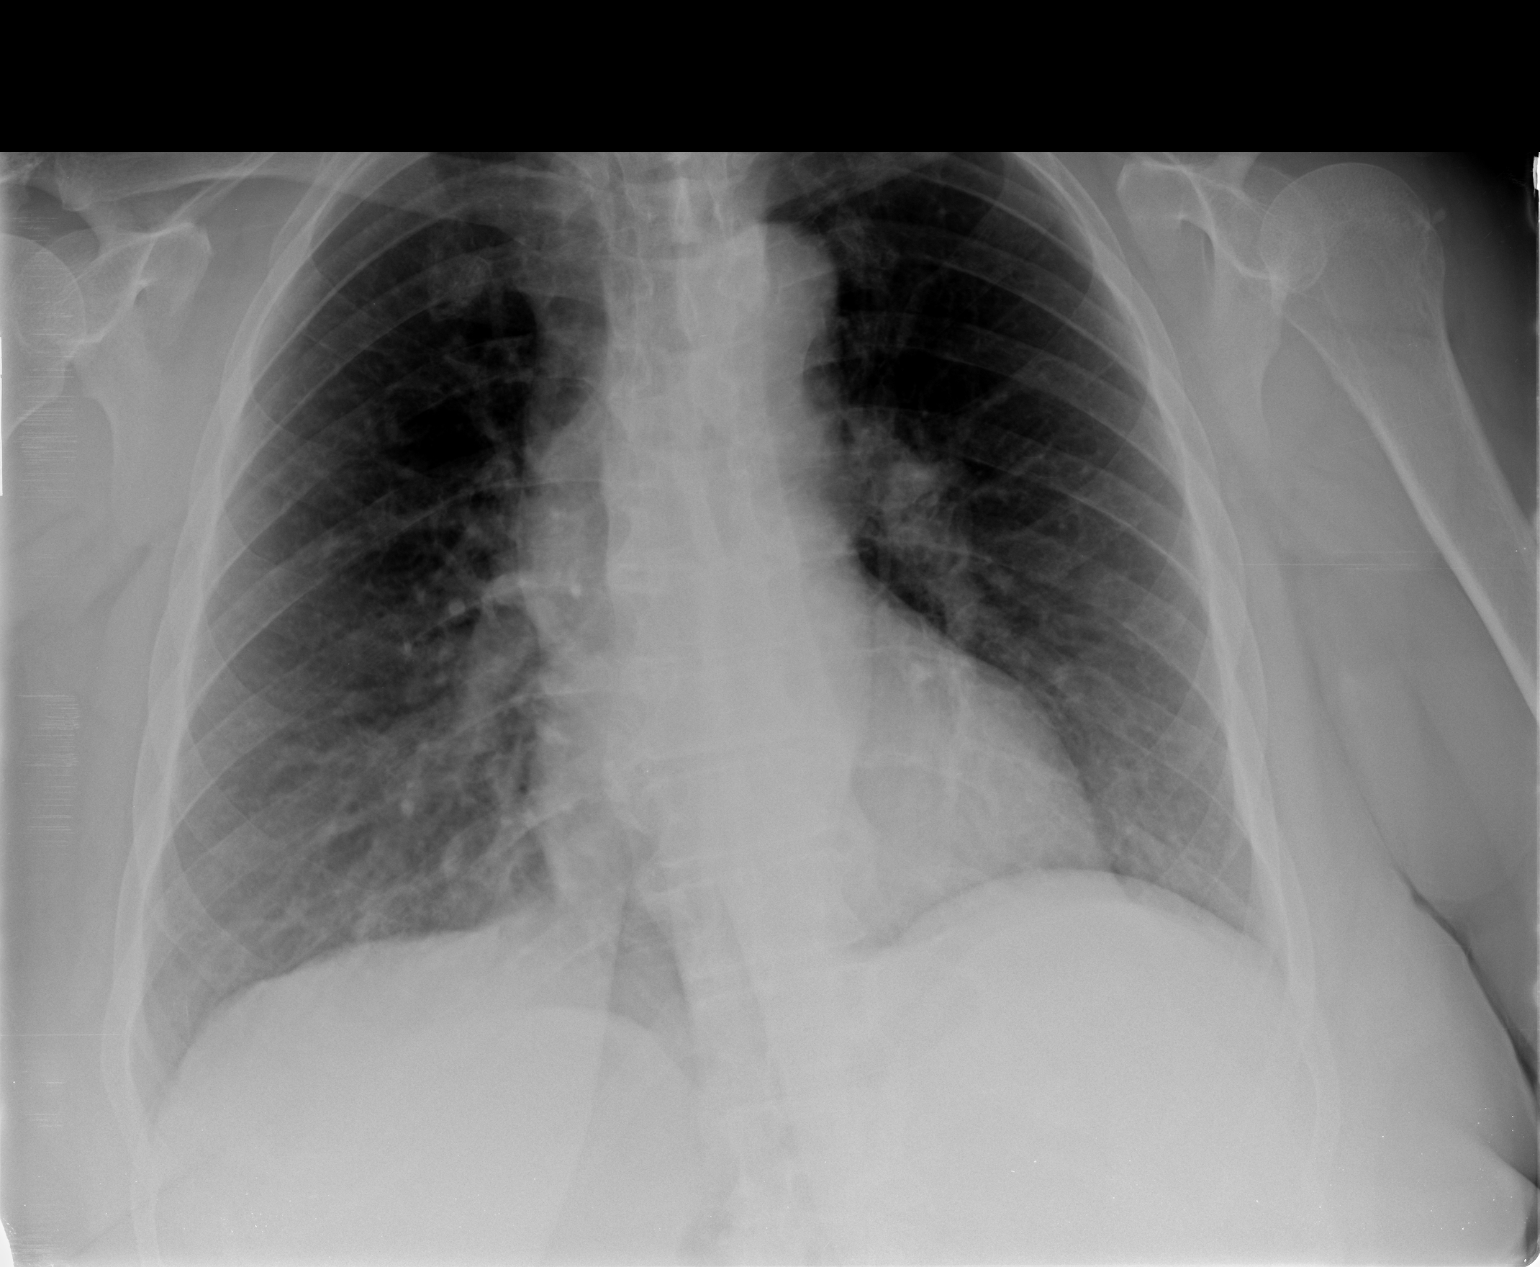

[view not recorded (2 of 2)]
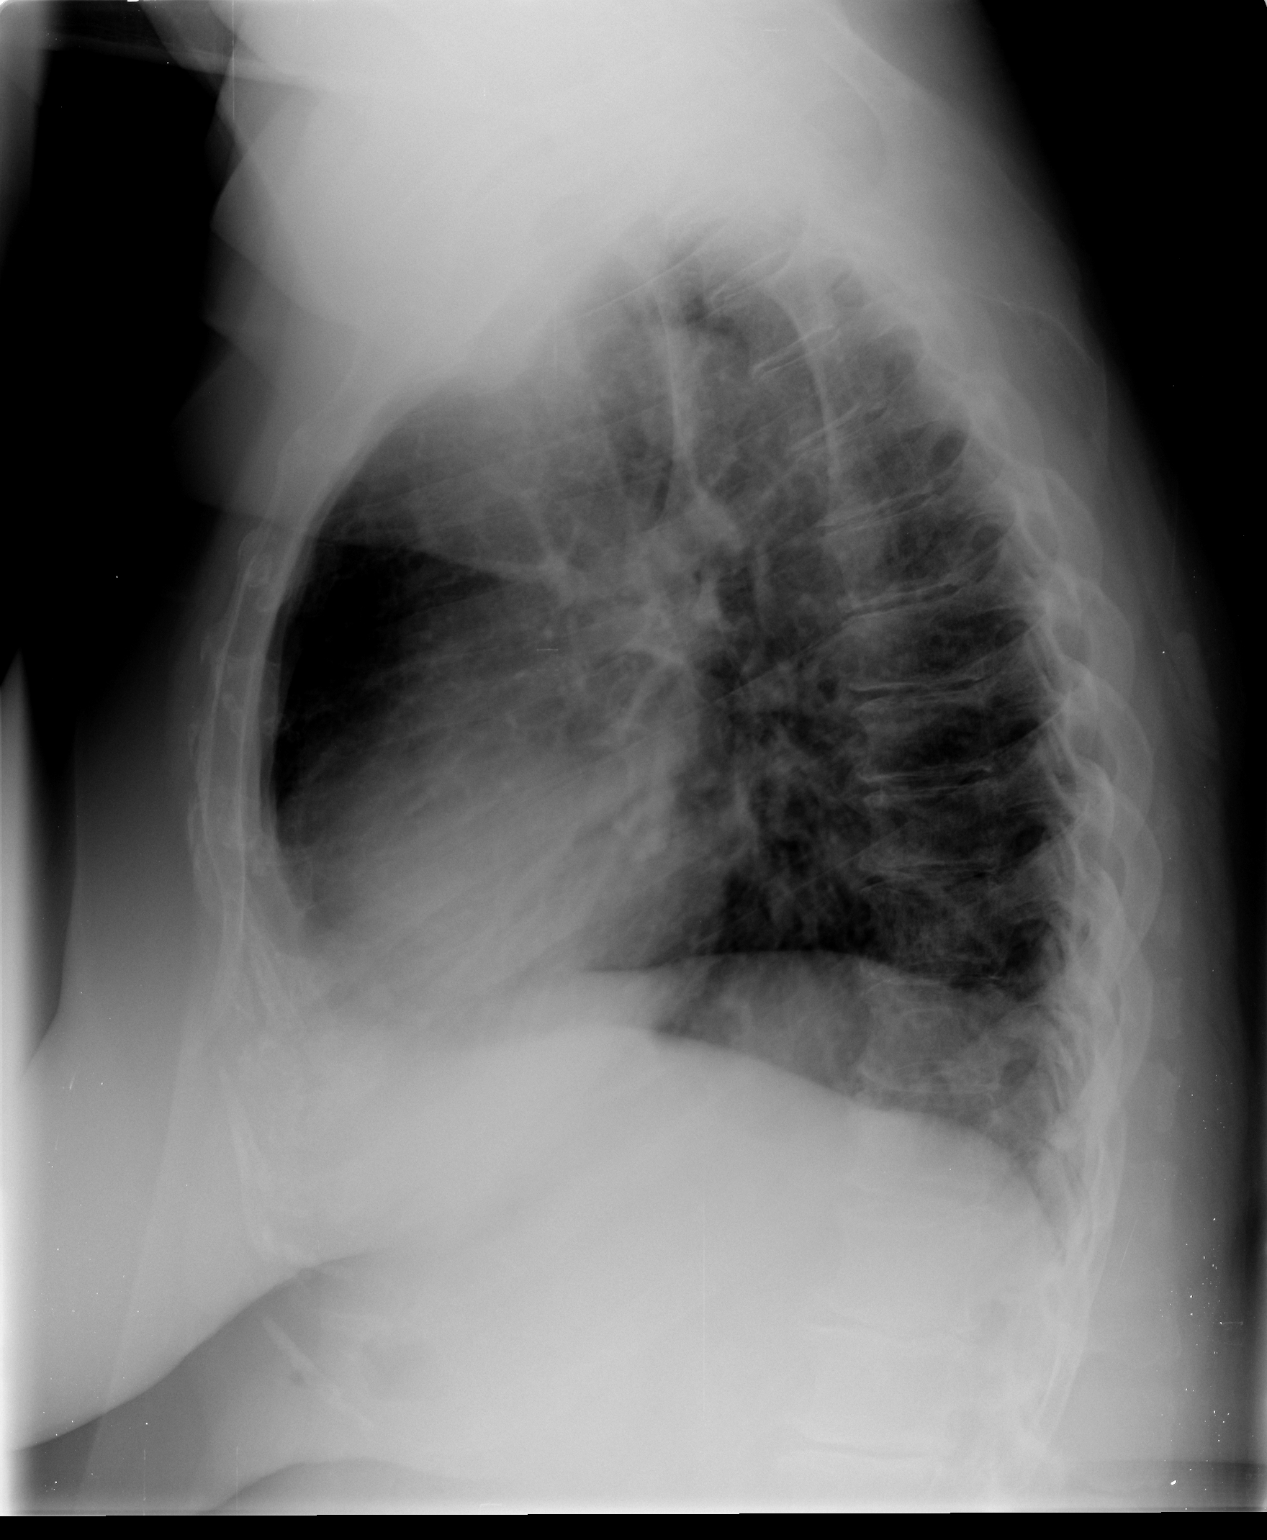

[2 of 2 positions shown; findings below may reference images not displayed]

FINDINGS: Lungs are clear. No pleural effusion or pneumothorax.

Cardiomediastinal silhouette is within normal limits.

Degenerative changes of the visualized thoracolumbar spine.
IMPRESSION: No evidence of acute cardiopulmonary disease.

## 2013-11-23 DIAGNOSIS — H524 Presbyopia: Secondary | ICD-10-CM | POA: Diagnosis not present

## 2013-11-23 DIAGNOSIS — H35039 Hypertensive retinopathy, unspecified eye: Secondary | ICD-10-CM | POA: Diagnosis not present

## 2013-12-16 ENCOUNTER — Other Ambulatory Visit: Payer: Self-pay

## 2013-12-16 DIAGNOSIS — Z1231 Encounter for screening mammogram for malignant neoplasm of breast: Secondary | ICD-10-CM

## 2013-12-22 ENCOUNTER — Ambulatory Visit (INDEPENDENT_AMBULATORY_CARE_PROVIDER_SITE_OTHER): Payer: Medicare Other

## 2013-12-22 DIAGNOSIS — Z23 Encounter for immunization: Secondary | ICD-10-CM

## 2014-01-24 ENCOUNTER — Ambulatory Visit
Admission: RE | Admit: 2014-01-24 | Discharge: 2014-01-24 | Disposition: A | Discharge status: Home / Self Care | Payer: Medicare Other | Source: Ambulatory Visit

## 2014-01-24 DIAGNOSIS — Z1231 Encounter for screening mammogram for malignant neoplasm of breast: Secondary | ICD-10-CM | POA: Diagnosis not present

## 2014-03-14 ENCOUNTER — Ambulatory Visit: Payer: Medicare Other | Admitting: Family Medicine

## 2014-04-04 ENCOUNTER — Ambulatory Visit (INDEPENDENT_AMBULATORY_CARE_PROVIDER_SITE_OTHER): Payer: Medicare Other | Admitting: Family Medicine

## 2014-04-04 ENCOUNTER — Encounter: Payer: Self-pay | Admitting: Family Medicine

## 2014-04-04 VITALS — BP 124/76 | HR 71 | Temp 97.6°F | Wt 325.5 lb

## 2014-04-04 DIAGNOSIS — I10 Essential (primary) hypertension: Secondary | ICD-10-CM

## 2014-04-04 MED ORDER — HYDROCHLOROTHIAZIDE 25 MG PO TABS: ORAL_TABLET | ORAL | Status: DC

## 2014-04-04 NOTE — Progress Notes (Signed)
69 yo here follow up.   HTN- Has been controlled on Altace 5 mg adn HCTZ 25 mg.     No CP, SOB, LE edema.  Lab Results  Component Value Date   CREATININE 1.0 09/06/2013   Lab Results  Component Value Date   CHOL 209* 09/06/2013   HDL 46.20 09/06/2013   LDLCALC 121* 09/06/2013   LDLDIRECT 151.3 03/15/2013   TRIG 207.0* 09/06/2013   CHOLHDL 5 09/06/2013     Morbid obesity-unfortunately has gained more weight.  Admits to not eating well during the holidays.  Wt Readings from Last 3 Encounters:  04/04/14 325 lb 8 oz (147.646 kg)  09/13/13 302 lb 4 oz (137.1 kg)  08/27/13 315 lb (142.883 kg)     Patient Active Problem List   Diagnosis Date Noted  . Medicare annual wellness visit, subsequent 09/13/2013  . Family history of malignant neoplasm of gastrointestinal tract 08/27/2013  . KNEE PAIN, BILATERAL 05/01/2009  . PURE HYPERCHOLESTEROLEMIA 02/08/2008  . HYPERGLYCEMIA 12/19/2006  . OBESITY, MORBID 07/10/2006  . HYPERTENSION 09/06/2005   Past Medical History  Diagnosis Date  . Hypertension   . PONV (postoperative nausea and vomiting)   . Arthritis     "both knees" (07/07/2012)   Past Surgical History  Procedure Laterality Date  . Vaginal delivery      X3  . Total knee arthroplasty Right 07/07/2012  . Joint replacement    . Total knee arthroplasty Right 07/07/2012    Procedure: TOTAL KNEE ARTHROPLASTY;  Surgeon: Meredith Pel, MD;  Location: [facility];  Service: Orthopedics;  Laterality: Right;  Right Total Knee Arthroplasty  . Total knee arthroplasty Left 10/13/2012    Procedure: TOTAL KNEE ARTHROPLASTY;  Surgeon: Meredith Pel, MD;  Location: [facility];  Service: Orthopedics;  Laterality: Left;  . Colonoscopy N/A 08/27/2013    Procedure: COLONOSCOPY;  Surgeon: [name], MD;  Location: [facility];  Service: Endoscopy;  Laterality: N/A;   History  Substance Use Topics  . Smoking status: Never Smoker   . Smokeless tobacco: Never Used  . Alcohol Use: No    Family History  Problem Relation Age of Onset  . Colon cancer Mother   . Colon cancer Father    No Known Allergies Current Outpatient Prescriptions on File Prior to Visit  Medication Sig Dispense Refill  . Calcium Carbonate-Vitamin D (CALCIUM 600+D) 600-400 MG-UNIT per tablet Take 1 tablet by mouth daily.      Marland Kitchen MOVIPREP 100 G SOLR Take 1 kit (200 g total) by mouth once. 1 kit 0  . Omega-3 Fatty Acids (FISH OIL PO) Take by mouth.    . ramipril (ALTACE) 5 MG capsule Take 1 capsule (5 mg total) by mouth daily. 90 capsule 0   No current facility-administered medications on file prior to visit.   The PMH, PSH, Social History, Family History, Medications, and allergies have been reviewed in [facility], and have been updated if relevant.     Review of Systems       See HPI  No CP or SOB No LE edema No HA or blurred vision No rashes No abdominal pain  Physical Exam BP 124/76 mmHg  Pulse 71  Temp(Src) 97.6 F (36.4 C) (Oral)  Wt 325 lb 8 oz (147.646 kg)  SpO2 95%  General:  alert, well-developed, well-nourished, and well-hydrated.  morbidly obese  Head:  normocephalic and atraumatic.   Lungs:  Normal respiratory effort, chest expands symmetrically. Lungs are clear to auscultation, no crackles  or wheezes. Heart:  Normal rate and regular rhythm. S1 and S2 normal without gallop, murmur, click, rub or other extra sounds. Extremities:  No clubbing, cyanosis, edema, or deformity noted with normal full range of motion of all joints.   Neurologic:  alert & oriented X3 and gait normal.   Skin:  Intact without suspicious lesions or rashes Psych:  Cognition and judgment appear intact. Alert and cooperative with normal attention span and concentration. No apparent delusions, illusions, hallucinations

## 2014-04-04 NOTE — Assessment & Plan Note (Signed)
Well controlled. Refilled- eRx today.

## 2014-04-04 NOTE — Progress Notes (Signed)
Pre visit review using our clinic review tool, if applicable. No additional management support is needed unless otherwise documented below in the visit note. 

## 2014-04-04 NOTE — Assessment & Plan Note (Signed)
Deteriorated. She has been working on increased exercise and decreased portions now that holidays are over.

## 2014-05-25 ENCOUNTER — Other Ambulatory Visit: Payer: Self-pay | Admitting: *Deleted

## 2014-05-25 MED ORDER — RAMIPRIL 5 MG PO CAPS: 5.0000 mg | ORAL_CAPSULE | Freq: Every day | ORAL | Status: DC

## 2014-09-08 ENCOUNTER — Other Ambulatory Visit (INDEPENDENT_AMBULATORY_CARE_PROVIDER_SITE_OTHER): Payer: Medicare Other

## 2014-09-08 ENCOUNTER — Other Ambulatory Visit: Payer: Self-pay | Admitting: Family Medicine

## 2014-09-08 DIAGNOSIS — I10 Essential (primary) hypertension: Secondary | ICD-10-CM

## 2014-09-08 DIAGNOSIS — E780 Pure hypercholesterolemia: Secondary | ICD-10-CM | POA: Diagnosis not present

## 2014-09-08 DIAGNOSIS — E785 Hyperlipidemia, unspecified: Secondary | ICD-10-CM

## 2014-09-08 DIAGNOSIS — Z0000 Encounter for general adult medical examination without abnormal findings: Secondary | ICD-10-CM

## 2014-09-08 LAB — CBC WITH DIFFERENTIAL/PLATELET
Basophils Absolute: 0 10*3/uL (ref 0.0–0.1)
Basophils Relative: 0.9 % (ref 0.0–3.0)
EOS PCT: 5.3 % — AB (ref 0.0–5.0)
Eosinophils Absolute: 0.3 10*3/uL (ref 0.0–0.7)
HEMATOCRIT: 42.7 % (ref 36.0–46.0)
HEMOGLOBIN: 14.3 g/dL (ref 12.0–15.0)
Lymphocytes Relative: 37.8 % (ref 12.0–46.0)
Lymphs Abs: 2 10*3/uL (ref 0.7–4.0)
MCHC: 33.4 g/dL (ref 30.0–36.0)
MCV: 82.1 fl (ref 78.0–100.0)
MONOS PCT: 10 % (ref 3.0–12.0)
Monocytes Absolute: 0.5 10*3/uL (ref 0.1–1.0)
NEUTROS ABS: 2.4 10*3/uL (ref 1.4–7.7)
Neutrophils Relative %: 46 % (ref 43.0–77.0)
PLATELETS: 222 10*3/uL (ref 150.0–400.0)
RBC: 5.2 Mil/uL — AB (ref 3.87–5.11)
RDW: 13.5 % (ref 11.5–15.5)
WBC: 5.2 10*3/uL (ref 4.0–10.5)

## 2014-09-08 LAB — COMPREHENSIVE METABOLIC PANEL
ALK PHOS: 61 U/L (ref 39–117)
ALT: 26 U/L (ref 0–35)
AST: 24 U/L (ref 0–37)
Albumin: 3.9 g/dL (ref 3.5–5.2)
BUN: 22 mg/dL (ref 6–23)
CHLORIDE: 102 meq/L (ref 96–112)
CO2: 31 mEq/L (ref 19–32)
CREATININE: 0.93 mg/dL (ref 0.40–1.20)
Calcium: 9.7 mg/dL (ref 8.4–10.5)
GFR: 63.69 mL/min (ref >60.00)
Glucose, Bld: 119 mg/dL — ABNORMAL HIGH (ref 70–99)
Potassium: 4.1 mEq/L (ref 3.5–5.1)
Sodium: 138 mEq/L (ref 135–145)
Total Bilirubin: 0.7 mg/dL (ref 0.2–1.2)
Total Protein: 6.6 g/dL (ref 6.0–8.3)

## 2014-09-08 LAB — LIPID PANEL
CHOLESTEROL: 192 mg/dL (ref 0–200)
HDL: 44.5 mg/dL (ref >39.00)
LDL Cholesterol: 113 mg/dL — ABNORMAL HIGH (ref 0–99)
NonHDL: 147.5
Total CHOL/HDL Ratio: 4
Triglycerides: 175 mg/dL — ABNORMAL HIGH (ref 0.0–149.0)
VLDL: 35 mg/dL (ref 0.0–40.0)

## 2014-09-08 LAB — TSH: TSH: 1.37 u[IU]/mL (ref 0.35–4.50)

## 2014-09-15 ENCOUNTER — Ambulatory Visit (INDEPENDENT_AMBULATORY_CARE_PROVIDER_SITE_OTHER): Payer: Medicare Other | Admitting: Family Medicine

## 2014-09-15 ENCOUNTER — Encounter: Payer: Self-pay | Admitting: Family Medicine

## 2014-09-15 VITALS — BP 134/86 | HR 70 | Temp 98.1°F | Ht 63.0 in | Wt 317.8 lb

## 2014-09-15 DIAGNOSIS — Z0000 Encounter for general adult medical examination without abnormal findings: Secondary | ICD-10-CM | POA: Diagnosis not present

## 2014-09-15 DIAGNOSIS — R7309 Other abnormal glucose: Secondary | ICD-10-CM | POA: Diagnosis not present

## 2014-09-15 DIAGNOSIS — E7800 Pure hypercholesterolemia, unspecified: Secondary | ICD-10-CM

## 2014-09-15 DIAGNOSIS — R7303 Prediabetes: Secondary | ICD-10-CM

## 2014-09-15 NOTE — Patient Instructions (Signed)
Great to see you. Please come see me in 6 months.

## 2014-09-15 NOTE — Progress Notes (Signed)
Pre visit review using our clinic review tool, if applicable. No additional management support is needed unless otherwise documented below in the visit note. 

## 2014-09-15 NOTE — Assessment & Plan Note (Signed)
The patients weight, height, BMI and visual acuity have been recorded in the chart.  Cognitive function assessed.   I have made referrals, counseling and provided education to the patient based review of the above and I have provided the pt with a written personalized care plan for preventive services.  

## 2014-09-15 NOTE — Assessment & Plan Note (Signed)
She is not willing to start rx and declining nutrition referral.  She wants to continue with diet and exercise.  Follow up labs in 6 months. The patient indicates understanding of these issues and agrees with the plan.

## 2014-09-15 NOTE — Progress Notes (Signed)
69 yo here medicare wellness visit and follow up chronic medical conditions.  I have personally reviewed the Medicare Annual Wellness questionnaire and have noted 1. The patient's medical and social history 2. Their use of alcohol, tobacco or illicit drugs 3. Their current medications and supplements 4. The patient's functional ability including ADL's, fall risks, home safety risks and hearing or visual             impairment. 5. Diet and physical activities 6. Evidence for depression or mood disorders  End of life wishes discussed and updated in Social History.  The roster of all physicians providing medical care to patient - is listed in the CareTeams section of the chart.   Well woman- G3P3, no h/o abnormal pap smears.  Last pap smear was in 08/2012. Denies any post menopausal bleeding. Colonoscopy 08/27/13- Dr. Olevia Perches- 5 year recall due to family history. Mammogram 01/24/14 Td 09/03/2005 Zoster 05/01/09 Pneumovax 07/08/12 prevnar 13 09/13/13  No personal or family h/o uterine, breast, or ovarian CA.  HTN- Has been controlled on Altace 5 mg adn HCTZ 25 mg.     No CP, SOB, LE edema.  Morbid obesity- chronic issue- she has been going to water aerobic two to three times a week.  Also stopped drinking sodas and eating potato chips.  Wt Readings from Last 3 Encounters:  09/15/14 317 lb 12 oz (144.13 kg)  04/04/14 325 lb 8 oz (147.646 kg)  09/13/13 302 lb 4 oz (137.1 kg)   Hyperglycemia- was fasting for this sample.  Denies any increased thirst or urination.  Lab Results  Component Value Date   CREATININE 0.93 09/08/2014   Lab Results  Component Value Date   HGBA1C 5.9 09/13/2013   Lab Results  Component Value Date   CHOL 192 09/08/2014   HDL 44.50 09/08/2014   LDLCALC 113* 09/08/2014   LDLDIRECT 151.3 03/15/2013   TRIG 175.0* 09/08/2014   CHOLHDL 4 09/08/2014   Lab Results  Component Value Date   TSH 1.37 09/08/2014   Lab Results  Component Value Date   WBC 5.2  09/08/2014   HGB 14.3 09/08/2014   HCT 42.7 09/08/2014   MCV 82.1 09/08/2014   PLT 222.0 09/08/2014     Patient Active Problem List   Diagnosis Date Noted  . Medicare annual wellness visit, subsequent 09/13/2013  . Family history of malignant neoplasm of gastrointestinal tract 08/27/2013  . KNEE PAIN, BILATERAL 05/01/2009  . PURE HYPERCHOLESTEROLEMIA 02/08/2008  . HYPERGLYCEMIA 12/19/2006  . OBESITY, MORBID 07/10/2006  . Essential hypertension 09/06/2005   Past Medical History  Diagnosis Date  . Hypertension   . PONV (postoperative nausea and vomiting)   . Arthritis     "both knees" (07/07/2012)   Past Surgical History  Procedure Laterality Date  . Vaginal delivery      X3  . Total knee arthroplasty Right 07/07/2012  . Joint replacement    . Total knee arthroplasty Right 07/07/2012    Procedure: TOTAL KNEE ARTHROPLASTY;  Surgeon: Meredith Pel, MD;  Location: [place];  Service: Orthopedics;  Laterality: Right;  Right Total Knee Arthroplasty  . Total knee arthroplasty Left 10/13/2012    Procedure: TOTAL KNEE ARTHROPLASTY;  Surgeon: Meredith Pel, MD;  Location: [place];  Service: Orthopedics;  Laterality: Left;  . Colonoscopy N/A 08/27/2013    Procedure: COLONOSCOPY;  Surgeon: [name], MD;  Location: [facility];  Service: Endoscopy;  Laterality: N/A;   History  Substance Use Topics  . Smoking  status: Never Smoker   . Smokeless tobacco: Never Used  . Alcohol Use: No   Family History  Problem Relation Age of Onset  . Colon cancer Mother   . Colon cancer Father    No Known Allergies Current Outpatient Prescriptions on File Prior to Visit  Medication Sig Dispense Refill  . Calcium Carbonate-Vitamin D (CALCIUM 600+D) 600-400 MG-UNIT per tablet Take 1 tablet by mouth daily.      . hydrochlorothiazide (HYDRODIURIL) 25 MG tablet TAKE 1 TABLET (25 MG TOTAL) BY MOUTH DAILY. 90 tablet 1  . Omega-3 Fatty Acids (FISH OIL PO) Take by mouth.    . ramipril (ALTACE) 5 MG  capsule Take 1 capsule (5 mg total) by mouth daily. 90 capsule 3   No current facility-administered medications on file prior to visit.   The PMH, PSH, Social History, Family History, Medications, and allergies have been reviewed in [facility], and have been updated if relevant.     Review of Systems   Review of Systems  Constitutional: Negative.   HENT: Negative.   Respiratory: Negative.   Cardiovascular: Negative.   Gastrointestinal: Negative.   Endocrine: Negative.   Genitourinary: Negative.   Musculoskeletal: Positive for arthralgias.  Skin: Negative.   Allergic/Immunologic: Negative.   Neurological: Negative.   Hematological: Negative.   Psychiatric/Behavioral: Negative.   All other systems reviewed and are negative.     Physical Exam BP 134/86 mmHg  Pulse 70  Temp(Src) 98.1 F (36.7 C) (Oral)  Ht 5\' 3"  (1.6 m)  Wt 317 lb 12 oz (144.13 kg)  BMI 56.30 kg/m2  SpO2 95%  General:  Morbidly obese, alert, well-developed, well-nourished, and well-hydrated.   Head:  normocephalic and atraumatic.   Eyes:  vision grossly intact, pupils equal, pupils round, and pupils reactive to light.   Ears:  R ear normal and L ear normal.   Nose:  no external deformity.   Mouth:  good dentition.   Neck:  No deformities, masses, or tenderness noted. Breasts:  No mass, nodules, thickening, tenderness, bulging, retraction, inflamation, nipple discharge or skin changes noted.   Lungs:  Normal respiratory effort, chest expands symmetrically. Lungs are clear to auscultation, no crackles or wheezes. Heart:  Normal rate and regular rhythm. S1 and S2 normal without gallop, murmur, click, rub or other extra sounds. Abdomen:  Bowel sounds positive,abdomen soft and non-tender without masses, organomegaly or hernias noted.oted of thoracic or lumbar spine.   Extremities:  No clubbing, cyanosis, edema, or deformity noted with normal full range of motion of all joints.   Neurologic:  alert & oriented X3 and  gait normal.   Skin:  Intact without suspicious lesions or rashes Cervical Nodes:  No lymphadenopathy noted Axillary Nodes:  No palpable lymphadenopathy Psych:  Cognition and judgment appear intact. Alert and cooperative with normal attention span and concentration. No apparent delusions, illusions, hallucinations

## 2014-09-28 ENCOUNTER — Other Ambulatory Visit: Payer: Self-pay | Admitting: Family Medicine

## 2014-12-07 ENCOUNTER — Ambulatory Visit (INDEPENDENT_AMBULATORY_CARE_PROVIDER_SITE_OTHER): Payer: Medicare Other

## 2014-12-07 DIAGNOSIS — Z23 Encounter for immunization: Secondary | ICD-10-CM | POA: Diagnosis not present

## 2014-12-23 DIAGNOSIS — H524 Presbyopia: Secondary | ICD-10-CM | POA: Diagnosis not present

## 2014-12-23 DIAGNOSIS — H25013 Cortical age-related cataract, bilateral: Secondary | ICD-10-CM | POA: Diagnosis not present

## 2014-12-23 DIAGNOSIS — H35033 Hypertensive retinopathy, bilateral: Secondary | ICD-10-CM | POA: Diagnosis not present

## 2014-12-23 DIAGNOSIS — H35372 Puckering of macula, left eye: Secondary | ICD-10-CM | POA: Diagnosis not present

## 2014-12-23 LAB — HM DIABETES EYE EXAM

## 2014-12-26 ENCOUNTER — Other Ambulatory Visit: Payer: Self-pay

## 2014-12-26 DIAGNOSIS — Z1231 Encounter for screening mammogram for malignant neoplasm of breast: Secondary | ICD-10-CM

## 2015-01-27 ENCOUNTER — Ambulatory Visit
Admission: RE | Admit: 2015-01-27 | Discharge: 2015-01-27 | Disposition: A | Discharge status: Home / Self Care | Payer: Medicare Other | Source: Ambulatory Visit

## 2015-01-27 DIAGNOSIS — Z1231 Encounter for screening mammogram for malignant neoplasm of breast: Secondary | ICD-10-CM | POA: Diagnosis not present

## 2015-03-09 ENCOUNTER — Other Ambulatory Visit: Payer: Self-pay | Admitting: Family Medicine

## 2015-03-09 DIAGNOSIS — R7303 Prediabetes: Secondary | ICD-10-CM

## 2015-03-09 DIAGNOSIS — I10 Essential (primary) hypertension: Secondary | ICD-10-CM

## 2015-03-09 DIAGNOSIS — E7800 Pure hypercholesterolemia, unspecified: Secondary | ICD-10-CM

## 2015-03-17 ENCOUNTER — Other Ambulatory Visit (INDEPENDENT_AMBULATORY_CARE_PROVIDER_SITE_OTHER): Payer: Medicare Other

## 2015-03-17 DIAGNOSIS — E7800 Pure hypercholesterolemia, unspecified: Secondary | ICD-10-CM | POA: Diagnosis not present

## 2015-03-17 DIAGNOSIS — R7303 Prediabetes: Secondary | ICD-10-CM

## 2015-03-17 LAB — COMPREHENSIVE METABOLIC PANEL
ALBUMIN: 3.7 g/dL (ref 3.5–5.2)
ALT: 24 U/L (ref 0–35)
AST: 21 U/L (ref 0–37)
Alkaline Phosphatase: 56 U/L (ref 39–117)
BUN: 26 mg/dL — AB (ref 6–23)
CALCIUM: 9.7 mg/dL (ref 8.4–10.5)
CHLORIDE: 100 meq/L (ref 96–112)
CO2: 31 mEq/L (ref 19–32)
Creatinine, Ser: 0.99 mg/dL (ref 0.40–1.20)
GFR: 59.16 mL/min — AB (ref >60.00)
GLUCOSE: 114 mg/dL — AB (ref 70–99)
Potassium: 3.4 mEq/L — ABNORMAL LOW (ref 3.5–5.1)
Sodium: 140 mEq/L (ref 135–145)
TOTAL PROTEIN: 6.7 g/dL (ref 6.0–8.3)
Total Bilirubin: 0.7 mg/dL (ref 0.2–1.2)

## 2015-03-17 LAB — HEMOGLOBIN A1C: Hgb A1c MFr Bld: 6.1 % (ref 4.6–6.5)

## 2015-03-17 LAB — LIPID PANEL
CHOLESTEROL: 194 mg/dL (ref 0–200)
HDL: 47.8 mg/dL (ref >39.00)
LDL CALC: 114 mg/dL — AB (ref 0–99)
NONHDL: 146.55
Total CHOL/HDL Ratio: 4
Triglycerides: 164 mg/dL — ABNORMAL HIGH (ref 0.0–149.0)
VLDL: 32.8 mg/dL (ref 0.0–40.0)

## 2015-05-12 DIAGNOSIS — L821 Other seborrheic keratosis: Secondary | ICD-10-CM | POA: Diagnosis not present

## 2015-05-17 ENCOUNTER — Other Ambulatory Visit: Payer: Self-pay | Admitting: Family Medicine

## 2015-09-04 ENCOUNTER — Other Ambulatory Visit: Payer: Self-pay | Admitting: Family Medicine

## 2015-09-04 DIAGNOSIS — Z0000 Encounter for general adult medical examination without abnormal findings: Secondary | ICD-10-CM

## 2015-09-04 DIAGNOSIS — I10 Essential (primary) hypertension: Secondary | ICD-10-CM

## 2015-09-04 DIAGNOSIS — E7800 Pure hypercholesterolemia, unspecified: Secondary | ICD-10-CM

## 2015-09-13 ENCOUNTER — Other Ambulatory Visit (INDEPENDENT_AMBULATORY_CARE_PROVIDER_SITE_OTHER): Payer: Medicare Other

## 2015-09-13 DIAGNOSIS — Z0000 Encounter for general adult medical examination without abnormal findings: Secondary | ICD-10-CM | POA: Diagnosis not present

## 2015-09-13 DIAGNOSIS — E785 Hyperlipidemia, unspecified: Secondary | ICD-10-CM | POA: Diagnosis not present

## 2015-09-13 DIAGNOSIS — E7800 Pure hypercholesterolemia, unspecified: Secondary | ICD-10-CM | POA: Diagnosis not present

## 2015-09-13 DIAGNOSIS — I10 Essential (primary) hypertension: Secondary | ICD-10-CM | POA: Diagnosis not present

## 2015-09-13 LAB — CBC WITH DIFFERENTIAL/PLATELET
BASOS ABS: 0.1 10*3/uL (ref 0.0–0.1)
BASOS PCT: 1.3 % (ref 0.0–3.0)
EOS ABS: 0.4 10*3/uL (ref 0.0–0.7)
Eosinophils Relative: 6.9 % — ABNORMAL HIGH (ref 0.0–5.0)
HEMATOCRIT: 43.2 % (ref 36.0–46.0)
HEMOGLOBIN: 14.5 g/dL (ref 12.0–15.0)
LYMPHS PCT: 40 % (ref 12.0–46.0)
Lymphs Abs: 2.1 10*3/uL (ref 0.7–4.0)
MCHC: 33.5 g/dL (ref 30.0–36.0)
MCV: 83.2 fl (ref 78.0–100.0)
MONO ABS: 0.5 10*3/uL (ref 0.1–1.0)
Monocytes Relative: 9.5 % (ref 3.0–12.0)
Neutro Abs: 2.2 10*3/uL (ref 1.4–7.7)
Neutrophils Relative %: 42.3 % — ABNORMAL LOW (ref 43.0–77.0)
Platelets: 213 10*3/uL (ref 150.0–400.0)
RBC: 5.19 Mil/uL — AB (ref 3.87–5.11)
RDW: 13.6 % (ref 11.5–15.5)
WBC: 5.3 10*3/uL (ref 4.0–10.5)

## 2015-09-13 LAB — LIPID PANEL
CHOL/HDL RATIO: 4
CHOLESTEROL: 198 mg/dL (ref 0–200)
HDL: 47.5 mg/dL (ref >39.00)
LDL CALC: 116 mg/dL — AB (ref 0–99)
NonHDL: 150.7
TRIGLYCERIDES: 173 mg/dL — AB (ref 0.0–149.0)
VLDL: 34.6 mg/dL (ref 0.0–40.0)

## 2015-09-13 LAB — COMPREHENSIVE METABOLIC PANEL
ALBUMIN: 3.9 g/dL (ref 3.5–5.2)
ALK PHOS: 50 U/L (ref 39–117)
ALT: 28 U/L (ref 0–35)
AST: 24 U/L (ref 0–37)
BILIRUBIN TOTAL: 0.6 mg/dL (ref 0.2–1.2)
BUN: 19 mg/dL (ref 6–23)
CHLORIDE: 100 meq/L (ref 96–112)
CO2: 33 mEq/L — ABNORMAL HIGH (ref 19–32)
CREATININE: 0.91 mg/dL (ref 0.40–1.20)
Calcium: 9.8 mg/dL (ref 8.4–10.5)
GFR: 65.11 mL/min (ref >60.00)
Glucose, Bld: 109 mg/dL — ABNORMAL HIGH (ref 70–99)
Potassium: 4.2 mEq/L (ref 3.5–5.1)
SODIUM: 139 meq/L (ref 135–145)
TOTAL PROTEIN: 6.6 g/dL (ref 6.0–8.3)

## 2015-09-13 LAB — TSH: TSH: 1.29 u[IU]/mL (ref 0.35–4.50)

## 2015-09-18 ENCOUNTER — Ambulatory Visit (INDEPENDENT_AMBULATORY_CARE_PROVIDER_SITE_OTHER): Payer: Medicare Other | Admitting: Family Medicine

## 2015-09-18 ENCOUNTER — Encounter: Payer: Self-pay | Admitting: Family Medicine

## 2015-09-18 VITALS — BP 116/72 | HR 78 | Temp 98.0°F | Ht 63.0 in | Wt 316.0 lb

## 2015-09-18 DIAGNOSIS — E7800 Pure hypercholesterolemia, unspecified: Secondary | ICD-10-CM

## 2015-09-18 DIAGNOSIS — Z0000 Encounter for general adult medical examination without abnormal findings: Secondary | ICD-10-CM

## 2015-09-18 DIAGNOSIS — I10 Essential (primary) hypertension: Secondary | ICD-10-CM | POA: Diagnosis not present

## 2015-09-18 MED ORDER — HYDROCHLOROTHIAZIDE 25 MG PO TABS: ORAL_TABLET | ORAL | Status: DC

## 2015-09-18 NOTE — Assessment & Plan Note (Signed)
Continues to work on diet, water aerobic twice a week.

## 2015-09-18 NOTE — Progress Notes (Signed)
70 yo here medicare wellness visit and follow up chronic medical conditions.  I have personally reviewed the Medicare Annual Wellness questionnaire and have noted 1. The patient's medical and social history 2. Their use of alcohol, tobacco or illicit drugs 3. Their current medications and supplements 4. The patient's functional ability including ADL's, fall risks, home safety risks and hearing or visual             impairment. 5. Diet and physical activities 6. Evidence for depression or mood disorders  End of life wishes discussed and updated in Social History.  The roster of all physicians providing medical care to patient - is listed in the CareTeams section of the chart.   Well woman- G3P3, no h/o abnormal pap smears.  Last pap smear was in 08/2012. Denies any post menopausal bleeding. Colonoscopy 08/27/13- Dr. Olevia Perches- 5 year recall due to family history. Mammogram 01/27/2015 Td 09/03/2005 Zoster 05/01/09 Pneumovax 07/08/12 prevnar 13 09/13/13  No personal or family h/o uterine, breast, or ovarian CA.  HTN- Has been controlled on Altace 5 mg adn HCTZ 25 mg.     No CP, SOB, LE edema.  Saw dermatologist on 05/12/15.  Morbid obesity- chronic issue- she has been going to water aerobic two to three times a week.  Also stopped drinking sodas and eating potato chips. Weight is stable, "at least gaining weight."  Wt Readings from Last 3 Encounters:  09/18/15 316 lb (143.337 kg)  09/15/14 317 lb 12 oz (144.13 kg)  04/04/14 325 lb 8 oz (147.646 kg)     Lab Results  Component Value Date   CREATININE 0.91 09/13/2015   Lab Results  Component Value Date   HGBA1C 6.1 03/17/2015   Lab Results  Component Value Date   CHOL 198 09/13/2015   HDL 47.50 09/13/2015   LDLCALC 116* 09/13/2015   LDLDIRECT 151.3 03/15/2013   TRIG 173.0* 09/13/2015   CHOLHDL 4 09/13/2015   Lab Results  Component Value Date   TSH 1.29 09/13/2015   Lab Results  Component Value Date   WBC 5.3 09/13/2015   HGB 14.5 09/13/2015   HCT 43.2 09/13/2015   MCV 83.2 09/13/2015   PLT 213.0 09/13/2015     Patient Active Problem List   Diagnosis Date Noted  . Medicare annual wellness visit, subsequent 09/13/2013  . Family history of malignant neoplasm of gastrointestinal tract 08/27/2013  . KNEE PAIN, BILATERAL 05/01/2009  . PURE HYPERCHOLESTEROLEMIA 02/08/2008  . Pre-diabetes 12/19/2006  . OBESITY, MORBID 07/10/2006  . Essential hypertension 09/06/2005   Past Medical History  Diagnosis Date  . Hypertension   . PONV (postoperative nausea and vomiting)   . Arthritis     "both knees" (07/07/2012)   Past Surgical History  Procedure Laterality Date  . Vaginal delivery      X3  . Total knee arthroplasty Right 07/07/2012  . Joint replacement    . Total knee arthroplasty Right 07/07/2012    Procedure: TOTAL KNEE ARTHROPLASTY;  Surgeon: Meredith Pel, MD;  Location: [place];  Service: Orthopedics;  Laterality: Right;  Right Total Knee Arthroplasty  . Total knee arthroplasty Left 10/13/2012    Procedure: TOTAL KNEE ARTHROPLASTY;  Surgeon: Meredith Pel, MD;  Location: [place];  Service: Orthopedics;  Laterality: Left;  . Colonoscopy N/A 08/27/2013    Procedure: COLONOSCOPY;  Surgeon: [name], MD;  Location: [facility];  Service: Endoscopy;  Laterality: N/A;   Social History  Substance Use Topics  . Smoking status: Never  Smoker   . Smokeless tobacco: Never Used  . Alcohol Use: No   Family History  Problem Relation Age of Onset  . Colon cancer Mother   . Colon cancer Father    No Known Allergies Current Outpatient Prescriptions on File Prior to Visit  Medication Sig Dispense Refill  . Calcium Carbonate-Vitamin D (CALCIUM 600+D) 600-400 MG-UNIT per tablet Take 1 tablet by mouth daily.      . Omega-3 Fatty Acids (FISH OIL PO) Take by mouth.    . ramipril (ALTACE) 5 MG capsule TAKE 1 CAPSULE (5 MG TOTAL) BY MOUTH DAILY. 90 capsule 1   No current facility-administered medications  on file prior to visit.   The PMH, PSH, Social History, Family History, Medications, and allergies have been reviewed in [facility], and have been updated if relevant.     Review of Systems   Review of Systems  Constitutional: Negative.   HENT: Negative.   Respiratory: Negative.   Cardiovascular: Negative.   Gastrointestinal: Negative.   Endocrine: Negative.   Genitourinary: Negative.   Musculoskeletal: Positive for arthralgias.  Skin: Negative.   Allergic/Immunologic: Negative.   Neurological: Negative.   Hematological: Negative.   Psychiatric/Behavioral: Negative.   All other systems reviewed and are negative.     Physical Exam BP 116/72 mmHg  Pulse 78  Temp(Src) 98 F (36.7 C) (Oral)  Ht 5\' 3"  (1.6 m)  Wt 316 lb (143.337 kg)  BMI 55.99 kg/m2  SpO2 93%  General:  Morbidly obese, alert, well-developed, well-nourished, and well-hydrated.   Head:  normocephalic and atraumatic.   Eyes:  vision grossly intact, pupils equal, pupils round, and pupils reactive to light.   Ears:  R ear normal and L ear normal.   Nose:  no external deformity.   Mouth:  good dentition.   Neck:  No deformities, masses, or tenderness noted. Breasts:  No mass, nodules, thickening, tenderness, bulging, retraction, inflamation, nipple discharge or skin changes noted.   Lungs:  Normal respiratory effort, chest expands symmetrically. Lungs are clear to auscultation, no crackles or wheezes. Heart:  Normal rate and regular rhythm. S1 and S2 normal without gallop, murmur, click, rub or other extra sounds. Abdomen:  Bowel sounds positive,abdomen soft and non-tender without masses, organomegaly or hernias noted.oted of thoracic or lumbar spine.   Extremities:  No clubbing, cyanosis, edema, or deformity noted with normal full range of motion of all joints.   Neurologic:  alert & oriented X3 and gait normal.   Skin:  Intact without suspicious lesions or rashes Cervical Nodes:  No lymphadenopathy noted Axillary  Nodes:  No palpable lymphadenopathy Psych:  Cognition and judgment appear intact. Alert and cooperative with normal attention span and concentration. No apparent delusions, illusions, hallucinations

## 2015-09-18 NOTE — Progress Notes (Signed)
Pre visit review using our clinic review tool, if applicable. No additional management support is needed unless otherwise documented below in the visit note. 

## 2015-09-18 NOTE — Assessment & Plan Note (Signed)
Well controlled. No changes made today. 

## 2015-09-18 NOTE — Patient Instructions (Signed)
Great to see you! Keep up the good work.

## 2015-09-18 NOTE — Assessment & Plan Note (Signed)
Improved. No changes made today.

## 2015-09-18 NOTE — Assessment & Plan Note (Signed)
The patients weight, height, BMI and visual acuity have been recorded in the chart.  Cognitive function assessed.   I have made referrals, counseling and provided education to the patient based review of the above and I have provided the pt with a written personalized care plan for preventive services.  

## 2015-09-19 ENCOUNTER — Other Ambulatory Visit: Payer: Self-pay | Admitting: Family Medicine

## 2015-11-13 ENCOUNTER — Other Ambulatory Visit: Payer: Self-pay | Admitting: Family Medicine

## 2015-12-29 ENCOUNTER — Ambulatory Visit (INDEPENDENT_AMBULATORY_CARE_PROVIDER_SITE_OTHER): Payer: Medicare Other

## 2015-12-29 DIAGNOSIS — Z23 Encounter for immunization: Secondary | ICD-10-CM

## 2016-01-25 ENCOUNTER — Other Ambulatory Visit: Payer: Self-pay | Admitting: Family Medicine

## 2016-01-25 DIAGNOSIS — Z1231 Encounter for screening mammogram for malignant neoplasm of breast: Secondary | ICD-10-CM

## 2016-02-26 ENCOUNTER — Ambulatory Visit
Admission: RE | Admit: 2016-02-26 | Discharge: 2016-02-26 | Disposition: A | Discharge status: Home / Self Care | Payer: Medicare Other | Source: Ambulatory Visit | Attending: Family Medicine | Admitting: Family Medicine

## 2016-02-26 DIAGNOSIS — Z1231 Encounter for screening mammogram for malignant neoplasm of breast: Secondary | ICD-10-CM | POA: Diagnosis not present

## 2016-07-18 DIAGNOSIS — H353131 Nonexudative age-related macular degeneration, bilateral, early dry stage: Secondary | ICD-10-CM | POA: Diagnosis not present

## 2016-07-18 DIAGNOSIS — H25013 Cortical age-related cataract, bilateral: Secondary | ICD-10-CM | POA: Diagnosis not present

## 2016-07-18 DIAGNOSIS — H2513 Age-related nuclear cataract, bilateral: Secondary | ICD-10-CM | POA: Diagnosis not present

## 2016-07-18 DIAGNOSIS — H524 Presbyopia: Secondary | ICD-10-CM | POA: Diagnosis not present

## 2016-08-18 ENCOUNTER — Other Ambulatory Visit: Payer: Self-pay | Admitting: Family Medicine

## 2016-08-29 ENCOUNTER — Other Ambulatory Visit: Payer: Self-pay | Admitting: Family Medicine

## 2016-08-29 DIAGNOSIS — Z0000 Encounter for general adult medical examination without abnormal findings: Secondary | ICD-10-CM

## 2016-09-12 ENCOUNTER — Other Ambulatory Visit (INDEPENDENT_AMBULATORY_CARE_PROVIDER_SITE_OTHER): Payer: Medicare Other

## 2016-09-12 DIAGNOSIS — Z0000 Encounter for general adult medical examination without abnormal findings: Secondary | ICD-10-CM | POA: Diagnosis not present

## 2016-09-12 LAB — CBC WITH DIFFERENTIAL/PLATELET
BASOS ABS: 0.1 10*3/uL (ref 0.0–0.1)
BASOS PCT: 1.8 % (ref 0.0–3.0)
Eosinophils Absolute: 0.3 10*3/uL (ref 0.0–0.7)
Eosinophils Relative: 6.7 % — ABNORMAL HIGH (ref 0.0–5.0)
HEMATOCRIT: 43.6 % (ref 36.0–46.0)
Hemoglobin: 14.6 g/dL (ref 12.0–15.0)
LYMPHS PCT: 42.1 % (ref 12.0–46.0)
Lymphs Abs: 2 10*3/uL (ref 0.7–4.0)
MCHC: 33.4 g/dL (ref 30.0–36.0)
MCV: 82.9 fl (ref 78.0–100.0)
MONOS PCT: 10.7 % (ref 3.0–12.0)
Monocytes Absolute: 0.5 10*3/uL (ref 0.1–1.0)
NEUTROS ABS: 1.9 10*3/uL (ref 1.4–7.7)
Neutrophils Relative %: 38.7 % — ABNORMAL LOW (ref 43.0–77.0)
PLATELETS: 205 10*3/uL (ref 150.0–400.0)
RBC: 5.25 Mil/uL — ABNORMAL HIGH (ref 3.87–5.11)
RDW: 13.8 % (ref 11.5–15.5)
WBC: 4.8 10*3/uL (ref 4.0–10.5)

## 2016-09-12 LAB — COMPREHENSIVE METABOLIC PANEL
ALBUMIN: 3.9 g/dL (ref 3.5–5.2)
ALK PHOS: 48 U/L (ref 39–117)
ALT: 23 U/L (ref 0–35)
AST: 25 U/L (ref 0–37)
BILIRUBIN TOTAL: 0.8 mg/dL (ref 0.2–1.2)
BUN: 22 mg/dL (ref 6–23)
CALCIUM: 9.7 mg/dL (ref 8.4–10.5)
CO2: 31 meq/L (ref 19–32)
Chloride: 101 mEq/L (ref 96–112)
Creatinine, Ser: 1.1 mg/dL (ref 0.40–1.20)
GFR: 52.16 mL/min — AB (ref >60.00)
Glucose, Bld: 106 mg/dL — ABNORMAL HIGH (ref 70–99)
Potassium: 3.5 mEq/L (ref 3.5–5.1)
Sodium: 140 mEq/L (ref 135–145)
Total Protein: 6.4 g/dL (ref 6.0–8.3)

## 2016-09-12 LAB — LIPID PANEL
CHOL/HDL RATIO: 4
CHOLESTEROL: 190 mg/dL (ref 0–200)
HDL: 44.8 mg/dL (ref >39.00)
LDL Cholesterol: 116 mg/dL — ABNORMAL HIGH (ref 0–99)
NonHDL: 145.41
TRIGLYCERIDES: 146 mg/dL (ref 0.0–149.0)
VLDL: 29.2 mg/dL (ref 0.0–40.0)

## 2016-09-12 LAB — TSH: TSH: 1.13 u[IU]/mL (ref 0.35–4.50)

## 2016-09-13 LAB — HEPATITIS C ANTIBODY: HCV Ab: NEGATIVE

## 2016-09-18 ENCOUNTER — Ambulatory Visit (INDEPENDENT_AMBULATORY_CARE_PROVIDER_SITE_OTHER): Payer: Medicare Other | Admitting: Family Medicine

## 2016-09-18 ENCOUNTER — Encounter: Payer: Self-pay | Admitting: Family Medicine

## 2016-09-18 VITALS — BP 120/72 | HR 90 | Ht 63.0 in | Wt 311.0 lb

## 2016-09-18 DIAGNOSIS — R7303 Prediabetes: Secondary | ICD-10-CM

## 2016-09-18 DIAGNOSIS — Z23 Encounter for immunization: Secondary | ICD-10-CM | POA: Diagnosis not present

## 2016-09-18 DIAGNOSIS — Z0000 Encounter for general adult medical examination without abnormal findings: Secondary | ICD-10-CM | POA: Diagnosis not present

## 2016-09-18 DIAGNOSIS — I10 Essential (primary) hypertension: Secondary | ICD-10-CM | POA: Diagnosis not present

## 2016-09-18 DIAGNOSIS — E7800 Pure hypercholesterolemia, unspecified: Secondary | ICD-10-CM

## 2016-09-18 NOTE — Addendum Note (Signed)
Addended by: Mady Haagensen on: 09/18/2016 08:46 AM   Modules accepted: Orders

## 2016-09-18 NOTE — Progress Notes (Signed)
71 yo here medicare wellness visit and follow up chronic medical conditions.  I have personally reviewed the Medicare Annual Wellness questionnaire and have noted 1. The patient's medical and social history 2. Their use of alcohol, tobacco or illicit drugs 3. Their current medications and supplements 4. The patient's functional ability including ADL's, fall risks, home safety risks and hearing or visual             impairment. 5. Diet and physical activities 6. Evidence for depression or mood disorders  End of life wishes discussed and updated in Social History.  The roster of all physicians providing medical care to patient - is listed in the CareTeams section of the chart.   Well woman- G3P3, no h/o abnormal pap smears.  Last pap smear was in 08/2012. Denies any post menopausal bleeding. Colonoscopy 08/27/13- Dr. Olevia Perches- 5 year recall due to family history.  Mammogram 02/26/16 Td 09/03/2005 Zoster 05/01/09 Pneumovax 07/08/12 prevnar 13 09/13/13  No personal or family h/o uterine, breast, or ovarian CA.  HTN- Has been controlled on Altace 5 mg adn HCTZ 25 mg.     No CP, SOB, LE edema.  Lab Results  Component Value Date   CREATININE 1.10 09/12/2016   Lab Results  Component Value Date   NA 140 09/12/2016   K 3.5 09/12/2016   CL 101 09/12/2016   CO2 31 09/12/2016     Morbid obesity- chronic issue- she has been going to water aerobic two to three times a week. Has lost 5 pounds since last year.  Wt Readings from Last 3 Encounters:  09/18/16 (!) 311 lb (141.1 kg)  09/18/15 (!) 316 lb (143.3 kg)  09/15/14 (!) 317 lb 12 oz (144.1 kg)     Lab Results  Component Value Date   CREATININE 1.10 09/12/2016   Lab Results  Component Value Date   HGBA1C 6.1 03/17/2015   Lab Results  Component Value Date   CHOL 190 09/12/2016   HDL 44.80 09/12/2016   LDLCALC 116 (H) 09/12/2016   LDLDIRECT 151.3 03/15/2013   TRIG 146.0 09/12/2016   CHOLHDL 4 09/12/2016   Lab Results   Component Value Date   TSH 1.13 09/12/2016   Lab Results  Component Value Date   WBC 4.8 09/12/2016   HGB 14.6 09/12/2016   HCT 43.6 09/12/2016   MCV 82.9 09/12/2016   PLT 205.0 09/12/2016     Patient Active Problem List   Diagnosis Date Noted  . Medicare annual wellness visit, subsequent 09/13/2013  . Family history of malignant neoplasm of gastrointestinal tract 08/27/2013  . KNEE PAIN, BILATERAL 05/01/2009  . PURE HYPERCHOLESTEROLEMIA 02/08/2008  . Pre-diabetes 12/19/2006  . OBESITY, MORBID 07/10/2006  . Essential hypertension 09/06/2005   Past Medical History:  Diagnosis Date  . Arthritis    "both knees" (07/07/2012)  . Hypertension   . PONV (postoperative nausea and vomiting)    Past Surgical History:  Procedure Laterality Date  . COLONOSCOPY N/A 08/27/2013   Procedure: COLONOSCOPY;  Surgeon: [name], MD;  Location: [facility];  Service: Endoscopy;  Laterality: N/A;  . JOINT REPLACEMENT    . TOTAL KNEE ARTHROPLASTY Right 07/07/2012  . TOTAL KNEE ARTHROPLASTY Right 07/07/2012   Procedure: TOTAL KNEE ARTHROPLASTY;  Surgeon: Meredith Pel, MD;  Location: [place];  Service: Orthopedics;  Laterality: Right;  Right Total Knee Arthroplasty  . TOTAL KNEE ARTHROPLASTY Left 10/13/2012   Procedure: TOTAL KNEE ARTHROPLASTY;  Surgeon: Meredith Pel, MD;  Location: [place];  Service: Orthopedics;  Laterality: Left;  Mar[facility] Kitchen VAGINAL DELIVERY     X3   Social History  Substance Use Topics  . Smoking status: Never Smoker  . Smokeless tobacco: Never Used  . Alcohol use No   Family History  Problem Relation Age of Onset  . Colon cancer Mother   . Colon cancer Father    No Known Allergies Current Outpatient Prescriptions on File Prior to Visit  Medication Sig Dispense Refill  . Calcium Carbonate-Vitamin D (CALCIUM 600+D) 600-400 MG-UNIT per tablet Take 1 tablet by mouth daily.      . hydrochlorothiazide (HYDRODIURIL) 25 MG tablet TAKE 1 TABLET (25 MG TOTAL) BY MOUTH  DAILY. 90 tablet 3  . Omega-3 Fatty Acids (FISH OIL PO) Take by mouth.    . ramipril (ALTACE) 5 MG capsule TAKE 1 CAPSULE (5 MG TOTAL) BY MOUTH DAILY. 90 capsule 0   No current facility-administered medications on file prior to visit.    The PMH, PSH, Social History, Family History, Medications, and allergies have been reviewed in [facility], and have been updated if relevant.     Review of Systems   Review of Systems  Constitutional: Negative.   HENT: Negative.   Respiratory: Negative.   Cardiovascular: Negative.   Gastrointestinal: Negative.   Endocrine: Negative.   Genitourinary: Negative.   Musculoskeletal: Positive for arthralgias.  Skin: Negative.   Allergic/Immunologic: Negative.   Neurological: Negative.   Hematological: Negative.   Psychiatric/Behavioral: Negative.   All other systems reviewed and are negative.     Physical Exam BP 120/72   Pulse 90   Ht 5\' 3"  (1.6 m)   Wt (!) 311 lb (141.1 kg)   SpO2 93%   BMI 55.09 kg/m    General:  Morbidly obese no acute distress; alert,appropriate and cooperative throughout examination Head:  normocephalic and atraumatic.   Eyes:  vision grossly intact, PERRL Ears:  R ear normal and L ear normal externally, TMs clear bilaterally Nose:  no external deformity.   Mouth:  good dentition.   Neck:  No deformities, masses, or tenderness noted. Breasts:  No mass, nodules, thickening, tenderness, bulging, retraction, inflamation, nipple discharge or skin changes noted.   Lungs:  Normal respiratory effort, chest expands symmetrically. Lungs are clear to auscultation, no crackles or wheezes. Heart:  Normal rate and regular rhythm. S1 and S2 normal without gallop, murmur, click, rub or other extra sounds. Abdomen:  Bowel sounds positive,abdomen soft and non-tender without masses, organomegaly or hernias noted. Msk:  No deformity or scoliosis noted of thoracic or lumbar spine.   Extremities:  No clubbing, cyanosis, edema, or deformity  noted with normal full range of motion of all joints.   Neurologic:  alert & oriented X3 and gait normal.   Skin:  Intact without suspicious lesions or rashes Cervical Nodes:  No lymphadenopathy noted Axillary Nodes:  No palpable lymphadenopathy Psych:  Cognition and judgment appear intact. Alert and cooperative with normal attention span and concentration. No apparent delusions, illusions, hallucinations

## 2016-09-18 NOTE — Assessment & Plan Note (Signed)
Well controlled. No changes made today. 

## 2016-09-18 NOTE — Assessment & Plan Note (Signed)
Well controlled on current rxs. Electrolytes and cr stable. No changes made to rxs today.

## 2016-09-18 NOTE — Assessment & Plan Note (Signed)
Continues to work on lifestyle changes- diet and exercise.

## 2016-09-18 NOTE — Assessment & Plan Note (Signed)
The patients weight, height, BMI and visual acuity have been recorded in the chart.  Cognitive function assessed.   I have made referrals, counseling and provided education to the patient based review of the above and I have provided the pt with a written personalized care plan for preventive services.  

## 2016-09-21 ENCOUNTER — Other Ambulatory Visit: Payer: Self-pay | Admitting: Family Medicine

## 2016-11-17 ENCOUNTER — Other Ambulatory Visit: Payer: Self-pay | Admitting: Family Medicine

## 2016-12-27 ENCOUNTER — Ambulatory Visit (INDEPENDENT_AMBULATORY_CARE_PROVIDER_SITE_OTHER): Payer: Medicare Other

## 2016-12-27 DIAGNOSIS — Z23 Encounter for immunization: Secondary | ICD-10-CM

## 2017-02-05 ENCOUNTER — Other Ambulatory Visit: Payer: Self-pay | Admitting: Family Medicine

## 2017-02-05 DIAGNOSIS — Z1231 Encounter for screening mammogram for malignant neoplasm of breast: Secondary | ICD-10-CM

## 2017-02-21 ENCOUNTER — Other Ambulatory Visit: Payer: Self-pay | Admitting: Family Medicine

## 2017-03-04 ENCOUNTER — Ambulatory Visit: Payer: Medicare Other

## 2017-03-05 ENCOUNTER — Ambulatory Visit
Admission: RE | Admit: 2017-03-05 | Discharge: 2017-03-05 | Disposition: A | Discharge status: Home / Self Care | Payer: Medicare Other | Source: Ambulatory Visit | Attending: Family Medicine | Admitting: Family Medicine

## 2017-03-05 DIAGNOSIS — Z1231 Encounter for screening mammogram for malignant neoplasm of breast: Secondary | ICD-10-CM

## 2017-08-12 ENCOUNTER — Other Ambulatory Visit: Payer: Self-pay | Admitting: Family Medicine

## 2017-08-20 DIAGNOSIS — H524 Presbyopia: Secondary | ICD-10-CM | POA: Diagnosis not present

## 2017-08-20 DIAGNOSIS — H2513 Age-related nuclear cataract, bilateral: Secondary | ICD-10-CM | POA: Diagnosis not present

## 2017-08-20 DIAGNOSIS — H25013 Cortical age-related cataract, bilateral: Secondary | ICD-10-CM | POA: Diagnosis not present

## 2017-08-20 DIAGNOSIS — H353131 Nonexudative age-related macular degeneration, bilateral, early dry stage: Secondary | ICD-10-CM | POA: Diagnosis not present

## 2017-08-20 DIAGNOSIS — H5213 Myopia, bilateral: Secondary | ICD-10-CM | POA: Diagnosis not present

## 2017-09-19 ENCOUNTER — Telehealth: Payer: Self-pay

## 2017-09-19 DIAGNOSIS — R7303 Prediabetes: Secondary | ICD-10-CM

## 2017-09-19 DIAGNOSIS — I10 Essential (primary) hypertension: Secondary | ICD-10-CM

## 2017-09-19 DIAGNOSIS — E7800 Pure hypercholesterolemia, unspecified: Secondary | ICD-10-CM

## 2017-09-19 NOTE — Telephone Encounter (Signed)
Patient has appt with Dr. Deborra Medina on 7.1.19/I called and LMOVM asking if she would be interested in coming in on 7.3.19 to see the Wellness Coach at 8am (60 minute visit) which is something that is offered as a courtesy with her insurance then see Dr. Deborra Medina the same day/pt called back within a couple minutes and agreed and thought that was what she had scheduled for/placed future orders for TSH; Lipid; CMP; CBC w/diff; A1C with Dx of HTN; HLD; Pre-DM/thx dmf

## 2017-09-22 ENCOUNTER — Encounter: Payer: Medicare Other | Admitting: Family Medicine

## 2017-09-22 NOTE — Progress Notes (Signed)
Subjective:   [name] is a 72 y.o. female who presents for Medicare Annual (Subsequent) preventive examination.  Review of Systems: No ROS.  Medicare Wellness Visit. Additional risk factors are reflected in the social history. Cardiac Risk Factors include: advanced age (>46men, >36 women);obesity (BMI >30kg/m2);hypertension Sleep patterns: Pt reports sleeping well 8 hrs per night.  Home Safety/Smoke Alarms: Feels safe in home. Smoke alarms in place.  Living environment; residence and Firearm Safety: 1 story home with husband.   Female:       Mammo-  utd     Dexa scan-  declines      CCS-due 2025 Eye- utd per pt. [place]    Objective:     Vitals: BP 132/72 (BP Location: Left Arm, Patient Position: Sitting, Cuff Size: Large)   Pulse 70   Ht 5\' 3"  (1.6 m)   Wt (!) 315 lb 3.2 oz (143 kg)   SpO2 96%   BMI 55.84 kg/m   Body mass index is 55.84 kg/m.  Advanced Directives 09/24/2017 08/27/2013 08/18/2013 10/13/2012 10/13/2012 10/05/2012 07/07/2012  Does Patient Have a Medical Advance Directive? No Patient does not have advance directive Patient does not have advance directive Patient does not have advance directive;Patient would like information - Patient does not have advance directive;Patient would like information Patient does not have advance directive;Patient would like information  Would patient like information on creating a medical advance directive? No - Patient declined - - Advance directive packet given - - Advance directive packet given  Pre-existing out of facility DNR order (yellow form or pink MOST form) - No No No No No -    Tobacco Social History   Tobacco Use  Smoking Status Never Smoker  Smokeless Tobacco Never Used     Counseling given: Not Answered   Clinical Intake: Pain : No/denies pain     Past Medical History:  Diagnosis Date  . Arthritis    "both knees" (07/07/2012)  . Hypertension   . PONV (postoperative nausea and vomiting)     Past Surgical History:  Procedure Laterality Date  . COLONOSCOPY N/A 08/27/2013   Procedure: COLONOSCOPY;  Surgeon: [name], MD;  Location: [facility];  Service: Endoscopy;  Laterality: N/A;  . JOINT REPLACEMENT    . TOTAL KNEE ARTHROPLASTY Right 07/07/2012  . TOTAL KNEE ARTHROPLASTY Right 07/07/2012   Procedure: TOTAL KNEE ARTHROPLASTY;  Surgeon: Meredith Pel, MD;  Location: [facility];  Service: Orthopedics;  Laterality: Right;  Right Total Knee Arthroplasty  . TOTAL KNEE ARTHROPLASTY Left 10/13/2012   Procedure: TOTAL KNEE ARTHROPLASTY;  Surgeon: Meredith Pel, MD;  Location: [facility];  Service: Orthopedics;  Laterality: Left;  Marland Kitchen VAGINAL DELIVERY     X3   Family History  Problem Relation Age of Onset  . Colon cancer Mother   . Colon cancer Father    Social History   Socioeconomic History  . Marital status: Married    Spouse name: Not on file  . Number of children: Not on file  . Years of education: Not on file  . Highest education level: Not on file  Occupational History  . Not on file  Social Needs  . Financial resource strain: Not on file  . Food insecurity:    Worry: Not on file    Inability: Not on file  . Transportation needs:    Medical: Not on file    Non-medical: Not on file  Tobacco Use  . Smoking status: Never Smoker  .  Smokeless tobacco: Never Used  Substance and Sexual Activity  . Alcohol use: No  . Drug use: No  . Sexual activity: Not Currently  Lifestyle  . Physical activity:    Days per week: Not on file    Minutes per session: Not on file  . Stress: Not on file  Relationships  . Social connections:    Talks on phone: Not on file    Gets together: Not on file    Attends religious service: Not on file    Active member of club or organization: Not on file    Attends meetings of clubs or organizations: Not on file    Relationship status: Not on file  Other Topics Concern  . Not on file  Social History Narrative   Desires CPR.    Would want life support but not for prolonged period of time.   Does not want feeding tubes.   Has discussed this with her husband, [name].    Outpatient Encounter Medications as of 09/24/2017  Medication Sig  . Calcium Carbonate-Vitamin D (CALCIUM 600+D) 600-400 MG-UNIT per tablet Take 1 tablet by mouth daily.    . hydrochlorothiazide (HYDRODIURIL) 25 MG tablet TAKE 1 TABLET (25 MG TOTAL) BY MOUTH DAILY.  Marland Kitchen Omega-3 Fatty Acids (FISH OIL PO) Take by mouth.  . ramipril (ALTACE) 5 MG capsule TAKE 1 CAPSULE BY MOUTH EVERY DAY   No facility-administered encounter medications on file as of 09/24/2017.     Activities of Daily Living In your present state of health, do you have any difficulty performing the following activities: 09/24/2017  Hearing? N  Vision? N  Difficulty concentrating or making decisions? N  Walking or climbing stairs? N  Dressing or bathing? N  Doing errands, shopping? N  Preparing Food and eating ? N  Using the Toilet? N  In the past six months, have you accidently leaked urine? N  Do you have problems with loss of bowel control? N  Managing your Medications? N  Managing your Finances? N  Housekeeping or managing your Housekeeping? N  Some recent data might be hidden    Patient Care Team: Lucille Passy, MD as PCP - General (Family Medicine) [name], MD (Inactive) as Consulting Physician (Gastroenterology) Marlou Sa Tonna Corner, MD as Consulting Physician (Orthopedic Surgery)    Assessment:   This is a routine wellness examination for [place]. Physical assessment deferred to PCP.  Exercise Activities and Dietary recommendations Current Exercise Habits: Structured exercise class, Time (Minutes): 60, Frequency (Times/Week): 2, Weekly Exercise (Minutes/Week): 120 Diet (meal preparation, eat out, water intake, caffeinated beverages, dairy products, fruits and vegetables): well balanced Breakfast: cereal, coffee, fruit Lunch: Tomato sandwich, water. Dinner:   Vegetables and some meat. water  Goals    . Weight (lb) < 300 lb (136.1 kg)     By decreasing snacking and adding an extra day of swimming.       Fall Risk Fall Risk  09/24/2017 09/18/2016 09/18/2015 09/15/2014 09/13/2013  Falls in the past year? No No No No No     Depression Screen PHQ 2/9 Scores 09/24/2017 09/18/2016 09/18/2015 09/15/2014  PHQ - 2 Score 0 0 0 0     Cognitive Function MMSE - Mini Mental State Exam 09/24/2017  Orientation to time 5  Orientation to Place 5  Registration 3  Attention/ Calculation 5  Recall 3  Language- name 2 objects 2  Language- repeat 1  Language- follow 3 step command 3  Language- read & follow direction  1  Write a sentence 1  Copy design 1  Total score 30        Immunization History  Administered Date(s) Administered  . Influenza Split 12/19/2010, 12/18/2011  . Influenza Whole 01/02/2007, 12/23/2007, 12/21/2008, 12/13/2009  . Influenza,inj,Quad PF,6+ Mos 12/15/2012, 12/22/2013, 12/07/2014, 12/29/2015, 12/27/2016  . Pneumococcal Conjugate-13 09/13/2013  . Pneumococcal Polysaccharide-23 07/08/2012  . Td 09/03/2005  . Tdap 09/18/2016  . Zoster 05/01/2009   Screening Tests Health Maintenance  Topic Date Due  . DEXA SCAN  07/05/2011  . INFLUENZA VACCINE  10/23/2017  . COLONOSCOPY  08/28/2018  . MAMMOGRAM  03/06/2019  . TETANUS/TDAP  09/19/2026  . Hepatitis C Screening  Completed  . PNA vac Low Risk Adult  Completed      Plan:   Follow up w/ PCP today as scheduled  Please schedule your next medicare wellness visit with me in 1 yr.  Continue to eat heart healthy diet (full of fruits, vegetables, whole grains, lean protein, water--limit salt, fat, and sugar intake) and increase physical activity as tolerated.  Continue doing brain stimulating activities (puzzles, reading, adult coloring books, staying active) to keep memory sharp.   Bring a copy of your living will and/or healthcare power of attorney to your next office visit when  your lawyer completes them.   I have personally reviewed and noted the following in the patient's chart:   . Medical and social history . Use of alcohol, tobacco or illicit drugs  . Current medications and supplements . Functional ability and status . Nutritional status . Physical activity . Advanced directives . List of other physicians . Hospitalizations, surgeries, and ER visits in previous 12 months . Vitals . Screenings to include cognitive, depression, and falls . Referrals and appointments  In addition, I have reviewed and discussed with patient certain preventive protocols, quality metrics, and best practice recommendations. A written personalized care plan for preventive services as well as general preventive health recommendations were provided to patient.     Shela Nevin, [place]  09/24/2017

## 2017-09-24 ENCOUNTER — Encounter: Payer: Self-pay | Admitting: Behavioral Health

## 2017-09-24 ENCOUNTER — Encounter: Payer: Self-pay | Admitting: Family Medicine

## 2017-09-24 ENCOUNTER — Other Ambulatory Visit: Payer: Self-pay

## 2017-09-24 ENCOUNTER — Other Ambulatory Visit: Payer: Self-pay | Admitting: Family Medicine

## 2017-09-24 ENCOUNTER — Ambulatory Visit (INDEPENDENT_AMBULATORY_CARE_PROVIDER_SITE_OTHER): Payer: Medicare Other | Admitting: Behavioral Health

## 2017-09-24 ENCOUNTER — Ambulatory Visit (INDEPENDENT_AMBULATORY_CARE_PROVIDER_SITE_OTHER): Payer: Medicare Other | Admitting: Family Medicine

## 2017-09-24 VITALS — BP 132/72 | HR 70 | Ht 63.0 in | Wt 315.2 lb

## 2017-09-24 VITALS — BP 132/72 | HR 70 | Temp 98.0°F | Ht 63.0 in | Wt 315.2 lb

## 2017-09-24 DIAGNOSIS — E7800 Pure hypercholesterolemia, unspecified: Secondary | ICD-10-CM | POA: Diagnosis not present

## 2017-09-24 DIAGNOSIS — R7303 Prediabetes: Secondary | ICD-10-CM

## 2017-09-24 DIAGNOSIS — I10 Essential (primary) hypertension: Secondary | ICD-10-CM

## 2017-09-24 DIAGNOSIS — Z0000 Encounter for general adult medical examination without abnormal findings: Secondary | ICD-10-CM | POA: Diagnosis not present

## 2017-09-24 LAB — LIPID PANEL
CHOL/HDL RATIO: 4
Cholesterol: 186 mg/dL (ref 0–200)
HDL: 46.1 mg/dL (ref >39.00)
LDL CALC: 107 mg/dL — AB (ref 0–99)
NONHDL: 140.2
Triglycerides: 165 mg/dL — ABNORMAL HIGH (ref 0.0–149.0)
VLDL: 33 mg/dL (ref 0.0–40.0)

## 2017-09-24 LAB — CBC WITH DIFFERENTIAL/PLATELET
Basophils Absolute: 0.1 10*3/uL (ref 0.0–0.1)
Basophils Relative: 1.5 % (ref 0.0–3.0)
EOS PCT: 5.3 % — AB (ref 0.0–5.0)
Eosinophils Absolute: 0.2 10*3/uL (ref 0.0–0.7)
HCT: 42.2 % (ref 36.0–46.0)
Hemoglobin: 14.2 g/dL (ref 12.0–15.0)
LYMPHS ABS: 1.5 10*3/uL (ref 0.7–4.0)
Lymphocytes Relative: 39 % (ref 12.0–46.0)
MCHC: 33.8 g/dL (ref 30.0–36.0)
MCV: 83 fl (ref 78.0–100.0)
MONO ABS: 0.4 10*3/uL (ref 0.1–1.0)
Monocytes Relative: 9.7 % (ref 3.0–12.0)
NEUTROS PCT: 44.5 % (ref 43.0–77.0)
Neutro Abs: 1.7 10*3/uL (ref 1.4–7.7)
Platelets: 188 10*3/uL (ref 150.0–400.0)
RBC: 5.08 Mil/uL (ref 3.87–5.11)
RDW: 13.6 % (ref 11.5–15.5)
WBC: 3.9 10*3/uL — ABNORMAL LOW (ref 4.0–10.5)

## 2017-09-24 LAB — COMPREHENSIVE METABOLIC PANEL
ALT: 19 U/L (ref 0–35)
AST: 20 U/L (ref 0–37)
Albumin: 4 g/dL (ref 3.5–5.2)
Alkaline Phosphatase: 51 U/L (ref 39–117)
BUN: 22 mg/dL (ref 6–23)
CHLORIDE: 98 meq/L (ref 96–112)
CO2: 33 mEq/L — ABNORMAL HIGH (ref 19–32)
Calcium: 9.6 mg/dL (ref 8.4–10.5)
Creatinine, Ser: 0.98 mg/dL (ref 0.40–1.20)
GFR: 59.42 mL/min — ABNORMAL LOW (ref >60.00)
GLUCOSE: 121 mg/dL — AB (ref 70–99)
POTASSIUM: 3.8 meq/L (ref 3.5–5.1)
Sodium: 139 mEq/L (ref 135–145)
Total Bilirubin: 0.8 mg/dL (ref 0.2–1.2)
Total Protein: 6.8 g/dL (ref 6.0–8.3)

## 2017-09-24 LAB — TSH: TSH: 0.89 u[IU]/mL (ref 0.35–4.50)

## 2017-09-24 LAB — HEMOGLOBIN A1C: HEMOGLOBIN A1C: 6 % (ref 4.6–6.5)

## 2017-09-24 MED ORDER — RAMIPRIL 5 MG PO CAPS: ORAL_CAPSULE | ORAL | 3 refills | Status: DC

## 2017-09-24 MED ORDER — HYDROCHLOROTHIAZIDE 25 MG PO TABS: ORAL_TABLET | ORAL | 3 refills | Status: DC

## 2017-09-24 NOTE — Patient Instructions (Signed)
Follow up w/ PCP today as scheduled  Please schedule your next medicare wellness visit with me in 1 yr.  Continue to eat heart healthy diet (full of fruits, vegetables, whole grains, lean protein, water--limit salt, fat, and sugar intake) and increase physical activity as tolerated.  Continue doing brain stimulating activities (puzzles, reading, adult coloring books, staying active) to keep memory sharp.   Bring a copy of your living will and/or healthcare power of attorney to your next office visit when your lawyer completes them.   [name] , Thank you for taking time to come for your Medicare Wellness Visit. I appreciate your ongoing commitment to your health goals. Please review the following plan we discussed and let me know if I can assist you in the future.   These are the goals we discussed: Goals    . Weight (lb) < 300 lb (136.1 kg)     By decreasing snacking and adding an extra day of swimming.       This is a list of the screening recommended for you and due dates:  Health Maintenance  Topic Date Due  . DEXA scan (bone density measurement)  09/25/2018*  . Flu Shot  10/23/2017  . Colon Cancer Screening  08/28/2018  . Mammogram  03/06/2019  . Tetanus Vaccine  09/19/2026  .  Hepatitis C: One time screening is recommended by Center for Disease Control  (CDC) for  adults born from [birth year] through 1964.   Completed  . Pneumonia vaccines  Completed  *Topic was postponed. The date shown is not the original due date.    Health Maintenance for Postmenopausal Women Menopause is a normal process in which your reproductive ability comes to an end. This process happens gradually over a span of months to years, usually between the ages of 51 and 58. Menopause is complete when you have missed 12 consecutive menstrual periods. It is important to talk with your health care provider about some of the most common conditions that affect postmenopausal women, such as heart disease, cancer,  and bone loss (osteoporosis). Adopting a healthy lifestyle and getting preventive care can help to promote your health and wellness. Those actions can also lower your chances of developing some of these common conditions. What should I know about menopause? During menopause, you may experience a number of symptoms, such as:  Moderate-to-severe hot flashes.  Night sweats.  Decrease in sex drive.  Mood swings.  Headaches.  Tiredness.  Irritability.  Memory problems.  Insomnia.  Choosing to treat or not to treat menopausal changes is an individual decision that you make with your health care provider. What should I know about hormone replacement therapy and supplements? Hormone therapy products are effective for treating symptoms that are associated with menopause, such as hot flashes and night sweats. Hormone replacement carries certain risks, especially as you become older. If you are thinking about using estrogen or estrogen with progestin treatments, discuss the benefits and risks with your health care provider. What should I know about heart disease and stroke? Heart disease, heart attack, and stroke become more likely as you age. This may be due, in part, to the hormonal changes that your body experiences during menopause. These can affect how your body processes dietary fats, triglycerides, and cholesterol. Heart attack and stroke are both medical emergencies. There are many things that you can do to help prevent heart disease and stroke:  Have your blood pressure checked at least every 1-2 years. High blood pressure causes  heart disease and increases the risk of stroke.  If you are 38-76 years old, ask your health care provider if you should take aspirin to prevent a heart attack or a stroke.  Do not use any tobacco products, including cigarettes, chewing tobacco, or electronic cigarettes. If you need help quitting, ask your health care provider.  It is important to eat a  healthy diet and maintain a healthy weight. ? Be sure to include plenty of vegetables, fruits, low-fat dairy products, and lean protein. ? Avoid eating foods that are high in solid fats, added sugars, or salt (sodium).  Get regular exercise. This is one of the most important things that you can do for your health. ? Try to exercise for at least 150 minutes each week. The type of exercise that you do should increase your heart rate and make you sweat. This is known as moderate-intensity exercise. ? Try to do strengthening exercises at least twice each week. Do these in addition to the moderate-intensity exercise.  Know your numbers.Ask your health care provider to check your cholesterol and your blood glucose. Continue to have your blood tested as directed by your health care provider.  What should I know about cancer screening? There are several types of cancer. Take the following steps to reduce your risk and to catch any cancer development as early as possible. Breast Cancer  Practice breast self-awareness. ? This means understanding how your breasts normally appear and feel. ? It also means doing regular breast self-exams. Let your health care provider know about any changes, no matter how small.  If you are 69 or older, have a clinician do a breast exam (clinical breast exam or CBE) every year. Depending on your age, family history, and medical history, it may be recommended that you also have a yearly breast X-ray (mammogram).  If you have a family history of breast cancer, talk with your health care provider about genetic screening.  If you are at high risk for breast cancer, talk with your health care provider about having an MRI and a mammogram every year.  Breast cancer (BRCA) gene test is recommended for women who have family members with BRCA-related cancers. Results of the assessment will determine the need for genetic counseling and BRCA1 and for BRCA2 testing. BRCA-related  cancers include these types: ? Breast. This occurs in males or females. ? Ovarian. ? Tubal. This may also be called fallopian tube cancer. ? Cancer of the abdominal or pelvic lining (peritoneal cancer). ? Prostate. ? Pancreatic.  Cervical, Uterine, and Ovarian Cancer Your health care provider may recommend that you be screened regularly for cancer of the pelvic organs. These include your ovaries, uterus, and vagina. This screening involves a pelvic exam, which includes checking for microscopic changes to the surface of your cervix (Pap test).  For women ages 21-65, health care providers may recommend a pelvic exam and a Pap test every three years. For women ages 58-65, they may recommend the Pap test and pelvic exam, combined with testing for human papilloma virus (HPV), every five years. Some types of HPV increase your risk of cervical cancer. Testing for HPV may also be done on women of any age who have unclear Pap test results.  Other health care providers may not recommend any screening for nonpregnant women who are considered low risk for pelvic cancer and have no symptoms. Ask your health care provider if a screening pelvic exam is right for you.  If you have had past  treatment for cervical cancer or a condition that could lead to cancer, you need Pap tests and screening for cancer for at least 20 years after your treatment. If Pap tests have been discontinued for you, your risk factors (such as having a new sexual partner) need to be reassessed to determine if you should start having screenings again. Some women have medical problems that increase the chance of getting cervical cancer. In these cases, your health care provider may recommend that you have screening and Pap tests more often.  If you have a family history of uterine cancer or ovarian cancer, talk with your health care provider about genetic screening.  If you have vaginal bleeding after reaching menopause, tell your health  care provider.  There are currently no reliable tests available to screen for ovarian cancer.  Lung Cancer Lung cancer screening is recommended for adults 75-80 years old who are at high risk for lung cancer because of a history of smoking. A yearly low-dose CT scan of the lungs is recommended if you:  Currently smoke.  Have a history of at least 30 pack-years of smoking and you currently smoke or have quit within the past 15 years. A pack-year is smoking an average of one pack of cigarettes per day for one year.  Yearly screening should:  Continue until it has been 15 years since you quit.  Stop if you develop a health problem that would prevent you from having lung cancer treatment.  Colorectal Cancer  This type of cancer can be detected and can often be prevented.  Routine colorectal cancer screening usually begins at age 29 and continues through age 46.  If you have risk factors for colon cancer, your health care provider may recommend that you be screened at an earlier age.  If you have a family history of colorectal cancer, talk with your health care provider about genetic screening.  Your health care provider may also recommend using home test kits to check for hidden blood in your stool.  A small camera at the end of a tube can be used to examine your colon directly (sigmoidoscopy or colonoscopy). This is done to check for the earliest forms of colorectal cancer.  Direct examination of the colon should be repeated every 5-10 years until age 66. However, if early forms of precancerous polyps or small growths are found or if you have a family history or genetic risk for colorectal cancer, you may need to be screened more often.  Skin Cancer  Check your skin from head to toe regularly.  Monitor any moles. Be sure to tell your health care provider: ? About any new moles or changes in moles, especially if there is a change in a mole's shape or color. ? If you have a mole  that is larger than the size of a pencil eraser.  If any of your family members has a history of skin cancer, especially at a young age, talk with your health care provider about genetic screening.  Always use sunscreen. Apply sunscreen liberally and repeatedly throughout the day.  Whenever you are outside, protect yourself by wearing long sleeves, pants, a wide-brimmed hat, and sunglasses.  What should I know about osteoporosis? Osteoporosis is a condition in which bone destruction happens more quickly than new bone creation. After menopause, you may be at an increased risk for osteoporosis. To help prevent osteoporosis or the bone fractures that can happen because of osteoporosis, the following is recommended:  If you are  68-20 years old, get at least 1,000 mg of calcium and at least 600 mg of vitamin D per day.  If you are older than age 43 but younger than age 42, get at least 1,200 mg of calcium and at least 600 mg of vitamin D per day.  If you are older than age 74, get at least 1,200 mg of calcium and at least 800 mg of vitamin D per day.  Smoking and excessive alcohol intake increase the risk of osteoporosis. Eat foods that are rich in calcium and vitamin D, and do weight-bearing exercises several times each week as directed by your health care provider. What should I know about how menopause affects my mental health? Depression may occur at any age, but it is more common as you become older. Common symptoms of depression include:  Low or sad mood.  Changes in sleep patterns.  Changes in appetite or eating patterns.  Feeling an overall lack of motivation or enjoyment of activities that you previously enjoyed.  Frequent crying spells.  Talk with your health care provider if you think that you are experiencing depression. What should I know about immunizations? It is important that you get and maintain your immunizations. These include:  Tetanus, diphtheria, and pertussis  (Tdap) booster vaccine.  Influenza every year before the flu season begins.  Pneumonia vaccine.  Shingles vaccine.  Your health care provider may also recommend other immunizations. This information is not intended to replace advice given to you by your health care provider. Make sure you discuss any questions you have with your health care provider. Document Released: 05/03/2005 Document Revised: 09/29/2015 Document Reviewed: 12/13/2014 Elsevier Interactive Patient Education  2018 [employer].

## 2017-09-24 NOTE — Patient Instructions (Signed)
Great to see you. I will call you with your lab results from today and you can view them online.   

## 2017-09-24 NOTE — Assessment & Plan Note (Signed)
Labs today

## 2017-09-24 NOTE — Progress Notes (Signed)
I reviewed health advisor's note, was available for consultation, and agree with documentation and plan.  

## 2017-09-24 NOTE — Progress Notes (Signed)
Had medicare wellness visit this morning with Glenard Haring.  Health Maintenance  Topic Date Due  . DEXA SCAN  09/25/2018 (Originally 07/05/2011)  . INFLUENZA VACCINE  10/23/2017  . COLONOSCOPY  08/28/2018  . MAMMOGRAM  03/06/2019  . TETANUS/TDAP  09/19/2026  . Hepatitis C Screening  Completed  . PNA vac Low Risk Adult  Completed     HTN- Has been controlled on Altace 5 mg adn HCTZ 25 mg.     No CP, SOB, LE edema.  Lab Results  Component Value Date   CREATININE 1.10 09/12/2016   Lab Results  Component Value Date   NA 140 09/12/2016   K 3.5 09/12/2016   CL 101 09/12/2016   CO2 31 09/12/2016     Morbid obesity- chronic issue- she has been going to water aerobic two to three times a week. Has lost 5 pounds since last year.  Wt Readings from Last 3 Encounters:  09/24/17 (!) 315 lb 3 oz (143 kg)  09/24/17 (!) 315 lb 3.2 oz (143 kg)  09/18/16 (!) 311 lb (141.1 kg)     Lab Results  Component Value Date   CREATININE 1.10 09/12/2016   Lab Results  Component Value Date   HGBA1C 6.1 03/17/2015   Lab Results  Component Value Date   CHOL 190 09/12/2016   HDL 44.80 09/12/2016   LDLCALC 116 (H) 09/12/2016   LDLDIRECT 151.3 03/15/2013   TRIG 146.0 09/12/2016   CHOLHDL 4 09/12/2016   Lab Results  Component Value Date   TSH 1.13 09/12/2016   Lab Results  Component Value Date   WBC 4.8 09/12/2016   HGB 14.6 09/12/2016   HCT 43.6 09/12/2016   MCV 82.9 09/12/2016   PLT 205.0 09/12/2016     Patient Active Problem List   Diagnosis Date Noted  . Medicare annual wellness visit, subsequent 09/13/2013  . Family history of malignant neoplasm of gastrointestinal tract 08/27/2013  . KNEE PAIN, BILATERAL 05/01/2009  . PURE HYPERCHOLESTEROLEMIA 02/08/2008  . Pre-diabetes 12/19/2006  . OBESITY, MORBID 07/10/2006  . Essential hypertension 09/06/2005   Past Medical History:  Diagnosis Date  . Arthritis    "both knees" (07/07/2012)  . Hypertension   . PONV (postoperative  nausea and vomiting)    Past Surgical History:  Procedure Laterality Date  . COLONOSCOPY N/A 08/27/2013   Procedure: COLONOSCOPY;  Surgeon: [name], MD;  Location[facility];  Service: Endoscopy;  Laterality: N/A;  . JOINT REPLACEMENT    . TOTAL KNEE ARTHROPLASTY Right 07/07/2012  . TOTAL KNEE ARTHROPLASTY Right 07/07/2012   Procedure: TOTAL KNEE ARTHROPLASTY;  Surgeon: Meredith Pel, MD;  Location: [place];  Service: Orthopedics;  Laterality: Right;  Right Total Knee Arthroplasty  . TOTAL KNEE ARTHROPLASTY Left 10/13/2012   Procedure: TOTAL KNEE ARTHROPLASTY;  Surgeon: Meredith Pel, MD;  Location: [place];  Service: Orthopedics;  Laterality: Left;  Marland Kitchen VAGINAL DELIVERY     X3   Social History   Tobacco Use  . Smoking status: Never Smoker  . Smokeless tobacco: Never Used  Substance Use Topics  . Alcohol use: No  . Drug use: No   Family History  Problem Relation Age of Onset  . Colon cancer Mother   . Colon cancer Father    No Known Allergies Current Outpatient Medications on File Prior to Visit  Medication Sig Dispense Refill  . Calcium Carbonate-Vitamin D (CALCIUM 600+D) 600-400 MG-UNIT per tablet Take 1 tablet by mouth daily.      Marland Kitchen  Omega-3 Fatty Acids (FISH OIL PO) Take by mouth.     No current facility-administered medications on file prior to visit.    The PMH, PSH, Social History, Family History, Medications, and allergies have been reviewed in [facility], and have been updated if relevant.     Review of Systems   Review of Systems  Constitutional: Negative.   HENT: Negative.   Respiratory: Negative.   Cardiovascular: Negative.   Gastrointestinal: Negative.   Endocrine: Negative.   Genitourinary: Negative.   Musculoskeletal: Positive for arthralgias.  Skin: Negative.   Allergic/Immunologic: Negative.   Neurological: Negative.   Hematological: Negative.   Psychiatric/Behavioral: Negative.   All other systems reviewed and are negative.     Physical  Exam BP 132/72 (BP Location: Left Arm, Patient Position: Sitting, Cuff Size: Large)   Pulse 70   Temp 98 F (36.7 C) (Oral)   Ht 5\' 3"  (1.6 m)   Wt (!) 315 lb 3 oz (143 kg)   SpO2 92%   BMI 55.83 kg/m    General:  Morbidly obese no acute distress; alert,appropriate and cooperative throughout examination Head:  normocephalic and atraumatic.   Eyes:  vision grossly intact, PERRL Ears:  R ear normal and L ear normal externally, TMs clear bilaterally Nose:  no external deformity.   Mouth:  good dentition.   Neck:  No deformities, masses, or tenderness noted. Breasts:  No mass, nodules, thickening, tenderness, bulging, retraction, inflamation, nipple discharge or skin changes noted.   Lungs:  Normal respiratory effort, chest expands symmetrically. Lungs are clear to auscultation, no crackles or wheezes. Heart:  Normal rate and regular rhythm. S1 and S2 normal without gallop, murmur, click, rub or other extra sounds. Abdomen:  Bowel sounds positive,abdomen soft and non-tender without masses, organomegaly or hernias noted. Msk:  No deformity or scoliosis noted of thoracic or lumbar spine.   Extremities:  No clubbing, cyanosis, edema, or deformity noted with normal full range of motion of all joints.   Neurologic:  alert & oriented X3 and gait normal.   Skin:  Intact without suspicious lesions or rashes Cervical Nodes:  No lymphadenopathy noted Axillary Nodes:  No palpable lymphadenopathy Psych:  Cognition and judgment appear intact. Alert and cooperative with normal attention span and concentration. No apparent delusions, illusions, hallucinations

## 2017-09-24 NOTE — Assessment & Plan Note (Signed)
Well controlled.  No changes made. 

## 2017-09-24 NOTE — Assessment & Plan Note (Signed)
Will check labs today

## 2017-12-25 ENCOUNTER — Ambulatory Visit (INDEPENDENT_AMBULATORY_CARE_PROVIDER_SITE_OTHER): Payer: Medicare Other

## 2017-12-25 DIAGNOSIS — Z23 Encounter for immunization: Secondary | ICD-10-CM | POA: Diagnosis not present

## 2018-02-02 ENCOUNTER — Other Ambulatory Visit: Payer: Self-pay | Admitting: Family Medicine

## 2018-02-02 DIAGNOSIS — Z1231 Encounter for screening mammogram for malignant neoplasm of breast: Secondary | ICD-10-CM

## 2018-03-16 ENCOUNTER — Ambulatory Visit
Admission: RE | Admit: 2018-03-16 | Discharge: 2018-03-16 | Disposition: A | Discharge status: Home / Self Care | Payer: Medicare Other | Source: Ambulatory Visit | Attending: Family Medicine | Admitting: Family Medicine

## 2018-03-16 DIAGNOSIS — Z1231 Encounter for screening mammogram for malignant neoplasm of breast: Secondary | ICD-10-CM | POA: Diagnosis not present

## 2018-04-01 ENCOUNTER — Ambulatory Visit: Payer: Medicare Other | Admitting: Family Medicine

## 2018-09-14 ENCOUNTER — Telehealth: Payer: Self-pay | Admitting: *Deleted

## 2018-09-14 ENCOUNTER — Other Ambulatory Visit: Payer: Self-pay

## 2018-09-14 ENCOUNTER — Ambulatory Visit: Payer: Self-pay | Admitting: *Deleted

## 2018-09-14 DIAGNOSIS — Z20822 Contact with and (suspected) exposure to covid-19: Secondary | ICD-10-CM

## 2018-09-14 DIAGNOSIS — R6889 Other general symptoms and signs: Secondary | ICD-10-CM | POA: Diagnosis not present

## 2018-09-14 NOTE — Telephone Encounter (Signed)
PEC-Per Dr. Deborra Medina please contact patient to administer COVID-19 test/thx dmf

## 2018-09-14 NOTE — Telephone Encounter (Signed)
Patient inquiring about covid19 testing. Began having fever Saturday evening the high of 102.0 oral. Today 101.0 oral without antipyretics.Denies all other symptoms. Has not traveled nor no known exposures. Transferring to PCP for virtual.    Reason for Disposition . [1] Fever > 101 F (38.3 C) AND [2] age > 72  Answer Assessment - Initial Assessment Questions 1. COVID-19 DIAGNOSIS: "Who made your Coronavirus (COVID-19) diagnosis?" "Was it confirmed by a positive lab test?" If not diagnosed by a HCP, ask "Are there lots of cases (community spread) where you live?" (See public health department website, if unsure Has not been tested. ONSET: "When did the COVID-19 symptoms start?"      Saturday evening 3. WORST SYMPTOM: "What is your worst symptom?" (e.g., cough, fever, shortness of breath, muscle aches)    none 4. COUGH: "Do you have a cough?" If so, ask: "How bad is the cough?"      none 5. FEVER: "Do you have a fever?" If so, ask: "What is your temperature, how was it measured, and when did it start?"     101.1 oral, last night 99.8 and Saturday night 102. 6. RESPIRATORY STATUS: "Describe your breathing?" (e.g., shortness of breath, wheezing, unable to speak)     normal 7. BETTER-SAME-WORSE: "Are you getting better, staying the same or getting worse compared to yesterday?"  If getting worse, ask, "In what way?"     No worse 8. HIGH RISK DISEASE: "Do you have any chronic medical problems?" (e.g., asthma, heart or lung disease, weak immune system, etc.)     none 9. PREGNANCY: "Is there any chance you are pregnant?" "When was your last menstrual period?"     na 10. OTHER SYMPTOMS: "Do you have any other symptoms?"  (e.g., chills, fatigue, headache, loss of smell or taste, muscle pain, sore throat)       None  Protocols used: CORONAVIRUS (COVID-19) DIAGNOSED OR SUSPECTED-A-AH

## 2018-09-14 NOTE — Telephone Encounter (Signed)
Pt scheduled for covid testing today @ [facility] @ 11:15. Instructions given. Orders placed

## 2018-09-19 LAB — NOVEL CORONAVIRUS, NAA: SARS-CoV-2, NAA: NOT DETECTED

## 2018-09-29 DIAGNOSIS — Z0000 Encounter for general adult medical examination without abnormal findings: Secondary | ICD-10-CM | POA: Insufficient documentation

## 2018-09-29 NOTE — Progress Notes (Signed)
Subjective:   [name] is a 73 y.o. female who presents for Medicare Annual (Subsequent) preventive examination.  Review of Systems: No ROS.  Medicare Wellness Virtual Visit.  Visual/audio telehealth visit, UTA vital signs.   See social history for additional risk factors. Cardiac Risk Factors include: advanced age (>81men, >43 women);hypertension;obesity (BMI >30kg/m2) Sleep patterns: no issues Home Safety/Smoke Alarms: Feels safe in home. Smoke alarms in place.  Lives with husband in 1 story home.    Female:         Mammo- 03/16/18       Dexa scan-  declines      CCS- due 2020. Pt states she will call Dr.Brodie's office to schedule.     Objective:     Vitals: BP 140/80 (BP Location: Left Arm, Patient Position: Sitting, Cuff Size: Large)   Pulse 84   Temp (!) 97.5 F (36.4 C) (Oral)   Ht 5\' 3"  (1.6 m)   Wt (!) 306 lb 6.4 oz (139 kg)   SpO2 97%   BMI 54.28 kg/m   Body mass index is 54.28 kg/m.  Advanced Directives 09/30/2018 09/24/2017 08/27/2013 08/18/2013 10/13/2012 10/13/2012 10/05/2012  Does Patient Have a Medical Advance Directive? Yes No Patient does not have advance directive Patient does not have advance directive Patient does not have advance directive;Patient would like information - Patient does not have advance directive;Patient would like information  Type of Advance Directive [place];Living will - - - - - -  Does patient want to make changes to medical advance directive? No - Patient declined - - - - - -  Copy of [place] in Chart? No - copy requested - - - - - -  Would patient like information on creating a medical advance directive? - No - Patient declined - - Advance directive packet given - -  Pre-existing out of facility DNR order (yellow form or pink MOST form) - - No No No No No    Tobacco Social History   Tobacco Use  Smoking Status Never Smoker  Smokeless Tobacco Never Used     Counseling given: Not Answered    Clinical Intake:    Pain : No/denies pain    Past Medical History:  Diagnosis Date  . Arthritis    "both knees" (07/07/2012)  . Hypertension   . PONV (postoperative nausea and vomiting)    Past Surgical History:  Procedure Laterality Date  . COLONOSCOPY N/A 08/27/2013   Procedure: COLONOSCOPY;  Surgeon: [name], MD;  [facility];  Service: Endoscopy;  Laterality: N/A;  . JOINT REPLACEMENT    . TOTAL KNEE ARTHROPLASTY Right 07/07/2012  . TOTAL KNEE ARTHROPLASTY Right 07/07/2012   Procedure: TOTAL KNEE ARTHROPLASTY;  Surgeon: Meredith Pel, MD;  Location: [place];  Service: Orthopedics;  Laterality: Right;  Right Total Knee Arthroplasty  . TOTAL KNEE ARTHROPLASTY Left 10/13/2012   Procedure: TOTAL KNEE ARTHROPLASTY;  Surgeon: Meredith Pel, MD;  Location: [place];  Service: Orthopedics;  Laterality: Left;  Marland Kitchen VAGINAL DELIVERY     X3   Family History  Problem Relation Age of Onset  . Colon cancer Mother   . Colon cancer Father    Social History   Socioeconomic History  . Marital status: Married    Spouse name: Not on file  . Number of children: Not on file  . Years of education: Not on file  . Highest education level: Not on file  Occupational History  .  Not on file  Social Needs  . Financial resource strain: Not on file  . Food insecurity    Worry: Not on file    Inability: Not on file  . Transportation needs    Medical: Not on file    Non-medical: Not on file  Tobacco Use  . Smoking status: Never Smoker  . Smokeless tobacco: Never Used  Substance and Sexual Activity  . Alcohol use: No  . Drug use: No  . Sexual activity: Not Currently  Lifestyle  . Physical activity    Days per week: Not on file    Minutes per session: Not on file  . Stress: Not on file  Relationships  . Social Herbalist on phone: Not on file    Gets together: Not on file    Attends religious service: Not on file    Active member of club or organization:  Not on file    Attends meetings of clubs or organizations: Not on file    Relationship status: Not on file  Other Topics Concern  . Not on file  Social History Narrative   Desires CPR.   Would want life support but not for prolonged period of time.   Does not want feeding tubes.   Has discussed this with her husband, [name].    Outpatient Encounter Medications as of 09/30/2018  Medication Sig  . Calcium Carbonate-Vitamin D (CALCIUM 600+D) 600-400 MG-UNIT per tablet Take 1 tablet by mouth daily.    . hydrochlorothiazide (HYDRODIURIL) 25 MG tablet TAKE 1 TABLET BY MOUTH EVERY DAY  . Omega-3 Fatty Acids (FISH OIL PO) Take by mouth.  . ramipril (ALTACE) 5 MG capsule TAKE 1 CAPSULE BY MOUTH EVERY DAY  . [DISCONTINUED] hydrochlorothiazide (HYDRODIURIL) 25 MG tablet TAKE 1 TABLET (25 MG TOTAL) BY MOUTH DAILY.   No facility-administered encounter medications on file as of 09/30/2018.     Activities of Daily Living In your present state of health, do you have any difficulty performing the following activities: 09/30/2018  Hearing? N  Vision? N  Difficulty concentrating or making decisions? N  Walking or climbing stairs? N  Dressing or bathing? N  Doing errands, shopping? N  Preparing Food and eating ? N  Using the Toilet? N  In the past six months, have you accidently leaked urine? N  Do you have problems with loss of bowel control? N  Managing your Medications? N  Managing your Finances? N  Housekeeping or managing your Housekeeping? N  Some recent data might be hidden    Patient Care Team: Lucille Passy, MD as PCP - General (Family Medicine) [name], MD (Inactive) as Consulting Physician (Gastroenterology) Marlou Sa Tonna Corner, MD as Consulting Physician (Orthopedic Surgery)    Assessment:   This is a routine wellness examination for [place]. Physical assessment deferred to PCP.  Exercise Activities and Dietary recommendations Current Exercise Habits: The patient does not  participate in regular exercise at present, Exercise limited by: None identified Diet (meal preparation, eat out, water intake, caffeinated beverages, dairy products, fruits and vegetables): in general, a "healthy" diet  , well balanced, on average, 3 meals per day  Goals    . Weight (lb) < 300 lb (136.1 kg)     By decreasing snacking and adding an extra day of swimming.       Fall Risk Fall Risk  09/30/2018 09/24/2017 09/18/2016 09/18/2015 09/15/2014  Falls in the past year? 0 No No No No  Depression Screen PHQ 2/9 Scores 09/30/2018 09/24/2017 09/18/2016 09/18/2015  PHQ - 2 Score 0 0 0 0     Cognitive Function Ad8 score reviewed for issues:  Issues making decisions:no  Less interest in hobbies / activities: no  Repeats questions, stories (family complaining):no  Trouble using ordinary gadgets (microwave, computer, phone):no  Forgets the month or year: no  Mismanaging finances: no  Remembering appts:no  Daily problems with thinking and/or memory:no Ad8 score is=0     MMSE - Mini Mental State Exam 09/24/2017  Orientation to time 5  Orientation to Place 5  Registration 3  Attention/ Calculation 5  Recall 3  Language- name 2 objects 2  Language- repeat 1  Language- follow 3 step command 3  Language- read & follow direction 1  Write a sentence 1  Copy design 1  Total score 30        Immunization History  Administered Date(s) Administered  . Influenza Split 12/19/2010, 12/18/2011  . Influenza Whole 01/02/2007, 12/23/2007, 12/21/2008, 12/13/2009  . Influenza,inj,Quad PF,6+ Mos 12/15/2012, 12/22/2013, 12/07/2014, 12/29/2015, 12/27/2016, 12/25/2017  . Pneumococcal Conjugate-13 09/13/2013  . Pneumococcal Polysaccharide-23 07/08/2012  . Td 09/03/2005  . Tdap 09/18/2016  . Zoster 05/01/2009    Screening Tests Health Maintenance  Topic Date Due  . DEXA SCAN  07/05/2011  . COLONOSCOPY  08/28/2018  . INFLUENZA VACCINE  10/24/2018  . MAMMOGRAM  03/16/2020  .  TETANUS/TDAP  09/19/2026  . Hepatitis C Screening  Completed  . PNA vac Low Risk Adult  Completed       Plan:    Please schedule your next medicare wellness visit with me in 1 yr.  Continue to eat heart healthy diet (full of fruits, vegetables, whole grains, lean protein, water--limit salt, fat, and sugar intake) and increase physical activity as tolerated.  Continue doing brain stimulating activities (puzzles, reading, adult coloring books, staying active) to keep memory sharp.   Bring a copy of your living will and/or healthcare power of attorney to your next office visit.    I have personally reviewed and noted the following in the patient's chart:   . Medical and social history . Use of alcohol, tobacco or illicit drugs  . Current medications and supplements . Functional ability and status . Nutritional status . Physical activity . Advanced directives . List of other physicians . Hospitalizations, surgeries, and ER visits in previous 12 months . Vitals . Screenings to include cognitive, depression, and falls . Referrals and appointments  In addition, I have reviewed and discussed with patient certain preventive protocols, quality metrics, and best practice recommendations. A written personalized care plan for preventive services as well as general preventive health recommendations were provided to patient.     Shela Nevin, [place]  09/30/2018

## 2018-09-29 NOTE — Progress Notes (Signed)
Very pleasant 73 yo female here for follow up.  Had medicare wellness visit this morning with Glenard Haring.  Cellulitis- lower left a week ago- was red, warm but now grown in size and is draining. No fevers. She has put peroxide on it once but other than that, just let's warm water run on it and keeps it unwrapped.  Health Maintenance  Topic Date Due  . DEXA SCAN  07/05/2011  . COLONOSCOPY  08/28/2018  . INFLUENZA VACCINE  10/24/2018  . MAMMOGRAM  03/16/2020  . TETANUS/TDAP  09/19/2026  . Hepatitis C Screening  Completed  . PNA vac Low Risk Adult  Completed   Colonoscopy was done by Delfin Edis on 08/27/13- normal but advised 5 year recall due to family history of colon CA in both parents.  Mammogram 03/16/18.  HTN- Has been controlled on Altace 5 mg and HCTZ 25 mg.    BP Readings from Last 3 Encounters:  09/30/18 140/80  09/30/18 140/80  09/24/17 132/72     No CP, SOB, LE edema.  Lab Results  Component Value Date   CREATININE 0.98 09/24/2017   Lab Results  Component Value Date   NA 139 09/24/2017   K 3.8 09/24/2017   CL 98 09/24/2017   CO2 33 (H) 09/24/2017     Morbid obesity- chronic issue- she has been going to water aerobic two to three times a week. Has lost 5 pounds since last year.  Wt Readings from Last 3 Encounters:  09/30/18 (!) 306 lb 6.4 oz (139 kg)  09/30/18 (!) 306 lb 6.4 oz (139 kg)  09/24/17 (!) 315 lb 3 oz (143 kg)     Lab Results  Component Value Date   CREATININE 0.98 09/24/2017   Lab Results  Component Value Date   HGBA1C 6.0 09/24/2017   Lab Results  Component Value Date   CHOL 186 09/24/2017   HDL 46.10 09/24/2017   LDLCALC 107 (H) 09/24/2017   LDLDIRECT 151.3 03/15/2013   TRIG 165.0 (H) 09/24/2017   CHOLHDL 4 09/24/2017   Lab Results  Component Value Date   TSH 0.89 09/24/2017   Lab Results  Component Value Date   WBC 3.9 (L) 09/24/2017   HGB 14.2 09/24/2017   HCT 42.2 09/24/2017   MCV 83.0 09/24/2017   PLT 188.0  09/24/2017     Patient Active Problem List   Diagnosis Date Noted  . Family history of colon cancer in father 08/27/2013  . KNEE PAIN, BILATERAL 05/01/2009  . PURE HYPERCHOLESTEROLEMIA 02/08/2008  . Pre-diabetes 12/19/2006  . OBESITY, MORBID 07/10/2006  . Essential hypertension 09/06/2005   Past Medical History:  Diagnosis Date  . Arthritis    "both knees" (07/07/2012)  . Hypertension   . PONV (postoperative nausea and vomiting)    Past Surgical History:  Procedure Laterality Date  . COLONOSCOPY N/A 08/27/2013   Procedure: COLONOSCOPY;  Surgeon: [name], MD;  [facility];  Service: Endoscopy;  Laterality: N/A;  . JOINT REPLACEMENT    . TOTAL KNEE ARTHROPLASTY Right 07/07/2012  . TOTAL KNEE ARTHROPLASTY Right 07/07/2012   Procedure: TOTAL KNEE ARTHROPLASTY;  Surgeon: Meredith Pel, MD;  Location: [place];  Service: Orthopedics;  Laterality: Right;  Right Total Knee Arthroplasty  . TOTAL KNEE ARTHROPLASTY Left 10/13/2012   Procedure: TOTAL KNEE ARTHROPLASTY;  Surgeon: Meredith Pel, MD;  Location: [place];  Service: Orthopedics;  Laterality: Left;  Marland Kitchen VAGINAL DELIVERY     X3   Social History  Tobacco Use  . Smoking status: Never Smoker  . Smokeless tobacco: Never Used  Substance Use Topics  . Alcohol use: No  . Drug use: No   Family History  Problem Relation Age of Onset  . Colon cancer Mother   . Colon cancer Father    No Known Allergies Current Outpatient Medications on File Prior to Visit  Medication Sig Dispense Refill  . Calcium Carbonate-Vitamin D (CALCIUM 600+D) 600-400 MG-UNIT per tablet Take 1 tablet by mouth daily.      . hydrochlorothiazide (HYDRODIURIL) 25 MG tablet TAKE 1 TABLET BY MOUTH EVERY DAY 90 tablet 3  . Omega-3 Fatty Acids (FISH OIL PO) Take by mouth.    . ramipril (ALTACE) 5 MG capsule TAKE 1 CAPSULE BY MOUTH EVERY DAY 90 capsule 3   No current facility-administered medications on file prior to visit.    The PMH, PSH,  Social History, Family History, Medications, and allergies have been reviewed in [facility], and have been updated if relevant.     Review of Systems   Review of Systems  Constitutional: Negative.   HENT: Negative.   Respiratory: Negative.   Cardiovascular: Negative.   Gastrointestinal: Negative.   Endocrine: Negative.   Genitourinary: Negative.   Musculoskeletal: Positive for arthralgias.  Skin: Positive for rash.  Allergic/Immunologic: Negative.   Neurological: Negative.   Hematological: Negative.   Psychiatric/Behavioral: Negative.   All other systems reviewed and are negative.     Physical Exam BP 140/80   Pulse 84   Temp (!) 97.5 F (36.4 C) (Oral)   Ht 5\' 3"  (1.6 m)   Wt (!) 306 lb 6.4 oz (139 kg)   SpO2 97%   BMI 54.28 kg/m    General:  Morbidly obese no acute distress; alert,appropriate and cooperative  Head:  normocephalic and atraumatic.   Eyes:  vision grossly intact, PERRL Ears:  R ear normal and L ear normal externally, TMs clear bilaterally Nose:  no external deformity.   Mouth:  good dentition.   Neck:  No deformities, masses, or tenderness noted. Breasts:  No mass, nodules, thickening, tenderness, bulging, retraction, inflamation, nipple discharge or skin changes noted.   Lungs:  Normal respiratory effort, chest expands symmetrically. Lungs are clear to auscultation, no crackles or wheezes. Heart:  Normal rate and regular rhythm. S1 and S2 normal without gallop, murmur, click, rub or other extra sounds. Abdomen:  Bowel sounds positive,abdomen soft and non-tender without masses, organomegaly or hernias noted. Msk:  No deformity or scoliosis noted of thoracic or lumbar spine.   Extremities:  Left lower leg- larger area covering around 5 inches of her left lower leg- warm, red and draining thick, drainage.  No foul odor. Neurologic:  alert & oriented X3 and gait normal.   Skin:  Intact without suspicious lesions or rashes Cervical Nodes:  No lymphadenopathy  noted Axillary Nodes:  No palpable lymphadenopathy Psych:  Cognition and judgment appear intact. Alert and cooperative with normal attention span and concentration. No apparent delusions, illusions, hallucinations

## 2018-09-30 ENCOUNTER — Ambulatory Visit (INDEPENDENT_AMBULATORY_CARE_PROVIDER_SITE_OTHER): Payer: Medicare Other | Admitting: *Deleted

## 2018-09-30 ENCOUNTER — Encounter: Payer: Self-pay | Admitting: *Deleted

## 2018-09-30 ENCOUNTER — Encounter: Payer: Self-pay | Admitting: Family Medicine

## 2018-09-30 ENCOUNTER — Ambulatory Visit (INDEPENDENT_AMBULATORY_CARE_PROVIDER_SITE_OTHER): Payer: Medicare Other | Admitting: Family Medicine

## 2018-09-30 VITALS — BP 140/80 | HR 84 | Temp 97.5°F | Ht 63.0 in | Wt 306.4 lb

## 2018-09-30 DIAGNOSIS — R7303 Prediabetes: Secondary | ICD-10-CM | POA: Diagnosis not present

## 2018-09-30 DIAGNOSIS — Z0000 Encounter for general adult medical examination without abnormal findings: Secondary | ICD-10-CM

## 2018-09-30 DIAGNOSIS — I10 Essential (primary) hypertension: Secondary | ICD-10-CM

## 2018-09-30 DIAGNOSIS — Z800 Family history of malignant neoplasm of digestive organs: Secondary | ICD-10-CM | POA: Diagnosis not present

## 2018-09-30 DIAGNOSIS — L02416 Cutaneous abscess of left lower limb: Secondary | ICD-10-CM | POA: Insufficient documentation

## 2018-09-30 DIAGNOSIS — E7800 Pure hypercholesterolemia, unspecified: Secondary | ICD-10-CM

## 2018-09-30 DIAGNOSIS — Z1211 Encounter for screening for malignant neoplasm of colon: Secondary | ICD-10-CM | POA: Diagnosis not present

## 2018-09-30 DIAGNOSIS — L03116 Cellulitis of left lower limb: Secondary | ICD-10-CM | POA: Diagnosis not present

## 2018-09-30 LAB — LIPID PANEL
Cholesterol: 146 mg/dL (ref 0–200)
HDL: 42.9 mg/dL (ref >39.00)
LDL Cholesterol: 71 mg/dL (ref 0–99)
NonHDL: 103.07
Total CHOL/HDL Ratio: 3
Triglycerides: 158 mg/dL — ABNORMAL HIGH (ref 0.0–149.0)
VLDL: 31.6 mg/dL (ref 0.0–40.0)

## 2018-09-30 LAB — COMPREHENSIVE METABOLIC PANEL
ALT: 15 U/L (ref 0–35)
AST: 20 U/L (ref 0–37)
Albumin: 3.5 g/dL (ref 3.5–5.2)
Alkaline Phosphatase: 60 U/L (ref 39–117)
BUN: 21 mg/dL (ref 6–23)
CO2: 30 mEq/L (ref 19–32)
Calcium: 9.7 mg/dL (ref 8.4–10.5)
Chloride: 99 mEq/L (ref 96–112)
Creatinine, Ser: 0.87 mg/dL (ref 0.40–1.20)
GFR: 63.96 mL/min (ref >60.00)
Glucose, Bld: 114 mg/dL — ABNORMAL HIGH (ref 70–99)
Potassium: 4 mEq/L (ref 3.5–5.1)
Sodium: 139 mEq/L (ref 135–145)
Total Bilirubin: 0.6 mg/dL (ref 0.2–1.2)
Total Protein: 7.7 g/dL (ref 6.0–8.3)

## 2018-09-30 LAB — TSH: TSH: 0.71 u[IU]/mL (ref 0.35–4.50)

## 2018-09-30 LAB — CBC WITH DIFFERENTIAL/PLATELET
Basophils Absolute: 0.1 10*3/uL (ref 0.0–0.1)
Basophils Relative: 1.2 % (ref 0.0–3.0)
Eosinophils Absolute: 0.1 10*3/uL (ref 0.0–0.7)
Eosinophils Relative: 2 % (ref 0.0–5.0)
HCT: 39.2 % (ref 36.0–46.0)
Hemoglobin: 13.1 g/dL (ref 12.0–15.0)
Lymphocytes Relative: 24 % (ref 12.0–46.0)
Lymphs Abs: 1.5 10*3/uL (ref 0.7–4.0)
MCHC: 33.6 g/dL (ref 30.0–36.0)
MCV: 83 fl (ref 78.0–100.0)
Monocytes Absolute: 0.5 10*3/uL (ref 0.1–1.0)
Monocytes Relative: 8.3 % (ref 3.0–12.0)
Neutro Abs: 3.9 10*3/uL (ref 1.4–7.7)
Neutrophils Relative %: 64.5 % (ref 43.0–77.0)
Platelets: 397 10*3/uL (ref 150.0–400.0)
RBC: 4.72 Mil/uL (ref 3.87–5.11)
RDW: 14 % (ref 11.5–15.5)
WBC: 6.1 10*3/uL (ref 4.0–10.5)

## 2018-09-30 LAB — HEMOGLOBIN A1C: Hgb A1c MFr Bld: 6.4 % (ref 4.6–6.5)

## 2018-09-30 MED ORDER — SULFAMETHOXAZOLE-TRIMETHOPRIM 800-160 MG PO TABS: 1.0000 | ORAL_TABLET | Freq: Two times a day (BID) | ORAL | 0 refills | Status: DC

## 2018-09-30 NOTE — Assessment & Plan Note (Signed)
a1c done today.

## 2018-09-30 NOTE — Assessment & Plan Note (Signed)
Refer to GI for recall colonoscopy. The patient indicates understanding of these issues and agrees with the plan.

## 2018-09-30 NOTE — Patient Instructions (Addendum)
Great to see you. I will call you with your lab results from today and you can view them online.  Take Bactrim as directed, wrap your cellulitis at night especially.    Skin Abscess  A skin abscess is an infected area on or under your skin that contains a collection of pus and other material. An abscess may also be called a furuncle, carbuncle, or boil. An abscess can occur in or on almost any part of your body. Some abscesses break open (rupture) on their own. Most continue to get worse unless they are treated. The infection can spread deeper into the body and eventually into your blood, which can make you feel ill. Treatment usually involves draining the abscess. What are the causes? An abscess occurs when germs, like bacteria, pass through your skin and cause an infection. This may be caused by:  A scrape or cut on your skin.  A puncture wound through your skin, including a needle injection or insect bite.  Blocked oil or sweat glands.  Blocked and infected hair follicles.  A cyst that forms beneath your skin (sebaceous cyst) and becomes infected. What increases the risk? This condition is more likely to develop in people who:  Have a weak body defense system (immune system).  Have diabetes.  Have dry and irritated skin.  Get frequent injections or use illegal IV drugs.  Have a foreign body in a wound, such as a splinter.  Have problems with their lymph system or veins. What are the signs or symptoms? Symptoms of this condition include:  A painful, firm bump under the skin.  A bump with pus at the top. This may break through the skin and drain. Other symptoms include:  Redness surrounding the abscess site.  Warmth.  Swelling of the lymph nodes (glands) near the abscess.  Tenderness.  A sore on the skin. How is this diagnosed? This condition may be diagnosed based on:  A physical exam.  Your medical history.  A sample of pus. This may be used to find out  what is causing the infection.  Blood tests.  Imaging tests, such as an ultrasound, CT scan, or MRI. How is this treated? A small abscess that drains on its own may not need treatment. Treatment for larger abscesses may include:  Moist heat or heat pack applied to the area several times a day.  A procedure to drain the abscess (incision and drainage).  Antibiotic medicines. For a severe abscess, you may first get antibiotics through an IV and then change to antibiotics by mouth. Follow these instructions at home: Medicines   Take over-the-counter and prescription medicines only as told by your health care provider.  If you were prescribed an antibiotic medicine, take it as told by your health care provider. Do not stop taking the antibiotic even if you start to feel better. Abscess care   Follow instructions from your health care provider about how to take care of your abscess. Make sure you: ? Cover the abscess with a bandage (dressing). ? Change your dressing or gauze as told by your health care provider. ? Wash your hands with soap and water before you change the dressing or gauze. If soap and water are not available, use hand sanitizer.  Check your abscess every day for signs of a worsening infection. Check for: ? More redness, swelling, or pain. ? More fluid or blood. ? Warmth. ? More pus or a bad smell. General instructions  To avoid spreading the  infection: ? Do not share personal care items, towels, or hot tubs with others. ? Avoid making skin contact with other people.  Keep all follow-up visits as told by your health care provider. This is important. Contact a health care provider if you have:  More redness, swelling, or pain around your abscess.  More fluid or blood coming from your abscess.  Warm skin around your abscess.  More pus or a bad smell coming from your abscess.  A fever.  Muscle aches.  Chills or a general ill feeling. Get help right away  if you:  Have severe pain.  See red streaks on your skin spreading away from the abscess. Summary  A skin abscess is an infected area on or under your skin that contains a collection of pus and other material.  A small abscess that drains on its own may not need treatment.  Treatment for larger abscesses may include having a procedure to drain the abscess and taking an antibiotic. This information is not intended to replace advice given to you by your health care provider. Make sure you discuss any questions you have with your health care provider. Document Released: 12/19/2004 Document Revised: 07/02/2018 Document Reviewed: 04/24/2017 Elsevier Patient Education  2020 [employer].

## 2018-09-30 NOTE — Assessment & Plan Note (Signed)
Well controlled.  No changes made. 

## 2018-09-30 NOTE — Patient Instructions (Signed)
Please schedule your next medicare wellness visit with me in 1 yr.  Continue to eat heart healthy diet (full of fruits, vegetables, whole grains, lean protein, water--limit salt, fat, and sugar intake) and increase physical activity as tolerated.  Continue doing brain stimulating activities (puzzles, reading, adult coloring books, staying active) to keep memory sharp.   Bring a copy of your living will and/or healthcare power of attorney to your next office visit.   [name] , Thank you for taking time to come for your Medicare Wellness Visit. I appreciate your ongoing commitment to your health goals. Please review the following plan we discussed and let me know if I can assist you in the future.   These are the goals we discussed: Goals    . Weight (lb) < 300 lb (136.1 kg)     By decreasing snacking and adding an extra day of swimming.       This is a list of the screening recommended for you and due dates:  Health Maintenance  Topic Date Due  . DEXA scan (bone density measurement)  07/05/2011  . Colon Cancer Screening  08/28/2018  . Flu Shot  10/24/2018  . Mammogram  03/16/2020  . Tetanus Vaccine  09/19/2026  .  Hepatitis C: One time screening is recommended by Center for Disease Control  (CDC) for  adults born from [birth year] through 1964.   Completed  . Pneumonia vaccines  Completed    Health Maintenance After Age 62 After age 61, you are at a higher risk for certain long-term diseases and infections as well as injuries from falls. Falls are a major cause of broken bones and head injuries in people who are older than age 88. Getting regular preventive care can help to keep you healthy and well. Preventive care includes getting regular testing and making lifestyle changes as recommended by your health care provider. Talk with your health care provider about:  Which screenings and tests you should have. A screening is a test that checks for a disease when you have no symptoms.  A  diet and exercise plan that is right for you. What should I know about screenings and tests to prevent falls? Screening and testing are the best ways to find a health problem early. Early diagnosis and treatment give you the best chance of managing medical conditions that are common after age 20. Certain conditions and lifestyle choices may make you more likely to have a fall. Your health care provider may recommend:  Regular vision checks. Poor vision and conditions such as cataracts can make you more likely to have a fall. If you wear glasses, make sure to get your prescription updated if your vision changes.  Medicine review. Work with your health care provider to regularly review all of the medicines you are taking, including over-the-counter medicines. Ask your health care provider about any side effects that may make you more likely to have a fall. Tell your health care provider if any medicines that you take make you feel dizzy or sleepy.  Osteoporosis screening. Osteoporosis is a condition that causes the bones to get weaker. This can make the bones weak and cause them to break more easily.  Blood pressure screening. Blood pressure changes and medicines to control blood pressure can make you feel dizzy.  Strength and balance checks. Your health care provider may recommend certain tests to check your strength and balance while standing, walking, or changing positions.  Foot health exam. Foot pain and numbness,  as well as not wearing proper footwear, can make you more likely to have a fall.  Depression screening. You may be more likely to have a fall if you have a fear of falling, feel emotionally low, or feel unable to do activities that you used to do.  Alcohol use screening. Using too much alcohol can affect your balance and may make you more likely to have a fall. What actions can I take to lower my risk of falls? General instructions  Talk with your health care provider about your  risks for falling. Tell your health care provider if: ? You fall. Be sure to tell your health care provider about all falls, even ones that seem minor. ? You feel dizzy, sleepy, or off-balance.  Take over-the-counter and prescription medicines only as told by your health care provider. These include any supplements.  Eat a healthy diet and maintain a healthy weight. A healthy diet includes low-fat dairy products, low-fat (lean) meats, and fiber from whole grains, beans, and lots of fruits and vegetables. Home safety  Remove any tripping hazards, such as rugs, cords, and clutter.  Install safety equipment such as grab bars in bathrooms and safety rails on stairs.  Keep rooms and walkways well-lit. Activity   Follow a regular exercise program to stay fit. This will help you maintain your balance. Ask your health care provider what types of exercise are appropriate for you.  If you need a cane or walker, use it as recommended by your health care provider.  Wear supportive shoes that have nonskid soles. Lifestyle  Do not drink alcohol if your health care provider tells you not to drink.  If you drink alcohol, limit how much you have: ? 0-1 drink a day for women. ? 0-2 drinks a day for men.  Be aware of how much alcohol is in your drink. In the [place] one drink equals one typical bottle of beer (12 oz), one-half glass of wine (5 oz), or one shot of hard liquor (1 oz).  Do not use any products that contain nicotine or tobacco, such as cigarettes and e-cigarettes. If you need help quitting, ask your health care provider. Summary  Having a healthy lifestyle and getting preventive care can help to protect your health and wellness after age 72.  Screening and testing are the best way to find a health problem early and help you avoid having a fall. Early diagnosis and treatment give you the best chance for managing medical conditions that are more common for people who are older than age  52.  Falls are a major cause of broken bones and head injuries in people who are older than age 46. Take precautions to prevent a fall at home.  Work with your health care provider to learn what changes you can make to improve your health and wellness and to prevent falls. This information is not intended to replace advice given to you by your health care provider. Make sure you discuss any questions you have with your health care provider. Document Released: 01/22/2017 Document Revised: 07/02/2018 Document Reviewed: 01/22/2017 Elsevier Patient Education  2020 [employer].

## 2018-09-30 NOTE — Assessment & Plan Note (Signed)
Has been diet controlled.  Due for labs today. Orders Placed This Encounter  Procedures  . WOUND CULTURE  . Hemoglobin A1c  . CBC with Differential/Platelet  . Comprehensive metabolic panel  . Lipid panel  . TSH  . Ambulatory referral to Gastroenterology

## 2018-09-30 NOTE — Assessment & Plan Note (Addendum)
New and very concerning given her co morbidities.  VSS and non toxic appearing.  Wound cx taken and sent.  Showed her how to wrap her leg.   Will place on Bactrim, discussed how to wrap it.  She will send me updates on my chart every day and schedule a virtual visit with me in one week.  I also advised drawing a line where the red streak starts and to let me know or to seek emergent evaluation if it crosses this line or she develops fevers or other worsening symptoms. The patient indicates understanding of these issues and agrees with the plan.

## 2018-10-02 ENCOUNTER — Encounter: Payer: Self-pay | Admitting: Family Medicine

## 2018-10-03 LAB — WOUND CULTURE
MICRO NUMBER:: 645917
SPECIMEN QUALITY:: ADEQUATE

## 2018-10-05 ENCOUNTER — Encounter: Payer: Self-pay | Admitting: Family Medicine

## 2018-10-05 NOTE — Addendum Note (Signed)
Addended by: Lucille Passy on: 10/05/2018 07:20 PM   Modules accepted: Orders

## 2018-10-05 NOTE — Progress Notes (Signed)
Received message from patient saying wound looked better but her son contacted me and thinks it still looks awful.  She is on Bactrim and culture did grow Staph Aureus sensitive to Bactrim which I did place her on.  Will refer urgently to wound center as she is incredibly high risk.

## 2018-10-06 ENCOUNTER — Encounter: Payer: Self-pay | Admitting: Family Medicine

## 2018-10-07 ENCOUNTER — Encounter: Payer: Self-pay | Admitting: Family Medicine

## 2018-10-07 NOTE — Progress Notes (Signed)
Virtual Visit via Video   Due to the COVID-19 pandemic, this visit was completed with telemedicine (audio/video) technology to reduce patient and provider exposure as well as to preserve personal protective equipment.   I connected with Durwin Reges by a video enabled telemedicine application and verified that I am speaking with the correct person using two identifiers. Location patient: Home Location provider: [facility], Office Persons participating in the virtual visit: Durwin Reges, Arnette Norris, MD   I discussed the limitations of evaluation and management by telemedicine and the availability of in person appointments. The patient expressed understanding and agreed to proceed.  Care Team   Patient Care Team: Lucille Passy, MD as PCP - General (Family Medicine) [name], MD (Inactive) as Consulting Physician (Gastroenterology) Marlou Sa Tonna Corner, MD as Consulting Physician (Orthopedic Surgery)  Subjective:   HPI:   I last saw pt on 09/30/18 for a wellness visit but she had quite severe, draining cellulitis of her left leg.  VSS were stable and she appeared non toxic but I was quite concerned given her co morbidities.  Wound cx sent and I placed her on bactrim.  Wound culture grew Staph Aureus sensitive to Bactrim.  She then sent me an update stating that her leg was looking much better but her son thought it was not better.  I placed an urgent referral to wound center.  The referral for wound center was created 7.13.20 as Urgent but she has not gotten a call as of yet. States that it looks much better than it did but is still draining clear liquid and has decreased in amount. She has 1 day left of Bactrim and feels that she may need to continue for a bit longer.  Has not had a fever in 3 days.   Review of Systems  Constitutional: Negative.   Cardiovascular: Positive for leg swelling.  Skin:       + cellulitis  All other systems reviewed and are negative.    Patient  Active Problem List   Diagnosis Date Noted  . Cellulitis and abscess of left leg 09/30/2018  . Family history of colon cancer in father 08/27/2013  . KNEE PAIN, BILATERAL 05/01/2009  . PURE HYPERCHOLESTEROLEMIA 02/08/2008  . Pre-diabetes 12/19/2006  . OBESITY, MORBID 07/10/2006  . Essential hypertension 09/06/2005    Social History   Tobacco Use  . Smoking status: Never Smoker  . Smokeless tobacco: Never Used  Substance Use Topics  . Alcohol use: No    Current Outpatient Medications:  .  Calcium Carbonate-Vitamin D (CALCIUM 600+D) 600-400 MG-UNIT per tablet, Take 1 tablet by mouth daily.  , Disp: , Rfl:  .  hydrochlorothiazide (HYDRODIURIL) 25 MG tablet, TAKE 1 TABLET BY MOUTH EVERY DAY, Disp: 90 tablet, Rfl: 3 .  Omega-3 Fatty Acids (FISH OIL PO), Take by mouth., Disp: , Rfl:  .  ramipril (ALTACE) 5 MG capsule, TAKE 1 CAPSULE BY MOUTH EVERY DAY, Disp: 90 capsule, Rfl: 3 .  sulfamethoxazole-trimethoprim (BACTRIM DS) 800-160 MG tablet, Take 1 tablet by mouth 2 (two) times daily., Disp: 20 tablet, Rfl: 0  No Known Allergies  Objective:  Temp (!) 96.7 F (35.9 C) (Oral)   VITALS: Per patient if applicable, see vitals. GENERAL: Alert, appears well and in no acute distress. HEENT: Atraumatic, conjunctiva clear, no obvious abnormalities on inspection of external nose and ears. NECK: Normal movements of the head and neck. CARDIOPULMONARY: No increased WOB. Speaking in clear sentences. I:E ratio WNL.  MS:  Moves all visible extremities without noticeable abnormality. PSYCH: Pleasant and cooperative, well-groomed. Speech normal rate and rhythm. Affect is appropriate. Insight and judgement are appropriate. Attention is focused, linear, and appropriate.  NEURO: CN grossly intact. Oriented as arrived to appointment on time with no prompting. Moves both UE equally.  SKIN: Left leg cellulitis still apparent on video but I do see less drainage.  Depression screen [facility] 2/9 09/30/2018 09/24/2017  09/18/2016  Decreased Interest 0 0 0  Down, Depressed, Hopeless 0 0 0  PHQ - 2 Score 0 0 0    Assessment and Plan:   Jun was seen today for follow-up.  Diagnoses and all orders for this visit:  Cellulitis and abscess of left leg  Other orders -     sulfamethoxazole-trimethoprim (BACTRIM DS) 800-160 MG tablet; Take 1 tablet by mouth 2 (two) times daily.    Marland Kitchen COVID-19 Education: The signs and symptoms of COVID-19 were discussed with the patient and how to seek care for testing if needed. The importance of social distancing was discussed today. . Reviewed expectations re: course of current medical issues. . Discussed self-management of symptoms. . Outlined signs and symptoms indicating need for more acute intervention. . Patient verbalized understanding and all questions were answered. Marland Kitchen Health Maintenance issues including appropriate healthy diet, exercise, and smoking avoidance were discussed with patient. . See orders for this visit as documented in the electronic medical record.  Arnette Norris, MD  Records requested if needed. Time spent:25 minutes, of which >50% was spent in obtaining information about her symptoms, reviewing her previous labs, evaluations, and treatments, counseling her about her condition (please see the discussed topics above), and developing a plan to further investigate it; she had a number of questions which I addressed.

## 2018-10-08 ENCOUNTER — Telehealth (INDEPENDENT_AMBULATORY_CARE_PROVIDER_SITE_OTHER): Payer: Medicare Other | Admitting: Family Medicine

## 2018-10-08 ENCOUNTER — Encounter: Payer: Self-pay | Admitting: Family Medicine

## 2018-10-08 VITALS — Temp 96.7°F

## 2018-10-08 DIAGNOSIS — L02416 Cutaneous abscess of left lower limb: Secondary | ICD-10-CM

## 2018-10-08 DIAGNOSIS — L03116 Cellulitis of left lower limb: Secondary | ICD-10-CM | POA: Diagnosis not present

## 2018-10-08 MED ORDER — SULFAMETHOXAZOLE-TRIMETHOPRIM 800-160 MG PO TABS: 1.0000 | ORAL_TABLET | Freq: Two times a day (BID) | ORAL | 0 refills | Status: DC

## 2018-10-08 NOTE — Patient Instructions (Signed)
Here is the number is [facility] [address] [place]

## 2018-10-08 NOTE — Assessment & Plan Note (Signed)
Improving but persistent and still needs to be seen by wound center.  I did place urgent referral. I will extend her course of bactrim and have given her the number of the wound center to tall them and she will keep me updated. The patient indicates understanding of these issues and agrees with the plan.

## 2018-10-16 ENCOUNTER — Encounter (HOSPITAL_BASED_OUTPATIENT_CLINIC_OR_DEPARTMENT_OTHER): Payer: Medicare Other | Attending: Internal Medicine

## 2018-10-16 DIAGNOSIS — L03116 Cellulitis of left lower limb: Secondary | ICD-10-CM | POA: Diagnosis not present

## 2018-10-16 DIAGNOSIS — I87322 Chronic venous hypertension (idiopathic) with inflammation of left lower extremity: Secondary | ICD-10-CM | POA: Insufficient documentation

## 2018-10-16 DIAGNOSIS — L309 Dermatitis, unspecified: Secondary | ICD-10-CM | POA: Diagnosis not present

## 2018-10-16 DIAGNOSIS — L97222 Non-pressure chronic ulcer of left calf with fat layer exposed: Secondary | ICD-10-CM | POA: Diagnosis not present

## 2018-10-16 DIAGNOSIS — I890 Lymphedema, not elsewhere classified: Secondary | ICD-10-CM | POA: Insufficient documentation

## 2018-10-16 DIAGNOSIS — L97822 Non-pressure chronic ulcer of other part of left lower leg with fat layer exposed: Secondary | ICD-10-CM | POA: Insufficient documentation

## 2018-10-16 DIAGNOSIS — I87332 Chronic venous hypertension (idiopathic) with ulcer and inflammation of left lower extremity: Secondary | ICD-10-CM | POA: Diagnosis not present

## 2018-10-20 ENCOUNTER — Telehealth (HOSPITAL_COMMUNITY): Payer: Self-pay | Admitting: Rehabilitation

## 2018-10-20 NOTE — Telephone Encounter (Signed)
I contacted this patient to schedule an ultrasound ordered by Dr. Dellia Nims. However, at this time the patient stated that she would call me back when she was ready to schedule this ultrasound. I did reach out to Dr. Janalyn Rouse office and spoke with Vivien Rota to inform their office.

## 2018-10-22 DIAGNOSIS — H5213 Myopia, bilateral: Secondary | ICD-10-CM | POA: Diagnosis not present

## 2018-10-22 DIAGNOSIS — H2513 Age-related nuclear cataract, bilateral: Secondary | ICD-10-CM | POA: Diagnosis not present

## 2018-10-22 DIAGNOSIS — H524 Presbyopia: Secondary | ICD-10-CM | POA: Diagnosis not present

## 2018-10-22 DIAGNOSIS — H353132 Nonexudative age-related macular degeneration, bilateral, intermediate dry stage: Secondary | ICD-10-CM | POA: Diagnosis not present

## 2018-10-22 NOTE — Progress Notes (Signed)
Pt seen in person for awv. Documentation related to being a virtual visit was left in error.

## 2018-11-02 ENCOUNTER — Encounter: Payer: Self-pay | Admitting: Family Medicine

## 2018-11-20 ENCOUNTER — Other Ambulatory Visit: Payer: Self-pay | Admitting: Family Medicine

## 2018-11-20 ENCOUNTER — Encounter: Payer: Self-pay | Admitting: Family Medicine

## 2018-11-20 MED ORDER — RAMIPRIL 5 MG PO CAPS: ORAL_CAPSULE | ORAL | 3 refills | Status: DC

## 2018-12-11 ENCOUNTER — Encounter: Payer: Self-pay | Admitting: Family Medicine

## 2018-12-21 ENCOUNTER — Other Ambulatory Visit: Payer: Self-pay | Admitting: Family Medicine

## 2018-12-28 ENCOUNTER — Other Ambulatory Visit: Payer: Self-pay

## 2018-12-28 ENCOUNTER — Ambulatory Visit (INDEPENDENT_AMBULATORY_CARE_PROVIDER_SITE_OTHER): Payer: Medicare Other | Admitting: Family Medicine

## 2018-12-28 ENCOUNTER — Encounter: Payer: Self-pay | Admitting: Family Medicine

## 2018-12-28 VITALS — BP 134/74 | HR 99 | Temp 97.6°F | Wt 298.8 lb

## 2018-12-28 DIAGNOSIS — L02416 Cutaneous abscess of left lower limb: Secondary | ICD-10-CM | POA: Diagnosis not present

## 2018-12-28 DIAGNOSIS — Z23 Encounter for immunization: Secondary | ICD-10-CM | POA: Diagnosis not present

## 2018-12-28 DIAGNOSIS — L03116 Cellulitis of left lower limb: Secondary | ICD-10-CM | POA: Diagnosis not present

## 2018-12-28 MED ORDER — SULFAMETHOXAZOLE-TRIMETHOPRIM 800-160 MG PO TABS: 1.0000 | ORAL_TABLET | Freq: Two times a day (BID) | ORAL | 0 refills | Status: DC

## 2018-12-28 MED ORDER — MUPIROCIN 2 % EX OINT: 1.0000 "application " | TOPICAL_OINTMENT | Freq: Two times a day (BID) | CUTANEOUS | 0 refills | Status: DC

## 2018-12-28 NOTE — Addendum Note (Signed)
Addended by: Lucille Passy on: 12/28/2018 11:18 AM   Modules accepted: Orders

## 2018-12-28 NOTE — Progress Notes (Signed)
Subjective:   Patient ID: [name], female    DOB: 09-10-1945, 73 y.o.   MRN: [id number]  [name] is a pleasant 73 y.o. year old female who presents to clinic today with Leg Problem (Patient is here today C/O a wound on her left leg. It was Cx in July. It got better but then went back. Went to wound center and states "It was a terrible Dr's appointment." )  on 12/28/2018  HPI:   Left leg wound.  I saw her for this on 09/30/18-  she had quite severe, draining cellulitis of her left leg.  VSS were stable and she appeared non toxic but I was quite concerned given her co morbidities.  Wound cx sent and I placed her on bactrim.  Wound culture grew Staph Aureus sensitive to Bactrim.  She then sent me an update stating that her leg was looking much better but her son thought it was not better.  I placed an urgent referral to wound center.  Per pt, she did not feel comfortable with that office visit.  The Bactrim cleared it up but started getting worse over the past few weeks ago.  She does not want to go back.  Denies fevers, chills, nausea or vomiting.  Has been wrapping it gauze and putting neosporin on it.   Current Outpatient Medications on File Prior to Visit  Medication Sig Dispense Refill  . Calcium Carbonate-Vitamin D (CALCIUM 600+D) 600-400 MG-UNIT per tablet Take 1 tablet by mouth daily.      . hydrochlorothiazide (HYDRODIURIL) 25 MG tablet TAKE 1 TABLET BY MOUTH EVERY DAY 90 tablet 1  . Omega-3 Fatty Acids (FISH OIL PO) Take by mouth.    . ramipril (ALTACE) 5 MG capsule TAKE 1 CAPSULE BY MOUTH EVERY DAY 90 capsule 3   No current facility-administered medications on file prior to visit.     No Known Allergies  Past Medical History:  Diagnosis Date  . Arthritis    "both knees" (07/07/2012)  . Hypertension   . PONV (postoperative nausea and vomiting)     Past Surgical History:  Procedure Laterality Date  . COLONOSCOPY N/A 08/27/2013   Procedure: COLONOSCOPY;   Surgeon: [name], MD;  Location: [facility];  Service: Endoscopy;  Laterality: N/A;  . JOINT REPLACEMENT    . TOTAL KNEE ARTHROPLASTY Right 07/07/2012  . TOTAL KNEE ARTHROPLASTY Right 07/07/2012   Procedure: TOTAL KNEE ARTHROPLASTY;  Surgeon: Meredith Pel, MD;  Location: [place];  Service: Orthopedics;  Laterality: Right;  Right Total Knee Arthroplasty  . TOTAL KNEE ARTHROPLASTY Left 10/13/2012   Procedure: TOTAL KNEE ARTHROPLASTY;  Surgeon: Meredith Pel, MD;  Location: [place];  Service: Orthopedics;  Laterality: Left;  Marland Kitchen VAGINAL DELIVERY     X3    Family History  Problem Relation Age of Onset  . Colon cancer Mother   . Colon cancer Father     Social History   Socioeconomic History  . Marital status: Married    Spouse name: Not on file  . Number of children: Not on file  . Years of education: Not on file  . Highest education level: Not on file  Occupational History  . Not on file  Social Needs  . Financial resource strain: Not on file  . Food insecurity    Worry: Not on file    Inability: Not on file  . Transportation needs    Medical: Not on file    Non-medical: Not on  file  Tobacco Use  . Smoking status: Never Smoker  . Smokeless tobacco: Never Used  Substance and Sexual Activity  . Alcohol use: No  . Drug use: No  . Sexual activity: Not Currently  Lifestyle  . Physical activity    Days per week: Not on file    Minutes per session: Not on file  . Stress: Not on file  Relationships  . Social Herbalist on phone: Not on file    Gets together: Not on file    Attends religious service: Not on file    Active member of club or organization: Not on file    Attends meetings of clubs or organizations: Not on file    Relationship status: Not on file  . Intimate partner violence    Fear of current or ex partner: Not on file    Emotionally abused: Not on file    Physically abused: Not on file    Forced sexual activity: Not on file  Other  Topics Concern  . Not on file  Social History Narrative   Desires CPR.   Would want life support but not for prolonged period of time.   Does not want feeding tubes.   Has discussed this with her husband, [name].   The PMH, PSH, Social History, Family History, Medications, and allergies have been reviewed in [facility], and have been updated if relevant.   Review of Systems  Constitutional: Negative.   HENT: Negative.   Eyes: Negative.   Respiratory: Negative.   Cardiovascular: Negative.   Gastrointestinal: Negative.   Endocrine: Negative.   Genitourinary: Negative.   Musculoskeletal: Negative.   Skin: Positive for wound. Negative for pallor and rash.  Allergic/Immunologic: Negative.   Neurological: Negative.   Hematological: Negative.   Psychiatric/Behavioral: Negative.   All other systems reviewed and are negative.      Objective:    BP 134/74 (BP Location: Right Arm, Patient Position: Sitting, Cuff Size: Large)   Pulse 99   Temp 97.6 F (36.4 C) (Oral)   Wt 298 lb 12.8 oz (135.5 kg)   SpO2 94%   BMI 52.93 kg/m    Physical Exam Vitals signs and nursing note reviewed.  Constitutional:      General: She is not in acute distress.    Appearance: Normal appearance. She is obese. She is not ill-appearing.  HENT:     Head: Normocephalic and atraumatic.     Right Ear: External ear normal.     Left Ear: External ear normal.     Mouth/Throat:     Mouth: Mucous membranes are moist.  Eyes:     Extraocular Movements: Extraocular movements intact.  Neck:     Musculoskeletal: Normal range of motion.  Cardiovascular:     Rate and Rhythm: Normal rate.  Pulmonary:     Effort: Pulmonary effort is normal.  Musculoskeletal: Normal range of motion.  Skin:    Findings: Wound present.       Neurological:     General: No focal deficit present.     Mental Status: She is alert and oriented to person, place, and time.     Cranial Nerves: No cranial nerve deficit.  Psychiatric:         Mood and Affect: Mood normal.        Behavior: Behavior normal.        Thought Content: Thought content normal.        Judgment: Judgment normal.  Assessment & Plan:   Cellulitis and abscess of left leg No follow-ups on file.

## 2018-12-28 NOTE — Patient Instructions (Addendum)
Great to see you.  Please take Bactrim DS- 1 tablet twice daily x 10 days.  Please keep me updated in 1-2 weeks.   Please call Dr. Diona Browner' office to schedule your next Colonoscopy :)

## 2018-12-28 NOTE — Assessment & Plan Note (Signed)
Deteriorated. Place on another course of Bactrim DS- 1 tablet by mouth x 10 days.  Send wound culture to verify it is the same organism with same sensitivities. Also sent in topical abx ointment for future use as we discussed. Discussed wound care. And she will follow up with me in 1-2 weeks by phone or in person. The patient indicates understanding of these issues and agrees with the plan.

## 2018-12-31 LAB — WOUND CULTURE
MICRO NUMBER:: 954503
SPECIMEN QUALITY:: ADEQUATE

## 2019-01-07 ENCOUNTER — Encounter: Payer: Self-pay | Admitting: Family Medicine

## 2019-01-11 MED ORDER — MUPIROCIN 2 % EX OINT: 1.0000 "application " | TOPICAL_OINTMENT | Freq: Two times a day (BID) | CUTANEOUS | 0 refills | Status: DC

## 2019-02-04 ENCOUNTER — Encounter: Payer: Self-pay | Admitting: Family Medicine

## 2019-02-04 ENCOUNTER — Telehealth (INDEPENDENT_AMBULATORY_CARE_PROVIDER_SITE_OTHER): Payer: Medicare Other | Admitting: Family Medicine

## 2019-02-04 ENCOUNTER — Other Ambulatory Visit: Payer: Medicare Other

## 2019-02-04 VITALS — Temp 97.6°F

## 2019-02-04 DIAGNOSIS — L03116 Cellulitis of left lower limb: Secondary | ICD-10-CM | POA: Insufficient documentation

## 2019-02-04 DIAGNOSIS — L02416 Cutaneous abscess of left lower limb: Secondary | ICD-10-CM | POA: Diagnosis not present

## 2019-02-04 DIAGNOSIS — B9561 Methicillin susceptible Staphylococcus aureus infection as the cause of diseases classified elsewhere: Secondary | ICD-10-CM

## 2019-02-04 DIAGNOSIS — L309 Dermatitis, unspecified: Secondary | ICD-10-CM

## 2019-02-04 DIAGNOSIS — L0390 Cellulitis, unspecified: Secondary | ICD-10-CM | POA: Diagnosis not present

## 2019-02-04 NOTE — Assessment & Plan Note (Signed)
Recurrent.   Unable to assess virtually.  She is scheduled for 11am for a repeat Wound C&S and dressing.

## 2019-02-04 NOTE — Assessment & Plan Note (Addendum)
Seems to already be improving with gold bond lotion. Also suggested the following: . Skin Care   Keep your fingernails short to avoid breaking open the skin when you scratch.  Wash your hands with mild soap and water to avoid infection.  Pat your skin dry after bathing or washing your hands. Avoid rubbing your skin.  Keep your skin hydrated. To do this: ? Avoid very hot water. Take lukewarm baths or showers. ? Apply moisturizer within three minutes of bathing. This locks in moisture. ? Use a humidifier when you have the heating or air conditioning on. This will add moisture to the air.  Identify and avoid things that trigger symptoms or irritate your skin. Triggers may include taking long, hot showers or baths, or not using creams or ointments to moisturize. Certain soaps may also trigger this condition. General instructions  Dress in clothes made of cotton or cotton blends. Avoid wearing clothes with wool fabric.  Avoid activities that may cause skin injury. Wear protective clothing when doing outdoor activities such as gardening or hiking. Cuts, scrapes, and insect bites can make symptoms worse.  Call or send my chart message prn if these symptoms worsen or fail to improve as anticipated. The patient indicates understanding of these issues and agrees with the plan.

## 2019-02-04 NOTE — Patient Instructions (Signed)
Skin Care   Keep your fingernails short to avoid breaking open the skin when you scratch.  Wash your hands with mild soap and water to avoid infection.  Pat your skin dry after bathing or washing your hands. Avoid rubbing your skin.  Keep your skin hydrated. To do this: ? Avoid very hot water. Take lukewarm baths or showers. ? Apply moisturizer within three minutes of bathing. This locks in moisture. ? Use a humidifier when you have the heating or air conditioning on. This will add moisture to the air.  Identify and avoid things that trigger symptoms or irritate your skin. Triggers may include taking long, hot showers or baths, or not using creams or ointments to moisturize. Certain soaps may also trigger this condition. General instructions  Dress in clothes made of cotton or cotton blends. Avoid wearing clothes with wool fabric.  Avoid activities that may cause skin injury. Wear protective clothing when doing outdoor activities such as gardening or hiking. Cuts, scrapes, and insect bites can make symptoms worse.  Keep all follow-up visits as told by your health care provider. This is important.

## 2019-02-04 NOTE — Progress Notes (Signed)
Virtual Visit via Video   Due to the COVID-19 pandemic, this visit was completed with telemedicine (audio/video) technology to reduce patient and provider exposure as well as to preserve personal protective equipment.   I connected with Durwin Reges by a video enabled telemedicine application and verified that I am speaking with the correct person using two identifiers. Location patient: Home Location provider: [facility], Office Persons participating in the virtual visit: Durwin Reges, Arnette Norris, MD   I discussed the limitations of evaluation and management by telemedicine and the availability of in person appointments. The patient expressed understanding and agreed to proceed.  Care Team   Patient Care Team: Lucille Passy, MD as PCP - General (Family Medicine) [name], MD (Inactive) as Consulting Physician (Gastroenterology) Marlou Sa Tonna Corner, MD as Consulting Physician (Orthopedic Surgery)   Subjective:   HPI:   She is C/O her hands being overly dry. Gold bond lotion seems to help.  She denies itching or pustules on her palms ( ie disyhdrotic eczema symptoms).  States she would have canceled this appointment but leg wound is worse.   Her wound on her left leg has redness that is coming upward about 2 inches since last seen and is now has clear purulence. The wound place cleaned it out and wrapped it and she is not gonna go back.   Pt has H/O Staph infection and is wondering this has turned into that. She is scheduled for 11am for a repeat Wound C&S and dressing.   Review of Systems  Constitutional: Negative for chills, diaphoresis, fever, malaise/fatigue and weight loss.  HENT: Negative.   Respiratory: Negative.   Cardiovascular: Negative.   Skin: Negative for itching and rash.       Dry skin Worsening leg wound  Neurological: Negative.   Endo/Heme/Allergies: Negative.   Psychiatric/Behavioral: Negative.   All other systems reviewed and are negative.     Patient Active Problem List   Diagnosis Date Noted  . Eczema of both hands 02/04/2019  . Family history of colon cancer in father 08/27/2013  . KNEE PAIN, BILATERAL 05/01/2009  . PURE HYPERCHOLESTEROLEMIA 02/08/2008  . Pre-diabetes 12/19/2006  . OBESITY, MORBID 07/10/2006  . Essential hypertension 09/06/2005    Social History   Tobacco Use  . Smoking status: Never Smoker  . Smokeless tobacco: Never Used  Substance Use Topics  . Alcohol use: No    Current Outpatient Medications:  .  Calcium Carbonate-Vitamin D (CALCIUM 600+D) 600-400 MG-UNIT per tablet, Take 1 tablet by mouth daily.  , Disp: , Rfl:  .  hydrochlorothiazide (HYDRODIURIL) 25 MG tablet, TAKE 1 TABLET BY MOUTH EVERY DAY, Disp: 90 tablet, Rfl: 1 .  Omega-3 Fatty Acids (FISH OIL PO), Take by mouth., Disp: , Rfl:  .  ramipril (ALTACE) 5 MG capsule, TAKE 1 CAPSULE BY MOUTH EVERY DAY, Disp: 90 capsule, Rfl: 3  No Known Allergies  Objective:  Temp 97.6 F (36.4 C) (Oral)   VITALS: Per patient if applicable, see vitals. GENERAL: Alert, appears well and in no acute distress. HEENT: Atraumatic, conjunctiva clear, no obvious abnormalities on inspection of external nose and ears. NECK: Normal movements of the head and neck. CARDIOPULMONARY: No increased WOB. Speaking in clear sentences. I:E ratio WNL.  MS: Moves all visible extremities without noticeable abnormality. PSYCH: Pleasant and cooperative, well-groomed. Speech normal rate and rhythm. Affect is appropriate. Insight and judgement are appropriate. Attention is focused, linear, and appropriate.  NEURO: CN grossly intact. Oriented as arrived to  appointment on time with no prompting. Moves both UE equally.  SKIN: No obvious lesions, wounds, erythema, or cyanosis noted on face or hands. Unable to assess virtually.  Depression screen [facility] 2/9 09/30/2018 09/24/2017 09/18/2016  Decreased Interest 0 0 0  Down, Depressed, Hopeless 0 0 0  PHQ - 2 Score 0 0 0    Assessment and  Plan:   Kaile was seen today for skin problem and addendum.  Diagnoses and all orders for this visit:  Cellulitis and abscess of left leg  Eczema of both hands    . COVID-19 Education: The signs and symptoms of COVID-19 were discussed with the patient and how to seek care for testing if needed. The importance of social distancing was discussed today. . Reviewed expectations re: course of current medical issues. . Discussed self-management of symptoms. . Outlined signs and symptoms indicating need for more acute intervention. . Patient verbalized understanding and all questions were answered. Marland Kitchen Health Maintenance issues including appropriate healthy diet, exercise, and smoking avoidance were discussed with patient. . See orders for this visit as documented in the electronic medical record.  Arnette Norris, MD  Records requested if needed. Time spent: 15 minutes, of which >50% was spent in obtaining information about her symptoms, reviewing her previous labs, evaluations, and treatments, counseling her about her condition (please see the discussed topics above), and developing a plan to further investigate it; she had a number of questions which I addressed.

## 2019-02-09 ENCOUNTER — Telehealth: Payer: Self-pay

## 2019-02-09 NOTE — Telephone Encounter (Signed)
Travel or Contacts:   Any travel in the past 2 weeks? NO  Have you came in contact with anyone who has Covid?NO  Have you had a positive Covid test? If so, when?NO  Fever >100.85F []   Yes [x]   No []   Unknown  Chills []   Yes [x]   No []   Unknown  Muscle aches (myalgia) []   Yes [x]   No []   Unknown  Runny nose (rhinorrhea) []   Yes [x]   No []   Unknown  Sore throat []   Yes [x]   No []   Unknown Cough (new onset or worsening of chronic cough) []   Yes [x]   No []   Unknown  Shortness of breath (dyspnea) []   Yes [x]   No []   Unknown Nausea or vomiting []   Yes [x]   No []   Unknown  Headache []   Yes [x]   No []   Unknown  Abdominal pain  []   Yes [x]   No []   Unknown  Diarrhea (?3 loose/looser than normal stools/24hr period) []   Yes [x]   No []   Unknown Other, s:pecify

## 2019-02-09 NOTE — Patient Instructions (Addendum)
Great to see you, [name]. Take clindamycin as directed- 1 capsule three times daily for 10 days.  You can buy Align over the counter while taking this antibiotic.

## 2019-02-10 ENCOUNTER — Encounter: Payer: Self-pay | Admitting: Family Medicine

## 2019-02-10 ENCOUNTER — Ambulatory Visit (INDEPENDENT_AMBULATORY_CARE_PROVIDER_SITE_OTHER): Payer: Medicare Other | Admitting: Family Medicine

## 2019-02-10 ENCOUNTER — Other Ambulatory Visit: Payer: Self-pay

## 2019-02-10 VITALS — BP 122/76 | Temp 98.5°F | Ht 63.0 in | Wt 294.4 lb

## 2019-02-10 DIAGNOSIS — L03116 Cellulitis of left lower limb: Secondary | ICD-10-CM | POA: Diagnosis not present

## 2019-02-10 LAB — WOUND CULTURE
MICRO NUMBER:: 1094033
SPECIMEN QUALITY:: ADEQUATE

## 2019-02-10 MED ORDER — CLINDAMYCIN HCL 300 MG PO CAPS: 300.0000 mg | ORAL_CAPSULE | Freq: Three times a day (TID) | ORAL | 0 refills | Status: DC

## 2019-02-10 NOTE — Assessment & Plan Note (Addendum)
S/p two rounds of bactrim.  It improved and deteriorated last week- wound cx last week great Staph aureus and pseudomonas. Pan sensitive.  Will treat with Clindamycin 300 mg three times daily.  Redressed wound and discussed wound care. Call or send my chart message prn if these symptoms worsen or fail to improve as anticipated. The patient indicates understanding of these issues and agrees with the plan.

## 2019-02-10 NOTE — Progress Notes (Signed)
Subjective:    Patient ID: [name], female    DOB: 1945-08-22, 73 y.o.   MRN: [id number]  Chief Complaint  Patient presents with  . Leg Pain    Lt leg abcess follow up.    HPI  Patient is in today for Left leg abscess follow up.Her wound on her left leg has redness that is coming upward about 2 inches since last seen and is now has clear purulence.  The wound place cleaned it out and wrapped, it is draining but pt has been using Neosporin on it daily.  Pt c/o pain in her ankle area.  Patient had a wound culture performed last Thursday.  Past Medical History:  Diagnosis Date  . Arthritis    "both knees" (07/07/2012)  . Hypertension   . PONV (postoperative nausea and vomiting)     Past Surgical History:  Procedure Laterality Date  . COLONOSCOPY N/A 08/27/2013   Procedure: COLONOSCOPY;  Surgeon: [name], MD;  Location: [facility];  Service: Endoscopy;  Laterality: N/A;  . JOINT REPLACEMENT    . TOTAL KNEE ARTHROPLASTY Right 07/07/2012  . TOTAL KNEE ARTHROPLASTY Right 07/07/2012   Procedure: TOTAL KNEE ARTHROPLASTY;  Surgeon: Meredith Pel, MD;  Location: [place];  Service: Orthopedics;  Laterality: Right;  Right Total Knee Arthroplasty  . TOTAL KNEE ARTHROPLASTY Left 10/13/2012   Procedure: TOTAL KNEE ARTHROPLASTY;  Surgeon: Meredith Pel, MD;  Location: [place];  Service: Orthopedics;  Laterality: Left;  Marland Kitchen VAGINAL DELIVERY     X3    Family History  Problem Relation Age of Onset  . Colon cancer Mother   . Colon cancer Father     Social History   Socioeconomic History  . Marital status: Married    Spouse name: Not on file  . Number of children: Not on file  . Years of education: Not on file  . Highest education level: Not on file  Occupational History  . Not on file  Social Needs  . Financial resource strain: Not on file  . Food insecurity    Worry: Not on file    Inability: Not on file  . Transportation needs    Medical: Not on file   Non-medical: Not on file  Tobacco Use  . Smoking status: Never Smoker  . Smokeless tobacco: Never Used  Substance and Sexual Activity  . Alcohol use: No  . Drug use: No  . Sexual activity: Not Currently  Lifestyle  . Physical activity    Days per week: Not on file    Minutes per session: Not on file  . Stress: Not on file  Relationships  . Social Herbalist on phone: Not on file    Gets together: Not on file    Attends religious service: Not on file    Active member of club or organization: Not on file    Attends meetings of clubs or organizations: Not on file    Relationship status: Not on file  . Intimate partner violence    Fear of current or ex partner: Not on file    Emotionally abused: Not on file    Physically abused: Not on file    Forced sexual activity: Not on file  Other Topics Concern  . Not on file  Social History Narrative   Desires CPR.   Would want life support but not for prolonged period of time.   Does not want feeding tubes.   Has discussed  this with her husband, [name].    Outpatient Medications Prior to Visit  Medication Sig Dispense Refill  . Calcium Carbonate-Vitamin D (CALCIUM 600+D) 600-400 MG-UNIT per tablet Take 1 tablet by mouth daily.      . hydrochlorothiazide (HYDRODIURIL) 25 MG tablet TAKE 1 TABLET BY MOUTH EVERY DAY 90 tablet 1  . Omega-3 Fatty Acids (FISH OIL PO) Take by mouth.    . ramipril (ALTACE) 5 MG capsule TAKE 1 CAPSULE BY MOUTH EVERY DAY 90 capsule 3   No facility-administered medications prior to visit.     No Known Allergies  Review of Systems  Constitutional: Negative.   HENT: Negative for congestion.   Eyes: Negative.   Respiratory: Negative for shortness of breath.   Cardiovascular: Negative for chest pain and palpitations.  Gastrointestinal: Negative.   Genitourinary: Negative.   Musculoskeletal: Negative for myalgias.  Skin: Negative for itching and rash.  Neurological: Negative.  Negative for  dizziness and loss of consciousness.  Endo/Heme/Allergies: Negative.   Psychiatric/Behavioral: Negative.   All other systems reviewed and are negative.      Objective:    Physical Exam Vitals signs and nursing note reviewed.  Constitutional:      Appearance: Normal appearance. She is not ill-appearing.  HENT:     Head: Normocephalic and atraumatic.     Right Ear: External ear normal.     Left Ear: External ear normal.     Nose: Nose normal.  Eyes:     General:        Right eye: No discharge.        Left eye: No discharge.  Cardiovascular:     Rate and Rhythm: Normal rate and regular rhythm.     Heart sounds: Normal heart sounds.  Pulmonary:     Effort: Pulmonary effort is normal.     Breath sounds: No wheezing.  Abdominal:     Palpations: Abdomen is soft. There is no mass.     Tenderness: There is no abdominal tenderness. There is no guarding.  Musculoskeletal: Normal range of motion.     Right lower leg: No edema.  Skin:    General: Skin is warm and dry.       Neurological:     Mental Status: She is alert and oriented to person, place, and time.     Deep Tendon Reflexes: Reflexes normal.  Psychiatric:        Mood and Affect: Mood normal.        Behavior: Behavior normal.     BP 122/76 (BP Location: Left Arm, Patient Position: Sitting, Cuff Size: Large)   Temp 98.5 F (36.9 C) (Oral)   Ht 5\' 3"  (1.6 m)   Wt 294 lb 6.4 oz (133.5 kg)   BMI 52.15 kg/m  Wt Readings from Last 3 Encounters:  02/10/19 294 lb 6.4 oz (133.5 kg)  12/28/18 298 lb 12.8 oz (135.5 kg)  09/30/18 (!) 306 lb 6.4 oz (139 kg)     Lab Results  Component Value Date   WBC 6.1 09/30/2018   HGB 13.1 09/30/2018   HCT 39.2 09/30/2018   PLT 397.0 09/30/2018   GLUCOSE 114 (H) 09/30/2018   CHOL 146 09/30/2018   TRIG 158.0 (H) 09/30/2018   HDL 42.90 09/30/2018   LDLDIRECT 151.3 03/15/2013   LDLCALC 71 09/30/2018   ALT 15 09/30/2018   AST 20 09/30/2018   NA 139 09/30/2018   K 4.0  09/30/2018   CL 99 09/30/2018   CREATININE 0.87 09/30/2018  BUN 21 09/30/2018   CO2 30 09/30/2018   TSH 0.71 09/30/2018   INR 1.45 10/16/2012   HGBA1C 6.4 09/30/2018    Lab Results  Component Value Date   TSH 0.71 09/30/2018   Lab Results  Component Value Date   WBC 6.1 09/30/2018   HGB 13.1 09/30/2018   HCT 39.2 09/30/2018   MCV 83.0 09/30/2018   PLT 397.0 09/30/2018   Lab Results  Component Value Date   NA 139 09/30/2018   K 4.0 09/30/2018   CO2 30 09/30/2018   GLUCOSE 114 (H) 09/30/2018   BUN 21 09/30/2018   CREATININE 0.87 09/30/2018   BILITOT 0.6 09/30/2018   ALKPHOS 60 09/30/2018   AST 20 09/30/2018   ALT 15 09/30/2018   PROT 7.7 09/30/2018   ALBUMIN 3.5 09/30/2018   CALCIUM 9.7 09/30/2018   GFR 63.96 09/30/2018   Lab Results  Component Value Date   CHOL 146 09/30/2018   Lab Results  Component Value Date   HDL 42.90 09/30/2018   Lab Results  Component Value Date   LDLCALC 71 09/30/2018   Lab Results  Component Value Date   TRIG 158.0 (H) 09/30/2018   Lab Results  Component Value Date   CHOLHDL 3 09/30/2018   Lab Results  Component Value Date   HGBA1C 6.4 09/30/2018       Assessment & Plan:   Problem List Items Addressed This Visit      Active Problems   Left leg cellulitis    S/p two rounds of bactrim.  It improved and deteriorated last week- wound cx last week great Staph aureus and pseudomonas. Pan sensitive.  Will treat with Clindamycin 300 mg three times daily.  Redressed wound and discussed wound care.           I am having Durwin Reges start on clindamycin. I am also having her maintain her Calcium Carbonate-Vitamin D, Omega-3 Fatty Acids (FISH OIL PO), ramipril, and hydrochlorothiazide.  Meds ordered this encounter  Medications  . clindamycin (CLEOCIN) 300 MG capsule    Sig: Take 1 capsule (300 mg total) by mouth 3 (three) times daily.    Dispense:  30 capsule    Refill:  0   The above documentation has been  reviewed and is accurate and complete Arnette Norris, MD   Arnette Norris, MD

## 2019-02-12 ENCOUNTER — Other Ambulatory Visit: Payer: Self-pay | Admitting: Family Medicine

## 2019-02-12 DIAGNOSIS — Z1231 Encounter for screening mammogram for malignant neoplasm of breast: Secondary | ICD-10-CM

## 2019-02-16 ENCOUNTER — Other Ambulatory Visit: Payer: Self-pay

## 2019-02-22 ENCOUNTER — Other Ambulatory Visit: Payer: Self-pay | Admitting: Family Medicine

## 2019-02-22 ENCOUNTER — Encounter: Payer: Self-pay | Admitting: Family Medicine

## 2019-02-22 MED ORDER — DOXYCYCLINE HYCLATE 100 MG PO TABS: 100.0000 mg | ORAL_TABLET | Freq: Two times a day (BID) | ORAL | 0 refills | Status: DC

## 2019-03-05 NOTE — Progress Notes (Signed)
Virtual Visit via Video   Due to the COVID-19 pandemic, this visit was completed with telemedicine (audio/video) technology to reduce patient and provider exposure as well as to preserve personal protective equipment.   I connected with [name] by a video enabled telemedicine application and verified that I am speaking with the correct person using two identifiers. Location patient: Home Location provider: [facility], Office Persons participating in the virtual visit: [name], Arnette Norris, MD   I discussed the limitations of evaluation and management by telemedicine and the availability of in person appointments. The patient expressed understanding and agreed to proceed.  Care Team   Patient Care Team: Lucille Passy, MD as PCP - General (Family Medicine) [name], MD (Inactive) as Consulting Physician (Gastroenterology) Marlou Sa Tonna Corner, MD as Consulting Physician (Orthopedic Surgery)  Subjective:   HPI:   I initially saw her for  her leg wound  on 09/30/18 (face to face visit), note reviewed-  at that time, she had quite severe, draining cellulitis of her left leg. VSS were stable and she appeared non toxic but I was quite concerned given her co morbidities. Wound cx sent and I placed her on bactrim. Wound culture grew Staph Aureus sensitive to Bactrim.  She then sent me an update stating that her leg was looking much better but her son thought it was not better.  I placed an urgent referral to wound center.  Per pt, she did not feel comfortable with that office visit.  The Bactrim cleared it up but started getting worse over the past few weeks ago.  She does not want to go back.  Denies fevers, chills, nausea or vomiting.  Has been wrapping it gauze and putting neosporin on it.  She was afrebrile and not toxic appearing. Started her on Bactrim DS twice daily x 10 days and got a wound cx.  Wound cx from 09/30/18 grew Staph Aureus sensitive to  Bactrim.  I saw her for a virtual visit on 02/03/2018 for worsening cellulitis in the same area-  I had advised her to return to wound cetner. Note viewed.  Per pt: I saw her for this on 09/30/18-  she had quite severe, draining cellulitis of her left leg. VSS were stable and she appeared non toxic but I was quite concerned given her co morbidities. Wound cx sent and I placed her on bactrim. Wound culture grew Staph Aureus sensitive to Bactrim.  She then sent me an update stating that her leg was looking much better but her son thought it was not better.  I placed an urgent referral to wound center.  Per pt, she did not feel comfortable with that office visit.  The Bactrim cleared it up but started getting worse over the past few weeks ago.  She does not want to go back.  Denies fevers, chills, nausea or vomiting.   She returned to clinic on 12/28/18 (note reviewed) because her son thought it looked worse.    Again denied fever, shills, nausea or vomiting.  Repeated course of bactrim and  Repeated wound cx again on 12/28/18 - grew staph aureas again sensitive to bactrim.  On 02/04/19 I saw her for virtual visit for same issue. At that time, her wound on her left leg has redness that was coming upward about 2 inches since last seen and is now has clear purulence. The wound center cleaned it out and wrapped it and she again said she is not [place] go  back.    We had her come in for repeat wound culture and dressing change for that day.  Would cx this time grew Staph aureus AND pseudomonas.  I therefore treated her with clindamycin.   She then sent the following message on 11/30:   "I wanted to let Dr.Pamala Hayman know my leg is much better.  I still have some drainage and would like a refill of the prescription she gave me.  The only problem I had with it is it caused some diarrhea.  I did not have that with the medication she gave me in September.  Maybe a refill of that would be better.   Thanks for your help.  [name]"  I placed her on doxycyline and improved somewhat but became worse once she stopped , but upon further consideration, decided to change that to cipro to cover both pathogens.   Cipro was called in this weekend- has had 3 doses so far and already less red and draining less.  Review of Systems  Constitutional: Negative.  Negative for fever and malaise/fatigue.  HENT: Negative.  Negative for congestion and hearing loss.   Eyes: Negative for blurred vision, discharge and redness.  Respiratory: Negative for cough and shortness of breath.   Cardiovascular: Negative for chest pain, palpitations and leg swelling.  Gastrointestinal: Negative for abdominal pain and heartburn.  Genitourinary: Negative for dysuria.  Musculoskeletal: Negative for falls.  Skin: Positive for rash.       +dry skin  Neurological: Negative for loss of consciousness and headaches.  Endo/Heme/Allergies: Does not bruise/bleed easily.  Psychiatric/Behavioral: Negative for depression.     Patient Active Problem List   Diagnosis Date Noted  . Eczema of both hands 02/04/2019  . Left leg cellulitis 02/04/2019  . Family history of colon cancer in father 08/27/2013  . KNEE PAIN, BILATERAL 05/01/2009  . PURE HYPERCHOLESTEROLEMIA 02/08/2008  . Pre-diabetes 12/19/2006  . OBESITY, MORBID 07/10/2006  . Essential hypertension 09/06/2005    Social History   Tobacco Use  . Smoking status: Never Smoker  . Smokeless tobacco: Never Used  Substance Use Topics  . Alcohol use: No    Current Outpatient Medications:  .  Calcium Carbonate-Vitamin D (CALCIUM 600+D) 600-400 MG-UNIT per tablet, Take 1 tablet by mouth daily.  , Disp: , Rfl:  .  ciprofloxacin (CIPRO) 250 MG tablet, Take 1 tablet (250 mg total) by mouth 2 (two) times daily for 10 days., Disp: 20 tablet, Rfl: 0 .  doxycycline (VIBRA-TABS) 100 MG tablet, Take 1 tablet (100 mg total) by mouth 2 (two) times daily., Disp: 20 tablet,  Rfl: 0 .  hydrochlorothiazide (HYDRODIURIL) 25 MG tablet, TAKE 1 TABLET BY MOUTH EVERY DAY, Disp: 90 tablet, Rfl: 1 .  Omega-3 Fatty Acids (FISH OIL PO), Take by mouth., Disp: , Rfl:  .  ramipril (ALTACE) 5 MG capsule, TAKE 1 CAPSULE BY MOUTH EVERY DAY, Disp: 90 capsule, Rfl: 3  No Known Allergies  Objective:  There were no vitals taken for this visit.  VITALS: Per patient if applicable, see vitals. GENERAL: Alert, appears well and in no acute distress. HEENT: Atraumatic, conjunctiva clear, no obvious abnormalities on inspection of external nose and ears. NECK: Normal movements of the head and neck. CARDIOPULMONARY: No increased WOB. Speaking in clear sentences. I:E ratio WNL.  MS: Moves all visible extremities without noticeable abnormality. PSYCH: Pleasant and cooperative, well-groomed. Speech normal rate and rhythm. Affect is appropriate. Insight and judgement are appropriate. Attention is focused, linear, and appropriate.  NEURO: CN grossly intact. Oriented as arrived to appointment on time with no prompting. Moves both UE equally.  SKIN: cellulitis on same left leg- per pt has become smaller already and not draining.    Depression screen [facility] 2/9 09/30/2018 09/24/2017 09/18/2016  Decreased Interest 0 0 0  Down, Depressed, Hopeless 0 0 0  PHQ - 2 Score 0 0 0     . COVID-19 Education: The signs and symptoms of COVID-19 were discussed with the patient and how to seek care for testing if needed. The importance of social distancing was discussed today. . Reviewed expectations re: course of current medical issues. . Discussed self-management of symptoms. . Outlined signs and symptoms indicating need for more acute intervention. . Patient verbalized understanding and all questions were answered. Marland Kitchen Health Maintenance issues including appropriate healthy diet, exercise, and smoking avoidance were discussed with patient. . See orders for this visit as documented in the electronic medical  record.  Arnette Norris, MD  Records requested if needed. Time spent: 40 minutes, of which >50% was spent in obtaining information about her symptoms, reviewing her previous labs, evaluations, and treatments, counseling her about her condition (please see the discussed topics above), and developing a plan to further investigate it; she had a number of questions which I addressed.   Lab Results  Component Value Date   WBC 6.1 09/30/2018   HGB 13.1 09/30/2018   HCT 39.2 09/30/2018   PLT 397.0 09/30/2018   GLUCOSE 114 (H) 09/30/2018   CHOL 146 09/30/2018   TRIG 158.0 (H) 09/30/2018   HDL 42.90 09/30/2018   LDLDIRECT 151.3 03/15/2013   LDLCALC 71 09/30/2018   ALT 15 09/30/2018   AST 20 09/30/2018   NA 139 09/30/2018   K 4.0 09/30/2018   CL 99 09/30/2018   CREATININE 0.87 09/30/2018   BUN 21 09/30/2018   CO2 30 09/30/2018   TSH 0.71 09/30/2018   INR 1.45 10/16/2012   HGBA1C 6.4 09/30/2018    Lab Results  Component Value Date   TSH 0.71 09/30/2018   Lab Results  Component Value Date   WBC 6.1 09/30/2018   HGB 13.1 09/30/2018   HCT 39.2 09/30/2018   MCV 83.0 09/30/2018   PLT 397.0 09/30/2018   Lab Results  Component Value Date   NA 139 09/30/2018   K 4.0 09/30/2018   CO2 30 09/30/2018   GLUCOSE 114 (H) 09/30/2018   BUN 21 09/30/2018   CREATININE 0.87 09/30/2018   BILITOT 0.6 09/30/2018   ALKPHOS 60 09/30/2018   AST 20 09/30/2018   ALT 15 09/30/2018   PROT 7.7 09/30/2018   ALBUMIN 3.5 09/30/2018   CALCIUM 9.7 09/30/2018   GFR 63.96 09/30/2018   Lab Results  Component Value Date   CHOL 146 09/30/2018   Lab Results  Component Value Date   HDL 42.90 09/30/2018   Lab Results  Component Value Date   LDLCALC 71 09/30/2018   Lab Results  Component Value Date   TRIG 158.0 (H) 09/30/2018   Lab Results  Component Value Date   CHOLHDL 3 09/30/2018   Lab Results  Component Value Date   HGBA1C 6.4 09/30/2018       Assessment & Plan:   Problem List Items  Addressed This Visit      Active Problems   Eczema of both hands   Left leg cellulitis - Primary    Persistent, reoccurring issue.  Had been growing staph aureus alone but her last cx from 02/04/19  did grow both staph and pseudomonas.  So I there for added cipro 250 mg twice daily x 10 days.         I have discontinued Lashandra Gawthrop's clindamycin. I am also having her start on ciprofloxacin. Additionally, I am having her maintain her Calcium Carbonate-Vitamin D, Omega-3 Fatty Acids (FISH OIL PO), ramipril, hydrochlorothiazide, and doxycycline.  Meds ordered this encounter  Medications  . ciprofloxacin (CIPRO) 250 MG tablet    Sig: Take 1 tablet (250 mg total) by mouth 2 (two) times daily for 10 days.    Dispense:  20 tablet    Refill:  0     Arnette Norris, MD

## 2019-03-06 MED ORDER — CIPROFLOXACIN HCL 250 MG PO TABS: 250.0000 mg | ORAL_TABLET | Freq: Two times a day (BID) | ORAL | 0 refills | Status: DC

## 2019-03-06 NOTE — Assessment & Plan Note (Addendum)
Persistent, reoccurring issue.  Had been growing staph aureus alone but her last cx from 02/04/19 did grow both staph and pseudomonas.  So I there for added cipro 250 mg twice daily x 10 days.  She has had three doses and per pt has become smaller already and not draining.   Call or send my chart message prn if these symptoms worsen or fail to improve as anticipated. The patient indicates understanding of these issues and agrees with the plan.

## 2019-03-08 ENCOUNTER — Other Ambulatory Visit: Payer: Self-pay

## 2019-03-08 ENCOUNTER — Ambulatory Visit (INDEPENDENT_AMBULATORY_CARE_PROVIDER_SITE_OTHER): Payer: Medicare Other | Admitting: Family Medicine

## 2019-03-08 VITALS — Temp 97.8°F

## 2019-03-08 DIAGNOSIS — L03116 Cellulitis of left lower limb: Secondary | ICD-10-CM

## 2019-03-08 DIAGNOSIS — L309 Dermatitis, unspecified: Secondary | ICD-10-CM | POA: Diagnosis not present

## 2019-03-17 ENCOUNTER — Encounter: Payer: Self-pay | Admitting: Family Medicine

## 2019-03-18 ENCOUNTER — Other Ambulatory Visit: Payer: Self-pay

## 2019-03-18 DIAGNOSIS — L03116 Cellulitis of left lower limb: Secondary | ICD-10-CM

## 2019-03-18 MED ORDER — CIPROFLOXACIN HCL 250 MG PO TABS: 250.0000 mg | ORAL_TABLET | Freq: Two times a day (BID) | ORAL | 0 refills | Status: AC

## 2019-04-09 ENCOUNTER — Ambulatory Visit: Payer: Medicare Other

## 2019-04-11 NOTE — Progress Notes (Signed)
Virtual Visit via Video   Due to the COVID-19 pandemic, this visit was completed with telemedicine (audio/video) technology to reduce patient and provider exposure as well as to preserve personal protective equipment.   I connected with [name] by a video enabled telemedicine application and verified that I am speaking with the correct person using two identifiers. Location patient: Home Location provider: [facility], Office Persons participating in the virtual visit: [name], Arnette Norris, MD   I discussed the limitations of evaluation and management by telemedicine and the availability of in person appointments. The patient expressed understanding and agreed to proceed.  Interactive audio and video telecommunications were attempted between this provider and patient, however failed, due to patient having technical difficulties OR patient did not have access to video capability.  We continued and completed visit with audio only.   Care Team   Patient Care Team: Lucille Passy, MD as PCP - General (Family Medicine) [name], MD (Inactive) as Consulting Physician (Gastroenterology) Marlou Sa Tonna Corner, MD as Consulting Physician (Orthopedic Surgery)  Subjective:   HPI: Patient agrees to virtual visit.  LLE Edema. This has been going on for a long time. The whole lower leg stays wet. It gets somewhat better during course of abx but gets back to being terrible again. Has been to a wound care specialist in [place] but does not want to go back to that one.    Is open to be referred to somewhere else.  Has received multiple rounds of abx and wound cxs- most recent abx was cipro (prescribed on 03/18/19).    Review of Systems  Constitutional: Negative for fever and malaise/fatigue.  HENT: Negative for congestion and hearing loss.   Eyes: Negative for blurred vision, discharge and redness.  Respiratory: Negative for cough and shortness of breath.   Cardiovascular:  Negative for chest pain, palpitations and leg swelling.  Gastrointestinal: Negative for abdominal pain and heartburn.  Genitourinary: Negative for dysuria.  Musculoskeletal: Negative for falls.  Skin: Negative for rash.       Weaping LLE  Neurological: Negative for loss of consciousness and headaches.  Endo/Heme/Allergies: Does not bruise/bleed easily.  Psychiatric/Behavioral: Negative for depression and memory loss.  All other systems reviewed and are negative.    Patient Active Problem List   Diagnosis Date Noted  . Eczema of both hands 02/04/2019  . Left leg cellulitis 02/04/2019  . Family history of colon cancer in father 08/27/2013  . KNEE PAIN, BILATERAL 05/01/2009  . PURE HYPERCHOLESTEROLEMIA 02/08/2008  . Pre-diabetes 12/19/2006  . OBESITY, MORBID 07/10/2006  . Essential hypertension 09/06/2005    Social History   Tobacco Use  . Smoking status: Never Smoker  . Smokeless tobacco: Never Used  Substance Use Topics  . Alcohol use: No    Current Outpatient Medications:  .  Calcium Carbonate-Vitamin D (CALCIUM 600+D) 600-400 MG-UNIT per tablet, Take 1 tablet by mouth daily.  , Disp: , Rfl:  .  hydrochlorothiazide (HYDRODIURIL) 25 MG tablet, TAKE 1 TABLET BY MOUTH EVERY DAY, Disp: 90 tablet, Rfl: 1 .  Omega-3 Fatty Acids (FISH OIL PO), Take by mouth., Disp: , Rfl:  .  ramipril (ALTACE) 5 MG capsule, TAKE 1 CAPSULE BY MOUTH EVERY DAY, Disp: 90 capsule, Rfl: 3 .  ciprofloxacin (CIPRO) 250 MG tablet, Take 1 tablet (250 mg total) by mouth 2 (two) times daily for 10 days., Disp: 20 tablet, Rfl: 0  No Known Allergies  Objective:  Temp (!) 97.4 F (36.3 C) (  Oral)   VITALS: Per patient if applicable, see vitals. GENERAL: Alert, appears well and in no acute distress. HEENT: Atraumatic, conjunctiva clear, no obvious abnormalities on inspection of external nose and ears. NECK: Normal movements of the head and neck. CARDIOPULMONARY: No increased WOB. Speaking in clear sentences.  I:E ratio WNL.  MS: Moves all visible extremities without noticeable abnormality. PSYCH: Pleasant and cooperative, well-groomed. Speech normal rate and rhythm. Affect is appropriate. Insight and judgement are appropriate. Attention is focused, linear, and appropriate.  NEURO: CN grossly intact. Oriented as arrived to appointment on time with no prompting. Moves both UE equally.  SKIN: LLE -not weeping as much, does appear erythematous.  Depression screen [facility] 2/9 09/30/2018 09/24/2017 09/18/2016  Decreased Interest 0 0 0  Down, Depressed, Hopeless 0 0 0  PHQ - 2 Score 0 0 0     . COVID-19 Education: The signs and symptoms of COVID-19 were discussed with the patient and how to seek care for testing if needed. The importance of social distancing was discussed today. . Reviewed expectations re: course of current medical issues. . Discussed self-management of symptoms. . Outlined signs and symptoms indicating need for more acute intervention. . Patient verbalized understanding and all questions were answered. Marland Kitchen Health Maintenance issues including appropriate healthy diet, exercise, and smoking avoidance were discussed with patient. . See orders for this visit as documented in the electronic medical record.  Arnette Norris, MD  Records requested if needed. Time spent: 25 minutes, of which >50% was spent in obtaining information about her symptoms, reviewing her previous labs, evaluations, and treatments, counseling her about her condition (please see the discussed topics above), and developing a plan to further investigate it; she had a number of questions which I addressed.   Lab Results  Component Value Date   WBC 6.1 09/30/2018   HGB 13.1 09/30/2018   HCT 39.2 09/30/2018   PLT 397.0 09/30/2018   GLUCOSE 114 (H) 09/30/2018   CHOL 146 09/30/2018   TRIG 158.0 (H) 09/30/2018   HDL 42.90 09/30/2018   LDLDIRECT 151.3 03/15/2013   LDLCALC 71 09/30/2018   ALT 15 09/30/2018   AST 20 09/30/2018   NA  139 09/30/2018   K 4.0 09/30/2018   CL 99 09/30/2018   CREATININE 0.87 09/30/2018   BUN 21 09/30/2018   CO2 30 09/30/2018   TSH 0.71 09/30/2018   INR 1.45 10/16/2012   HGBA1C 6.4 09/30/2018    Lab Results  Component Value Date   TSH 0.71 09/30/2018   Lab Results  Component Value Date   WBC 6.1 09/30/2018   HGB 13.1 09/30/2018   HCT 39.2 09/30/2018   MCV 83.0 09/30/2018   PLT 397.0 09/30/2018   Lab Results  Component Value Date   NA 139 09/30/2018   K 4.0 09/30/2018   CO2 30 09/30/2018   GLUCOSE 114 (H) 09/30/2018   BUN 21 09/30/2018   CREATININE 0.87 09/30/2018   BILITOT 0.6 09/30/2018   ALKPHOS 60 09/30/2018   AST 20 09/30/2018   ALT 15 09/30/2018   PROT 7.7 09/30/2018   ALBUMIN 3.5 09/30/2018   CALCIUM 9.7 09/30/2018   GFR 63.96 09/30/2018   Lab Results  Component Value Date   CHOL 146 09/30/2018   Lab Results  Component Value Date   HDL 42.90 09/30/2018   Lab Results  Component Value Date   LDLCALC 71 09/30/2018   Lab Results  Component Value Date   TRIG 158.0 (H) 09/30/2018   Lab Results  Component Value Date   CHOLHDL 3 09/30/2018   Lab Results  Component Value Date   HGBA1C 6.4 09/30/2018       Assessment & Plan:   Problem List Items Addressed This Visit      Active Problems   Left leg cellulitis - Primary    With persistent edema. Does well on antibiotics but redness, swelling and weeping returns as soon as she stopped taking abx.  It is starting to get worse again.  No fevers, chills, nausea or vomiting. I had already referred her to wound center and she is open to trying a different wound center.  Send in one more round of cipro,, Reviewed supportive care and red flags that should prompt return.  Refer to another wound center.  Call or send my chart message prn if these symptoms worsen or fail to improve as anticipated. The patient indicates understanding of these issues and agrees with the plan.       Relevant Orders    Ambulatory referral to Wound Clinic      I have discontinued Bernestine Ranta's doxycycline. I am also having her start on ciprofloxacin. Additionally, I am having her maintain her Calcium Carbonate-Vitamin D, Omega-3 Fatty Acids (FISH OIL PO), ramipril, and hydrochlorothiazide.  Meds ordered this encounter  Medications  . ciprofloxacin (CIPRO) 250 MG tablet    Sig: Take 1 tablet (250 mg total) by mouth 2 (two) times daily for 10 days.    Dispense:  20 tablet    Refill:  0     Arnette Norris, MD

## 2019-04-13 ENCOUNTER — Telehealth (INDEPENDENT_AMBULATORY_CARE_PROVIDER_SITE_OTHER): Payer: Medicare Other | Admitting: Family Medicine

## 2019-04-13 ENCOUNTER — Encounter: Payer: Self-pay | Admitting: Family Medicine

## 2019-04-13 ENCOUNTER — Other Ambulatory Visit: Payer: Self-pay

## 2019-04-13 ENCOUNTER — Ambulatory Visit
Admission: RE | Admit: 2019-04-13 | Discharge: 2019-04-13 | Disposition: A | Discharge status: Home / Self Care | Payer: Medicare Other | Source: Ambulatory Visit | Attending: Family Medicine | Admitting: Family Medicine

## 2019-04-13 VITALS — Temp 97.4°F

## 2019-04-13 DIAGNOSIS — L03116 Cellulitis of left lower limb: Secondary | ICD-10-CM

## 2019-04-13 DIAGNOSIS — Z1231 Encounter for screening mammogram for malignant neoplasm of breast: Secondary | ICD-10-CM | POA: Diagnosis not present

## 2019-04-13 MED ORDER — CIPROFLOXACIN HCL 250 MG PO TABS: 250.0000 mg | ORAL_TABLET | Freq: Two times a day (BID) | ORAL | 0 refills | Status: AC

## 2019-04-13 NOTE — Assessment & Plan Note (Addendum)
With persistent edema. Does well on antibiotics but redness, swelling and weeping returns as soon as she stopped taking abx.  It is starting to get worse again.  No fevers, chills, nausea or vomiting. I had already referred her to wound center and she is open to trying a different wound center.  Send in one more round of cipro,, Reviewed supportive care and red flags that should prompt return.  Refer to another wound center.  Call or send my chart message prn if these symptoms worsen or fail to improve as anticipated. The patient indicates understanding of these issues and agrees with the plan.

## 2019-04-14 ENCOUNTER — Telehealth: Payer: Self-pay | Admitting: Family Medicine

## 2019-04-14 NOTE — Telephone Encounter (Signed)
TB-plz help! This pt referral to wound care/someone called stating that they cannot see pt because wound is not open and weeping  Her WHOLE lower leg is weeping/she has areas that are open on her ankle/she has has 3 wound cultures and been on antibiotics for them and still not healing! plz help/thx dmf

## 2019-04-14 NOTE — Telephone Encounter (Signed)
[facility] called and stated that a referral was put in for patient. They will not be able to see her due to the wound not being open and weeping.

## 2019-04-23 ENCOUNTER — Encounter: Payer: Self-pay | Admitting: Family Medicine

## 2019-04-23 ENCOUNTER — Other Ambulatory Visit: Payer: Self-pay

## 2019-04-23 ENCOUNTER — Ambulatory Visit (INDEPENDENT_AMBULATORY_CARE_PROVIDER_SITE_OTHER): Payer: Medicare Other | Admitting: Family Medicine

## 2019-04-23 VITALS — BP 118/74 | HR 88 | Temp 98.7°F | Resp 16 | Ht 63.0 in | Wt 283.0 lb

## 2019-04-23 DIAGNOSIS — I10 Essential (primary) hypertension: Secondary | ICD-10-CM

## 2019-04-23 DIAGNOSIS — M7989 Other specified soft tissue disorders: Secondary | ICD-10-CM

## 2019-04-23 DIAGNOSIS — L03119 Cellulitis of unspecified part of limb: Secondary | ICD-10-CM

## 2019-04-23 DIAGNOSIS — R0989 Other specified symptoms and signs involving the circulatory and respiratory systems: Secondary | ICD-10-CM

## 2019-04-23 NOTE — Progress Notes (Signed)
Subjective:    Patient ID: [name], female    DOB: [birth year], 74 y.o.   MRN: [id number]  HPI This is a 74 yo female who presents today to establish care. Previously saw Dr. Deborra Medina. She is retired. Lives with her husband.   Last CPE- 7/20 Mammo- 04/13/19 Pap- aged out Colonoscopy- 08/27/2013 Tdap-09/18/2016 Flu-12/28/2018 Eye- last year Dental- full dentures Exercise- enjoys swimming, has not been able to do recently due to pandemic then recurrent cellulitis.   Left leg cellulitis- has been on multiple course antibiotics. Saw wound care several times with no improvement. Most recently, 04/13/19, had virtual visit with Dr. Deborra Medina. She was prescribed course of ciprofloxacin 250 mg bid. She had a positive wound culture 02/04/2019 that showed heavy growth of staph aureus and moderate growth of pseudomonas aeruginosa. She notes that redness is not extended as far up her leg since starting antibiotic. Swelling goes down some overnight. She has been applying neosporin and wrapping with guaze due to weeping. Denies pain. She has been doing her normal activities.   Obesity- has decreased her chip and soda intake with 15 pound weight loss over last 3.5 months.    Review of Systems Denies chest pain, SOB, abdominal pain, diarrhea/constipation.     Objective:   Physical Exam Vitals reviewed.  Constitutional:      Appearance: Normal appearance. She is obese.  HENT:     Head: Normocephalic and atraumatic.  Eyes:     Conjunctiva/sclera: Conjunctivae normal.  Cardiovascular:     Rate and Rhythm: Normal rate.  Pulmonary:     Effort: Pulmonary effort is normal.  Musculoskeletal:     Left lower leg: Edema present.     Comments: Left lower leg with significant erythema and swelling, weeping clear fluid. Two areas of slight indentation, white base. Difficult to palpate pulses. Nailbeds pink with good cap refill. Leg wrapped with kerlex and elastic bandage.   Skin:    General: Skin is warm  and dry.  Neurological:     Mental Status: She is alert and oriented to person, place, and time.  Psychiatric:        Mood and Affect: Mood normal.        Behavior: Behavior normal.        Thought Content: Thought content normal.        Judgment: Judgment normal.       BP 118/74 (BP Location: Left Arm, Patient Position: Sitting, Cuff Size: Normal)   Pulse 88   Temp 98.7 F (37.1 C) (Temporal)   Resp 16   Ht 5\' 3"  (1.6 m)   Wt 283 lb (128.4 kg)   SpO2 98%   BMI 50.13 kg/m  Wt Readings from Last 3 Encounters:  04/23/19 283 lb (128.4 kg)  02/10/19 294 lb 6.4 oz (133.5 kg)  12/28/18 298 lb 12.8 oz (135.5 kg)       Assessment & Plan:  1. Recurrent cellulitis of lower extremity - she has had some improvement on antibiotic but this is a recurrent problem and ? Vascular insufficiency. Unfortunately, there are no records from the wound center to review today - she was instructed to stop using neosporin, keep wrapped for comfort - Ambulatory referral to Vascular Surgery  2. Decreased pedal pulses - Ambulatory referral to Vascular Surgery  3. Leg swelling - Ambulatory referral to Vascular Surgery  4. OBESITY, MORBID - has been making dietary changes. She was encouraged to continue.   5. Essential hypertension - well controlled  on HCTZ/ ramipril.   Follow up on regular schedule for CPE/ labs  This visit occurred during the SARS-CoV-2 public health emergency.  Safety protocols were in place, including screening questions prior to the visit, additional usage of staff PPE, and extensive cleaning of exam room while observing appropriate contact time as indicated for disinfecting solutions.      Clarene Reamer, FNP-BC  [facility], [facility]  04/26/2019 8:21 AM

## 2019-04-26 ENCOUNTER — Encounter: Payer: Self-pay | Admitting: Family Medicine

## 2019-04-28 ENCOUNTER — Encounter: Payer: Self-pay | Admitting: Family Medicine

## 2019-04-28 NOTE — Telephone Encounter (Signed)
Patient is checking on her referral. Please review when possible.

## 2019-05-04 ENCOUNTER — Encounter: Payer: Self-pay | Admitting: Family Medicine

## 2019-05-05 NOTE — Telephone Encounter (Signed)
[facility] has only one scheduler and one referral coordinator who is out.  It usually doesn't take long for them to call pt.  I LMOM and sent staff msg for their office to call me.  I can call [facility] if ok w/pt?

## 2019-05-10 ENCOUNTER — Ambulatory Visit (HOSPITAL_COMMUNITY)
Admission: RE | Admit: 2019-05-10 | Discharge: 2019-05-10 | Disposition: A | Discharge status: Home / Self Care | Payer: Medicare Other | Source: Ambulatory Visit | Attending: Surgery | Admitting: Surgery

## 2019-05-10 ENCOUNTER — Other Ambulatory Visit: Payer: Self-pay

## 2019-05-10 DIAGNOSIS — M7989 Other specified soft tissue disorders: Secondary | ICD-10-CM

## 2019-05-11 ENCOUNTER — Encounter: Payer: Self-pay | Admitting: Vascular Surgery

## 2019-05-11 ENCOUNTER — Ambulatory Visit (INDEPENDENT_AMBULATORY_CARE_PROVIDER_SITE_OTHER): Payer: Medicare Other | Admitting: Vascular Surgery

## 2019-05-11 VITALS — BP 115/72 | HR 81 | Temp 97.4°F | Resp 20 | Ht 63.0 in | Wt 279.8 lb

## 2019-05-11 DIAGNOSIS — M7989 Other specified soft tissue disorders: Secondary | ICD-10-CM

## 2019-05-11 NOTE — Progress Notes (Signed)
[facility]  Patient name: [name] MRN: [id number] DOB: 13-Dec-1945 Sex: female  REASON FOR CONSULT: Evaluation of chronic swelling and cellulitis left lower extremity  HPI: [name] is a 74 y.o. female, who is here today for evaluation.  She has had progressive swelling in both lower extremities but this is markedly worse in her left.  She has had recurrent episodes of cellulitis and is developed opening in her skin with weeping and a venous ulcer on the medial aspect of her ankle below her medial malleolus.  She has had several courses of antibiotics which improved this for a brief period of time.  She reports that she has never had any compression type treatment.  He has no history of DVT she is morbidly obese with a BMI of 50  Past Medical History:  Diagnosis Date  . Arthritis    "both knees" (07/07/2012)  . Hypertension   . PONV (postoperative nausea and vomiting)     Family History  Problem Relation Age of Onset  . Colon cancer Mother   . Colon cancer Father     SOCIAL HISTORY: Social History   Socioeconomic History  . Marital status: Married    Spouse name: Not on file  . Number of children: Not on file  . Years of education: Not on file  . Highest education level: Not on file  Occupational History  . Not on file  Tobacco Use  . Smoking status: Never Smoker  . Smokeless tobacco: Never Used  Substance and Sexual Activity  . Alcohol use: No  . Drug use: No  . Sexual activity: Not Currently  Other Topics Concern  . Not on file  Social History Narrative   Desires CPR.   Would want life support but not for prolonged period of time.   Does not want feeding tubes.   Has discussed this with her husband, [name].   Social Determinants of Health   Financial Resource Strain:   . Difficulty of Paying Living Expenses: Not on file  Food Insecurity:   . Worried About Sales executive in the Last Year: Not on file  . Ran Out of Food in the Last Year: Not on file  Transportation Needs:   . Lack of Transportation (Medical): Not on file  . Lack of Transportation (Non-Medical): Not on file  Physical Activity:   . Days of Exercise per Week: Not on file  . Minutes of Exercise per Session: Not on file  Stress:   . Feeling of Stress : Not on file  Social Connections:   . Frequency of Communication with Friends and Family: Not on file  . Frequency of Social Gatherings with Friends and Family: Not on file  . Attends Religious Services: Not on file  . Active Member of Clubs or Organizations: Not on file  . Attends Archivist Meetings: Not on file  . Marital Status: Not on file  Intimate Partner Violence:   . Fear of Current or Ex-Partner: Not on file  . Emotionally Abused: Not on file  . Physically Abused: Not on file  . Sexually Abused: Not on file    No Known Allergies  Current Outpatient Medications  Medication Sig Dispense Refill  . Calcium Carbonate-Vitamin D (CALCIUM 600+D) 600-400 MG-UNIT per tablet Take 1 tablet by mouth daily.      . hydrochlorothiazide (HYDRODIURIL) 25 MG tablet TAKE 1 TABLET BY MOUTH EVERY DAY 90 tablet 1  .  Omega-3 Fatty Acids (FISH OIL PO) Take by mouth.    . ramipril (ALTACE) 5 MG capsule TAKE 1 CAPSULE BY MOUTH EVERY DAY 90 capsule 3   No current facility-administered medications for this visit.    REVIEW OF SYSTEMS:  [X]  denotes positive finding, [ ]  denotes negative finding Cardiac  Comments:  Chest pain or chest pressure:    Shortness of breath upon exertion:    Short of breath when lying flat:    Irregular heart rhythm:        Vascular    Pain in calf, thigh, or hip brought on by ambulation:    Pain in feet at night that wakes you up from your sleep:     Blood clot in your veins:    Leg swelling:  x       Pulmonary    Oxygen at home:    Productive cough:     Wheezing:         Neurologic    Sudden  weakness in arms or legs:     Sudden numbness in arms or legs:     Sudden onset of difficulty speaking or slurred speech:    Temporary loss of vision in one eye:     Problems with dizziness:         Gastrointestinal    Blood in stool:     Vomited blood:         Genitourinary    Burning when urinating:     Blood in urine:        Psychiatric    Major depression:         Hematologic    Bleeding problems:    Problems with blood clotting too easily:        Skin    Rashes or ulcers:        Constitutional    Fever or chills:      PHYSICAL EXAM: Vitals:   05/11/19 0855  BP: 115/72  Pulse: 81  Resp: 20  Temp: (!) 97.4 F (36.3 C)  SpO2: 98%  Weight: 279 lb 12.8 oz (126.9 kg)  Height: 5\' 3"  (1.6 m)    GENERAL: The patient is a well-nourished female, in no acute distress. The vital signs are documented above. CARDIOVASCULAR: Plus radial pulses bilaterally.  Easily palpable right posterior tibial pulse.  I do not palpate pulses in her left foot.  She has biphasic signal at the posterior tibial and monophasic to biphasic at the dorsalis pedis on the left. PULMONARY: There is good air exchange  ABDOMEN: Soft and non-tender  MUSCULOSKELETAL: There are no major deformities or cyanosis. NEUROLOGIC: No focal weakness or paresthesias are detected. SKIN: Extensive changes of cellulitis and weeping from her knee distally onto her ankle.  No involvement of her foot or toes. PSYCHIATRIC: The patient has a normal affect.  DATA:  No evidence of DVT.  She does have enlargement in her great saphenous vein bilaterally but no evidence of reflux in her great saphenous vein  MEDICAL ISSUES: I discussed the significance of this in detail with the patient.  I did explain the her ongoing issues with wounds are related to the swelling and that the swelling has to be controlled in order to control the recurrent cellulitis.  She would not be able to wear compressions both due to her size and also  the excoriation over the entire area below her knee.  I discussed option of Unna boot versus medicated wraps to be changed  more frequently.  She is not interested in a weekly change Unna boot.  I explained that she is at moderate risk for limb loss due to normal arterial flow bilaterally.  Also explained need to refer her to wound care for chronic management of this.  She reports that she did see [facility] several months ago and had a bad experience there that she did not wish to elaborate upon.  She does live halfway between [place] in [place].  We will refer her to wound center and [place].  She was reassured with this discussion.  We did wrap her today with Silvadene then Kerlix then Ace wrap for compression until she is able to see the wound center   Rosetta Posner, MD [facility] Tel [phone] Pager [phone]

## 2019-05-13 ENCOUNTER — Ambulatory Visit: Payer: Medicare Other | Admitting: Physician Assistant

## 2019-05-17 ENCOUNTER — Encounter: Payer: Medicare Other | Attending: Physician Assistant | Admitting: Physician Assistant

## 2019-05-17 ENCOUNTER — Other Ambulatory Visit: Payer: Self-pay

## 2019-05-17 DIAGNOSIS — L97322 Non-pressure chronic ulcer of left ankle with fat layer exposed: Secondary | ICD-10-CM | POA: Diagnosis not present

## 2019-05-17 DIAGNOSIS — L97822 Non-pressure chronic ulcer of other part of left lower leg with fat layer exposed: Secondary | ICD-10-CM | POA: Diagnosis not present

## 2019-05-17 DIAGNOSIS — I890 Lymphedema, not elsewhere classified: Secondary | ICD-10-CM | POA: Insufficient documentation

## 2019-05-17 DIAGNOSIS — L97821 Non-pressure chronic ulcer of other part of left lower leg limited to breakdown of skin: Secondary | ICD-10-CM | POA: Insufficient documentation

## 2019-05-17 DIAGNOSIS — I10 Essential (primary) hypertension: Secondary | ICD-10-CM | POA: Diagnosis not present

## 2019-05-17 NOTE — Progress Notes (Addendum)
MIESHIA, REFFITT ([id number]) Visit Report for 05/17/2019 Chief Complaint Document Details Patient Name: [name], [name]. Date of Service: 05/17/2019 2:15 PM Medical Record Number: [id number] Patient Account Number: [id number] Date of Birth/Sex: [birth year] (74 y.o. F) Treating RN: Cornell Barman Primary Care Provider: Clarene Reamer Other Clinician: Referring Provider: EARLY, TODD Treating Provider/Extender: Melburn Hake, Shayann Garbutt Weeks in Treatment: 0 Information Obtained from: Patient Chief Complaint Left LE and ankle ulcers Electronic Signature(s) Signed: 05/17/2019 3:09:17 PM By: Worthy Keeler PA-C Entered By: Worthy Keeler on 05/17/2019 15:09:16 Seawright, [name] ([id number]) -------------------------------------------------------------------------------- HPI Details Patient Name: [name] Date of Service: 05/17/2019 2:15 PM Medical Record Number: [id number] Patient Account Number: [id number] Date of Birth/Sex: [birth year] (74 y.o. F) Treating RN: Cornell Barman Primary Care Provider: Clarene Reamer Other Clinician: Referring Provider: EARLY, TODD Treating Provider/Extender: Melburn Hake, Shantoria Ellwood Weeks in Treatment: 0 History of Present Illness HPI Description: 05/17/2019 upon evaluation today patient presents today for initial evaluation here in the clinic regarding her left lower extremity open wound secondary to lymphedema. She has previously been seen at the [facility] clinic 1 time in July 2020 but did not have any follow-up after that point. Subsequently she has not been seen here in [place] prior to today. Currently she tells me that she has been having issues for the past 1-2 months with wounds over the left lower extremity 2 that are more distinct she did has a breakdown of an area which is much more superficial as well on her leg but in general she mainly is dealing with what I would term stage III lymphedema. Fortunately there is no signs of active cellulitis she does  have erythema but I think this is more stasis dermatitis than anything infectious in nature. Fortunately there is no evidence of systemic infection which is also good news. She does have a history of lymphedema though she is never worn compression stockings she tells me. She does have hypertension as well. Electronic Signature(s) Signed: 05/17/2019 3:22:14 PM By: Worthy Keeler PA-C Entered By: Worthy Keeler on 05/17/2019 15:22:14 BLESSIN, GEERTS ([id number]) -------------------------------------------------------------------------------- Physical Exam Details Patient Name: [name]. Date of Service: 05/17/2019 2:15 PM Medical Record Number: [id number] Patient Account Number: [id number] Date of Birth/Sex: 1945-05-27 (74 y.o. F) Treating RN: Cornell Barman Primary Care Provider: Clarene Reamer Other Clinician: Referring Provider: EARLY, TODD Treating Provider/Extender: STONE III, Skyllar Notarianni Weeks in Treatment: 0 Constitutional patient is hypertensive.. pulse regular and within target range for patient.Marland Kitchen respirations regular, non-labored and within target range for patient.Marland Kitchen temperature within target range for patient.. Obese and well-hydrated in no acute distress. Eyes conjunctiva clear no eyelid edema noted. pupils equal round and reactive to light and accommodation. Ears, Nose, Mouth, and Throat no gross abnormality of ear auricles or external auditory canals. normal hearing noted during conversation. mucus membranes moist. Respiratory normal breathing without difficulty. Cardiovascular 1+ dorsalis pedis/posterior tibialis pulses. Patient has stage III lymphedema noted upon evaluation.. Gastrointestinal (GI) soft, non-tender, non-distended, +BS. no ventral hernia noted. Musculoskeletal normal gait and posture. no significant deformity or arthritic changes, no loss or range of motion, no clubbing. Psychiatric this patient is able to make decisions and demonstrates good insight into  disease process. Alert and Oriented x 3. pleasant and cooperative. Notes Upon inspection patient's wounds actually appear to be minimally slough covered with slight biofilm I was able to mechanically debride this away today she tolerated that without complication. Fortunately there is no signs of infection which is good news and overall  I do feel like she is going to do quite well as far as trying to get the swelling under control and subsequently getting the wounds closed here. Electronic Signature(s) Signed: 05/17/2019 3:23:28 PM By: Worthy Keeler PA-C Entered By: Worthy Keeler on 05/17/2019 15:23:27 [name], BRITTAN ([id number]) -------------------------------------------------------------------------------- Physician Orders Details Patient Name: [name] Date of Service: 05/17/2019 2:15 PM Medical Record Number: [id number] Patient Account Number: [id number] Date of Birth/Sex: April 04, 1945 (74 y.o. 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 Wound #2 Left,Medial Ankle o Cleanse wound with mild soap and water - in office o May shower with protection. Wound #3 Left,Circumferential Lower Leg o Cleanse wound with mild soap and water - in office o May shower with protection. Primary Wound  Dressing Wound #1 Left,Medial Lower Leg o Silver Alginate Wound #2 Left,Medial Ankle o Silver Alginate Wound #3 Left,Circumferential Lower Leg o Silver Alginate Secondary Dressing Wound #1 Left,Medial Lower Leg o XtraSorb Wound #2 Left,Medial Ankle o XtraSorb Wound #3 Left,Circumferential Lower Leg o XtraSorb Dressing Change Frequency Wound #1 Left,Medial Lower Leg o Change dressing every week - Nurse visit as needed Wound #2 Left,Medial Ankle o Change dressing every week - Nurse visit as needed Wound #3 Left,Circumferential Lower Leg o Change dressing every week - Nurse visit as needed Follow-up Appointments AVALIE, [place]. ([id number]) Wound #1 Left,Medial Lower Leg o Return Appointment in 1 week. Wound #2 Left,Medial Ankle o Return Appointment in 1 week. Wound #3 Left,Circumferential Lower Leg o Return Appointment in 1 week. Edema Control Wound #1 Left,Medial Lower Leg o 3 Layer Compression System - Left Lower Extremity Wound #2 Left,Medial Ankle o 3 Layer Compression System - Left Lower Extremity Wound #3 Left,Circumferential Lower Leg o 3 Layer Compression System - Left Lower Extremity Electronic Signature(s) Signed: 05/17/2019 4:40:14 PM By: Army Melia Signed: 05/17/2019 5:29:33 PM By: Worthy Keeler PA-C Entered By: Army Melia on 05/17/2019 15:19:34 Lehr, [name] ([id number]) -------------------------------------------------------------------------------- Problem List Details Patient Name: [name]. Date of Service: 05/17/2019 2:15 PM Medical Record Number: [id number] Patient Account Number: [id number] Date of Birth/Sex: 1946/01/19 (74 y.o. F) Treating RN: Cornell Barman Primary Care Provider: Clarene Reamer Other Clinician: Referring Provider: EARLY, TODD Treating Provider/Extender: Melburn Hake, Lindsay Straka Weeks in Treatment: 0 Active Problems ICD-10 Evaluated Encounter Code Description Active Date Today Diagnosis I89.0  Lymphedema, not elsewhere classified 05/17/2019 No Yes L97.822 Non-pressure chronic ulcer of other part of left lower leg with fat layer 05/17/2019 No Yes exposed L97.322 Non-pressure chronic ulcer of left ankle with fat layer exposed 05/17/2019 No Yes L97.821 Non-pressure chronic ulcer of other part of left lower leg limited to 05/17/2019 No Yes breakdown of skin I10 Essential (primary) hypertension 05/17/2019 No Yes Inactive Problems Resolved Problems Electronic Signature(s) Signed: 05/17/2019 3:08:58 PM By: Worthy Keeler PA-C Entered By: Worthy Keeler on 05/17/2019 15:08:58 Suit, Aleaha C. ([id number]) -------------------------------------------------------------------------------- Progress Note Details  Patient Name: EIRINI, MCCLURKIN. Date of Service: 05/17/2019 2:15 PM Medical Record Number: [id number] Patient Account Number: [id number] Date of Birth/Sex: May 28, 1945 (74 y.o. F) Treating RN: Cornell Barman Primary Care Provider: Clarene Reamer Other Clinician: Referring Provider: EARLY, TODD Treating Provider/Extender: Melburn Hake, Armando Bukhari Weeks in Treatment: 0 Subjective Chief Complaint Information obtained from Patient Left LE and ankle ulcers History of Present Illness (HPI) 05/17/2019 upon evaluation today patient presents today for initial evaluation here in the clinic regarding her left lower extremity open wound secondary to lymphedema. She has previously been seen at the [facility] clinic 1 time in July 2020 but did not have any follow-up after that point. Subsequently she has not been seen here in [place] prior to today. Currently she tells me that she has been having issues for the past 1-2 months with wounds over the left lower extremity 2 that are more distinct she did has a breakdown of an area which is much more superficial as well on her leg but in general she mainly is dealing with what I would term stage III lymphedema. Fortunately there is no signs of active cellulitis  she does have erythema but I think this is more stasis dermatitis than anything infectious in nature. Fortunately there is no evidence of systemic infection which is also good news. She does have a history of lymphedema though she is never worn compression stockings she tells me. She does have hypertension as well. Patient History Information obtained from Patient. Allergies No Known Drug Allergies Family History Cancer - Father, No family history of Diabetes, Heart Disease, Hereditary Spherocytosis, Hypertension, Kidney Disease, Lung Disease, Seizures, Stroke, Thyroid Problems, Tuberculosis. Social History Never smoker, Marital Status - Married, Alcohol Use - Never, Drug Use - No History, Caffeine Use - Daily. Medical History Cardiovascular Patient has history of Hypertension Denies history of Angina, Arrhythmia, Congestive Heart Failure, Coronary Artery Disease, Deep Vein Thrombosis, Hypotension, Myocardial Infarction, Peripheral Arterial Disease, Peripheral Venous Disease, Phlebitis, Vasculitis Endocrine Denies history of Type I Diabetes, Type II Diabetes Integumentary (Skin) Denies history of History of Burn, History of pressure wounds Medical And Surgical History Notes Endocrine PCP checks A1C yearly Review of Systems (ROS) Constitutional Symptoms (General Health) Denies complaints or symptoms of Fatigue, Fever, Chills, Marked Weight Change. Eyes Denies complaints or symptoms of Dry Eyes, Vision Changes, Glasses / Contacts. Ear/Nose/Mouth/Throat Denies complaints or symptoms of Difficult clearing ears, Sinusitis. Hematologic/Lymphatic Denies complaints or symptoms of Bleeding / Clotting Disorders, Human Immunodeficiency Virus. Respiratory Denies complaints or symptoms of Chronic or frequent coughs, Shortness of Breath. Cardiovascular Denies complaints or symptoms of Chest pain, LE edema. Gastrointestinal Lesiak, Smoaks ([id number]) Denies complaints or symptoms of  Frequent diarrhea, Nausea, Vomiting. Endocrine Denies complaints or symptoms of Hepatitis, Thyroid disease, Polydypsia (Excessive Thirst). Genitourinary Denies complaints or symptoms of Kidney failure/ Dialysis, Incontinence/dribbling. Immunological Denies complaints or symptoms of Hives, Itching. Integumentary (Skin) Complains or has symptoms of Wounds, Swelling. Denies complaints or symptoms of Bleeding or bruising tendency, Breakdown. Musculoskeletal Denies complaints or symptoms of Muscle Pain, Muscle Weakness. Neurologic Denies complaints or symptoms of Numbness/parasthesias, Focal/Weakness. Psychiatric Denies complaints or symptoms of Anxiety, Claustrophobia. Objective Constitutional patient is hypertensive.. pulse regular and within target range for patient.Marland Kitchen respirations regular, non-labored and within target range for patient.Marland Kitchen temperature within target range for patient.. Obese and well-hydrated in no acute distress. Vitals Time Taken: 2:20 PM, Height: 62 in, Source: Stated, Weight: 380 lbs, Source: Measured, BMI: 69.5, Temperature: 98.9 F, Pulse: 83 bpm, Respiratory Rate: 18 breaths/min, Blood  Pressure: 151/77 mmHg. Eyes conjunctiva clear no eyelid edema noted. pupils equal round and reactive to light and accommodation. Ears, Nose, Mouth, and Throat no gross abnormality of ear auricles or external auditory canals. normal hearing noted during conversation. mucus membranes moist. Respiratory normal breathing without difficulty. Cardiovascular 1+ dorsalis pedis/posterior tibialis pulses. Patient has stage III lymphedema noted upon evaluation.. Gastrointestinal (GI) soft, non-tender, non-distended, +BS. no ventral hernia noted. Musculoskeletal normal gait and posture. no significant deformity or arthritic changes, no loss or range of motion, no clubbing. Psychiatric this patient is able to make decisions and demonstrates good insight into disease process. Alert and  Oriented x 3. pleasant and cooperative. General Notes: Upon inspection patient's wounds actually appear to be minimally slough covered with slight biofilm I was able to mechanically debride this away today she tolerated that without complication. Fortunately there is no signs of infection which is good news and overall I do feel like she is going to do quite well as far as trying to get the swelling under control and subsequently getting the wounds closed here. Integumentary (Hair, Skin) Wound #1 status is Open. Original cause of wound was Gradually Appeared. The wound is located on the Left,Medial Lower Leg. The wound measures 0.8cm length x 0.8cm width x 0.3cm depth; 0.503cm^2 area and 0.151cm^3 volume. There is Fat Layer (Subcutaneous Tissue) Exposed exposed. There is no tunneling or undermining noted. There is a medium amount of serous drainage noted. The wound margin is flat and intact. There is small (1-33%) pink granulation within the wound bed. There is a large (67-100%) amount of necrotic tissue within the wound bed including Adherent Slough. Wound #2 status is Open. Original cause of wound was Gradually Appeared. The wound is located on the Left,Medial Ankle. The wound measures 1.6cm length x 1.7cm width x 0.3cm depth; 2.136cm^2 area and 0.641cm^3 volume. There is Fat Layer (Subcutaneous Tissue) Exposed exposed. There is no tunneling or undermining noted. There is a medium amount of serous drainage noted. The wound margin is flat and intact. There is small Husmann, Glenys C. ([id number]) (1-33%) pink granulation within the wound bed. There is a large (67-100%) amount of necrotic tissue within the wound bed including Adherent Slough. 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 There was a Three Layer Compression Therapy Procedure by Army Melia, RN. Post procedure Diagnosis Wound #1: Same as Pre-Procedure Wound #2 Pre-procedure diagnosis of Wound #2 is a Lymphedema located on the Left,Medial Ankle . There was a Three Layer Compression Therapy Procedure by Army Melia, RN. Post procedure Diagnosis Wound #2: Same as Pre-Procedure Plan Wound Cleansing: Wound #1 Left,Medial Lower Leg: Cleanse wound with mild soap and water - in office May shower with protection. Wound #2 Left,Medial Ankle: Cleanse wound with mild soap and water - in office May shower with protection. Wound #3 Left,Circumferential Lower Leg: Cleanse wound with mild soap and water - in office May shower with protection. Primary Wound Dressing: Wound #1 Left,Medial Lower Leg: Silver Alginate Wound #2 Left,Medial Ankle: Silver Alginate Wound #3 Left,Circumferential Lower Leg: Silver Alginate Secondary Dressing: Wound #1 Left,Medial Lower Leg: XtraSorb Wound #2 Left,Medial Ankle: XtraSorb Wound #  3 Left,Circumferential Lower Leg: XtraSorb Dressing Change Frequency: Wound #1 Left,Medial Lower Leg: Change dressing every week - Nurse visit as needed Wound #2 Left,Medial Ankle: Asante, Chalese C. ([id number]) Change dressing every week - Nurse visit as needed Wound #3 Left,Circumferential Lower Leg: Change dressing every week - Nurse visit as needed Follow-up Appointments: Wound #1 Left,Medial Lower Leg: Return Appointment in 1 week. Wound #2 Left,Medial Ankle: Return Appointment in 1 week. Wound #3 Left,Circumferential Lower Leg: Return Appointment in 1 week. Edema Control: Wound #1 Left,Medial Lower Leg: 3 Layer Compression System - Left Lower Extremity Wound #2 Left,Medial Ankle: 3 Layer  Compression System - Left Lower Extremity Wound #3 Left,Circumferential Lower Leg: 3 Layer Compression System - Left Lower Extremity #1. I am going to recommend currently that we go ahead and initiate treatment with a silver alginate dressing to the open wound areas we will use Xtrasorb over top of the weeping areas with regard to lymphedema. #2. I am also going to suggest we initiate a 3 layer compression wrap which I think will be appropriate for her. #3. I would also recommend that we have the patient elevate her legs as much as possible I do think she is getting need some kind of ongoing compression when she heals but again it somewhat difficult due to the fact that she does have a very large calf muscles with smaller area around the ankle this is can make it difficult to find something that is not custom that will fit her appropriately. She may even benefit from a referral to lymphedema clinic to see if they can order her lymphedema wraps that would be more beneficial for her in this regard. We discussed that has become closer to getting her healed. We will see patient back for reevaluation in 1 week here in the clinic. If anything worsens or changes patient will contact our office for additional recommendations. Electronic Signature(s) Signed: 05/19/2019 7:32:16 AM By: Worthy Keeler PA-C Previous Signature: 05/17/2019 3:24:56 PM Version By: Worthy Keeler PA-C Entered By: Worthy Keeler on 05/19/2019 07:32:16 Glotfelty, [name] ([id number]) -------------------------------------------------------------------------------- ROS/PFSH Details Patient Name: [name] Date of Service: 05/17/2019 2:15 PM Medical Record Number: [id number] Patient Account Number: [id number] Date of Birth/Sex: 19-Nov-1945 (74 y.o. F) Treating RN: Montey Hora Primary Care Provider: Clarene Reamer Other Clinician: Referring Provider: EARLY, TODD Treating Provider/Extender: STONE III, Raiven Belizaire Weeks in  Treatment: 0 Information Obtained From Patient Constitutional Symptoms (General Health) Complaints and Symptoms: Negative for: Fatigue; Fever; Chills; Marked Weight Change Eyes Complaints and Symptoms: Negative for: Dry Eyes; Vision Changes; Glasses / Contacts Ear/Nose/Mouth/Throat Complaints and Symptoms: Negative for: Difficult clearing ears; Sinusitis Hematologic/Lymphatic Complaints and Symptoms: Negative for: Bleeding / Clotting Disorders; Human Immunodeficiency Virus Respiratory Complaints and Symptoms: Negative for: Chronic or frequent coughs; Shortness of Breath Cardiovascular Complaints and Symptoms: Negative for: Chest pain; LE edema Medical History: Positive for: Hypertension Negative for: Angina; Arrhythmia; Congestive Heart Failure; Coronary Artery Disease; Deep Vein Thrombosis; Hypotension; Myocardial Infarction; Peripheral Arterial Disease; Peripheral Venous Disease; Phlebitis; Vasculitis Gastrointestinal Complaints and Symptoms: Negative for: Frequent diarrhea; Nausea; Vomiting Endocrine Complaints and Symptoms: Negative for: Hepatitis; Thyroid disease; Polydypsia (Excessive Thirst) Medical History: Negative for: Type I Diabetes; Type II Diabetes Past Medical History Notes: PCP checks A1C yearly Genitourinary Complaints and Symptoms: Negative for: Kidney failure/ Dialysis; Incontinence/dribbling Immunological Hartsfield, Gricel C. ([id number]) Complaints and Symptoms: Negative for: Hives; Itching Integumentary (Skin) Complaints and Symptoms: Positive for: Wounds; Swelling Negative for: Bleeding or bruising tendency; Breakdown Medical History:  Negative for: History of Burn; History of pressure wounds Musculoskeletal Complaints and Symptoms: Negative for: Muscle Pain; Muscle Weakness Neurologic Complaints and Symptoms: Negative for: Numbness/parasthesias; Focal/Weakness Psychiatric Complaints and Symptoms: Negative for: Anxiety;  Claustrophobia Oncologic Immunizations Pneumococcal Vaccine: Received Pneumococcal Vaccination: Yes Immunization Notes: up to date Implantable Devices None Family and Social History Cancer: Yes - Father; Diabetes: No; Heart Disease: No; Hereditary Spherocytosis: No; Hypertension: No; Kidney Disease: No; Lung Disease: No; Seizures: No; Stroke: No; Thyroid Problems: No; Tuberculosis: No; Never smoker; Marital Status - Married; Alcohol Use: Never; Drug Use: No History; Caffeine Use: Daily; Financial Concerns: No; Food, Clothing or Shelter Needs: No; Support System Lacking: No; Transportation Concerns: No Electronic Signature(s) Signed: 05/17/2019 4:53:18 PM By: Montey Hora Signed: 05/17/2019 5:29:33 PM By: Worthy Keeler PA-C Entered By: Montey Hora on 05/17/2019 14:39:04 Crehan, [name] ([id number]) -------------------------------------------------------------------------------- SuperBill Details Patient Name: [name]. Date of Service: 05/17/2019 Medical Record Number: [id number] Patient Account Number: [id number] Date of Birth/Sex: 10-26-1945 (74 y.o. F) Treating RN: Army Melia Primary Care Provider: Clarene Reamer Other Clinician: Referring Provider: EARLY, TODD Treating Provider/Extender: STONE III, Gordana Kewley Weeks in Treatment: 0 Diagnosis Coding ICD-10 Codes Code Description I89.0 Lymphedema, not elsewhere classified L97.822 Non-pressure chronic ulcer of other part of left lower leg with fat layer exposed L97.322 Non-pressure chronic ulcer of left ankle with fat layer exposed L97.821 Non-pressure chronic ulcer of other part of left lower leg limited to breakdown of skin I10 Essential (primary) hypertension Facility Procedures CPT4 Code: [id number] Description: [id number] - WOUND CARE VISIT-LEV 3 EST PT Modifier: Quantity: 1 CPT4 Code: [id number] Description: (Facility Use Only) [id number] - [place] LWR LT LEG Modifier: Quantity: 1 Physician  Procedures CPT4 [id number] Description: [id number] - WC PHYS LEVEL 3 - EST PT Modifier: Quantity: 1 CPT4 Code: Description: ICD-10 Diagnosis Description I89.0 Lymphedema, not elsewhere classified L97.822 Non-pressure chronic ulcer of other part of left lower leg with fat layer ex L97.322 Non-pressure chronic ulcer of left ankle with fat layer exposed L97.821  Non-pressure chronic ulcer of other part of left lower leg limited to breakd Modifier: posed own of skin Quantity: Electronic Signature(s) Signed: 05/17/2019 3:25:13 PM By: Worthy Keeler PA-C Entered By: Worthy Keeler on 05/17/2019 15:25:13

## 2019-05-17 NOTE — Progress Notes (Signed)
[name] Visit Report for 05/17/2019 Abuse/Suicide Risk Screen Details Patient Name: [name], [name]. Date of Service: 05/17/2019 2:15 PM Medical Record Number: [id number] Patient Account Number: [id number] Date of Birth/Sex: 01-14-1946 (74 y.o. F) Treating RN: Montey Hora Primary Care Dynastee Brummell: Clarene Reamer Other Clinician: Referring Stefanos Haynesworth: EARLY, TODD Treating Amberlea Spagnuolo/Extender: STONE III, HOYT Weeks in Treatment: 0 Abuse/Suicide Risk Screen Items Answer ABUSE RISK SCREEN: Has anyone close to you tried to hurt or harm you recentlyo No Do you feel uncomfortable with anyone in your familyo No Has anyone forced you do things that you didnot want to doo No Electronic Signature(s) Signed: 05/17/2019 4:53:18 PM By: Montey Hora Entered By: Montey Hora on 05/17/2019 14:32:19 Longbottom, [name] ([id number]) -------------------------------------------------------------------------------- Activities of Daily Living Details Patient Name: [name], [name] Date of Service: 05/17/2019 2:15 PM Medical Record Number: [id number] Patient Account Number: [id number] Date of Birth/Sex: Aug 23, 1945 (74 y.o. F) Treating RN: Montey Hora Primary Care Fedora Knisely: Clarene Reamer Other Clinician: Referring Lulubelle Simcoe: EARLY, TODD Treating Hezzie Karim/Extender: STONE III, HOYT Weeks in Treatment: 0 Activities of Daily Living Items Answer Activities of Daily Living (Please select one for each item) Drive Automobile Completely Able Take Medications Completely Able Use Telephone Completely Able Care for Appearance Completely Able Use Toilet Completely Able Bath / Shower Completely Able Dress Self Completely Able Feed Self Completely Able Walk Completely Able Get In / Out Bed Completely Able Housework Completely Able Prepare Meals Completely Able Handle Money Completely Able Shop for Self Completely Able Electronic Signature(s) Signed: 05/17/2019 4:53:18 PM By: Montey Hora Entered By: Montey Hora on 05/17/2019 14:32:42 Pousson, [name] ([id number]) -------------------------------------------------------------------------------- Education Screening Details Patient Name: [name]. Date of Service: 05/17/2019 2:15 PM Medical Record Number: [id number] Patient Account Number: [id number] Date of Birth/Sex: 28-Dec-1945 (74 y.o. F) Treating RN: Montey Hora Primary Care Krikor Willet: Clarene Reamer Other Clinician: Referring Olyvia Gopal: EARLY, TODD Treating Jacob Chamblee/Extender: Melburn Hake, HOYT Weeks in Treatment: 0 Primary Learner Assessed: Patient Learning Preferences/Education Level/Primary Language Learning Preference: Explanation, Demonstration Highest Education Level: High School Preferred Language: English Cognitive Barrier Language Barrier: No Translator Needed: No Memory Deficit: No Emotional Barrier: No Cultural/Religious Beliefs Affecting Medical Care: No Physical Barrier Impaired Vision: No Impaired Hearing: No Decreased Hand dexterity: No Knowledge/Comprehension Knowledge Level: Medium Comprehension Level: Medium Ability to understand written instructions: Medium Ability to understand verbal instructions: Medium Motivation Anxiety Level: Calm Cooperation: Cooperative Education Importance: Acknowledges Need Interest in Health Problems: Asks Questions Perception: Coherent Willingness to Engage in Self-Management Medium Activities: Readiness to Engage in Self-Management Medium Activities: Electronic Signature(s) Signed: 05/17/2019 4:53:18 PM By: Montey Hora Entered By: Montey Hora on 05/17/2019 14:33:45 Westendorf, [name] ([id number]) -------------------------------------------------------------------------------- Fall Risk Assessment Details Patient Name: [name]. Date of Service: 05/17/2019 2:15 PM Medical Record Number: [id number] Patient Account Number: [id number] Date of Birth/Sex: [birth year] (74 y.o.  F) Treating RN: Montey Hora Primary Care Alee Gressman: Clarene Reamer Other Clinician: Referring Ezri Landers: EARLY, TODD Treating Kelia Gibbon/Extender: STONE III, HOYT Weeks in Treatment: 0 Fall Risk Assessment Items Have you had 2 or more falls in the last 12 monthso 0 No Have you had any fall that resulted in injury in the last 12 monthso 0 No FALLS RISK SCREEN History of falling - immediate or within 3 months 0 No Secondary diagnosis (Do you have 2 or more medical diagnoseso) 0 No Ambulatory aid None/bed rest/wheelchair/nurse 0 Yes Crutches/cane/walker 0 No Furniture 0 No Intravenous therapy Access/Saline/Heparin Lock 0 No Gait/Transferring Normal/ bed rest/ wheelchair 0 No Weak (short steps  with or without shuffle, stooped but able to lift head while walking, may seek 10 Yes support from furniture) Impaired (short steps with shuffle, may have difficulty arising from chair, head down, impaired 0 No balance) Mental Status Oriented to own ability 0 Yes Electronic Signature(s) Signed: 05/17/2019 4:53:18 PM By: Montey Hora Entered By: Montey Hora on 05/17/2019 14:33:18 Earnhardt, [name] ([id number]) -------------------------------------------------------------------------------- Foot Assessment Details Patient Name: [name]. Date of Service: 05/17/2019 2:15 PM Medical Record Number: [id number] Patient Account Number: [id number] Date of Birth/Sex: 1945-11-19 (74 y.o. F) Treating RN: Montey Hora Primary Care Kameelah Minish: Clarene Reamer Other Clinician: Referring Antiono Ettinger: EARLY, TODD Treating Thadeus Gandolfi/Extender: STONE III, HOYT Weeks in Treatment: 0 Foot Assessment Items Site Locations + = Sensation present, - = Sensation absent, C = Callus, U = Ulcer R = Redness, W = Warmth, M = Maceration, PU = Pre-ulcerative lesion F = Fissure, S = Swelling, D = Dryness Assessment Right: Left: Other Deformity: No No Prior Foot Ulcer: No No Prior Amputation: No No Charcot  Joint: No No Ambulatory Status: Ambulatory Without Help Gait: Steady Electronic Signature(s) Signed: 05/17/2019 4:53:18 PM By: Montey Hora Entered By: Montey Hora on 05/17/2019 14:34:36 Monter, [name] ([id number]) -------------------------------------------------------------------------------- Nutrition Risk Screening Details Patient Name: [name] Date of Service: 05/17/2019 2:15 PM Medical Record Number: [id number] Patient Account Number: [id number] Date of Birth/Sex: 1946/03/01 (74 y.o. F) Treating RN: Montey Hora Primary Care Saher Davee: Clarene Reamer Other Clinician: Referring Braylon Grenda: EARLY, TODD Treating Neveah Bang/Extender: STONE III, HOYT Weeks in Treatment: 0 Height (in): 62 Weight (lbs): 380 Body Mass Index (BMI): 69.5 Nutrition Risk Screening Items Score Screening NUTRITION RISK SCREEN: I have an illness or condition that made me change the kind and/or amount of food I eat 0 No I eat fewer than two meals per day 0 No I eat few fruits and vegetables, or milk products 0 No I have three or more drinks of beer, liquor or wine almost every day 0 No I have tooth or mouth problems that make it hard for me to eat 0 No I don't always have enough money to buy the food I need 0 No I eat alone most of the time 0 No I take three or more different prescribed or over-the-counter drugs a day 1 Yes Without wanting to, I have lost or gained 10 pounds in the last six months 0 No I am not always physically able to shop, cook and/or feed myself 0 No Nutrition Protocols Good Risk Protocol 0 No interventions needed Moderate Risk Protocol High Risk Proctocol Risk Level: Good Risk Score: 1 Electronic Signature(s) Signed: 05/17/2019 4:53:18 PM By: Montey Hora Entered By: Montey Hora on 05/17/2019 14:33:26

## 2019-05-20 ENCOUNTER — Other Ambulatory Visit: Payer: Self-pay

## 2019-05-20 DIAGNOSIS — I890 Lymphedema, not elsewhere classified: Secondary | ICD-10-CM | POA: Diagnosis not present

## 2019-05-20 DIAGNOSIS — L97322 Non-pressure chronic ulcer of left ankle with fat layer exposed: Secondary | ICD-10-CM | POA: Diagnosis not present

## 2019-05-20 DIAGNOSIS — I10 Essential (primary) hypertension: Secondary | ICD-10-CM | POA: Diagnosis not present

## 2019-05-20 DIAGNOSIS — L97821 Non-pressure chronic ulcer of other part of left lower leg limited to breakdown of skin: Secondary | ICD-10-CM | POA: Diagnosis not present

## 2019-05-20 DIAGNOSIS — L97822 Non-pressure chronic ulcer of other part of left lower leg with fat layer exposed: Secondary | ICD-10-CM | POA: Diagnosis not present

## 2019-05-21 NOTE — Progress Notes (Signed)
[name], [name] Visit Report for 05/20/2019 Arrival Information Details Patient Name: [name], [name]. Date of Service: 05/20/2019 9:15 AM Medical Record Number: [id number] Patient Account Number: [id number] Date of Birth/Sex: 11/26/1945 (73 y.o. F) Treating RN: Montey Hora Primary Care [name]: Clarene Reamer Other Clinician: Referring [name]: Clarene Reamer Treating [name]/Extender: Melburn Hake, HOYT Weeks in Treatment: 0 Visit Information History Since Last Visit Added or deleted any medications: No Patient Arrived: Ambulatory Any new allergies or adverse reactions: No Arrival Time: 09:34 Had a fall or experienced change in No Accompanied By: self activities of daily living that may affect Transfer Assistance: None risk of falls: Patient Identification Verified: Yes Signs or symptoms of abuse/neglect since last visito No Secondary Verification Process Completed: Yes Hospitalized since last visit: No Implantable device outside of the clinic excluding No cellular tissue based products placed in the center since last visit: Has Dressing in Place as Prescribed: Yes Has Compression in Place as Prescribed: Yes Pain Present Now: No Notes 98.1 Electronic Signature(s) Signed: 05/20/2019 5:02:05 PM By: Montey Hora Entered By: Montey Hora on 05/20/2019 09:35:17 [name], [name] -------------------------------------------------------------------------------- Compression Therapy Details Patient Name: [name]. Date of Service: 05/20/2019 9:15 AM Medical Record Number: [id number] Patient Account Number: [id number] Date of Birth/Sex: December 05, 1945 (73 y.o. F) Treating RN: Montey Hora Primary Care [name]: Clarene Reamer Other Clinician: Referring [name]: Clarene Reamer Treating Raetta Agostinelli/Extender: Melburn Hake, HOYT Weeks in Treatment: 0 Compression Therapy Performed for Wound Assessment: Wound #1 Left,Medial Lower Leg Performed By:  Clinician Montey Hora, RN Compression Type: Three Layer Pre Treatment ABI: 1.2 Electronic Signature(s) Signed: 05/20/2019 12:07:32 PM By: Montey Hora Entered By: Montey Hora on 05/20/2019 12:07:32 [name] -------------------------------------------------------------------------------- Compression Therapy Details Patient Name: [name]. Date of Service: 05/20/2019 9:15 AM Medical Record Number: [id number] Patient Account Number: [id number] Date of Birth/Sex: 1945/09/03 (73 y.o. F) Treating RN: Montey Hora Primary Care Yeshua Stryker: Clarene Reamer Other Clinician: Referring Zorian Gunderman: Clarene Reamer Treating [name]/Extender: Melburn Hake, HOYT Weeks in Treatment: 0 Compression Therapy Performed for Wound Assessment: Wound #2 Left,Medial Ankle Performed By: Clinician Montey Hora, RN Compression Type: Three Layer Pre Treatment ABI: 1.2 Electronic Signature(s) Signed: 05/20/2019 12:07:32 PM By: Montey Hora Entered By: Montey Hora on 05/20/2019 12:07:32 [name] -------------------------------------------------------------------------------- Encounter Discharge Information Details Patient Name: [name] Date of Service: 05/20/2019 9:15 AM Medical Record Number: [id number] Patient Account Number: [id number] Date of Birth/Sex: [birth year] (73 y.o. F) Treating RN: Montey Hora Primary Care Chrystel Barefield: Clarene Reamer Other Clinician: Referring Corde Antonini: Clarene Reamer Treating [name]/Extender: Melburn Hake, HOYT Weeks in Treatment: 0 Encounter Discharge Information Items Discharge Condition: Stable Ambulatory Status: Ambulatory Discharge Destination: Home Transportation: Private Auto Accompanied By: self Schedule Follow-up Appointment: Yes Clinical Summary of Care: Electronic Signature(s) Signed: 05/20/2019 12:08:33 PM By: Montey Hora Entered By: Montey Hora on 05/20/2019 12:08:32 [name], [name] -------------------------------------------------------------------------------- Wound Assessment Details Patient Name: [name]. Date of Service: 05/20/2019 9:15 AM Medical Record Number: [id number] Patient Account Number: [id number] Date of Birth/Sex: 02/18/1946 (73 y.o. F) Treating RN: Montey Hora Primary Care [name]: Clarene Reamer Other Clinician: Referring [name]: Clarene Reamer Treating [name]/Extender: Melburn Hake, HOYT Weeks in Treatment: 0 Wound Status Wound Number: 1 Primary Etiology: Lymphedema Wound Location: Left Lower Leg - Medial Wound Status: Open Wounding Event: Gradually Appeared Comorbid History: Hypertension Date Acquired: 08/24/2018 Weeks Of Treatment: 0 Clustered Wound: No Wound Measurements Length: (cm) 0.8 Width: (cm) 0.8 Depth: (cm) 0.3 Area: (cm) 0.503 Volume: (cm) 0.151 %  Reduction in Area: 0% % Reduction in Volume: 0% Epithelialization: Medium (34-66%) Tunneling: No Undermining: No Wound Description Classification: Full Thickness Without Exposed Support Struc Wound Margin: Flat and Intact Exudate Amount: Medium Exudate Type: Serous Exudate Color: amber tures Foul Odor After Cleansing: No Slough/Fibrino Yes Wound Bed Granulation Amount: Small (1-33%) Exposed Structure Granulation Quality: Pink Fascia Exposed: No Necrotic Amount: Large (67-100%) Fat Layer (Subcutaneous Tissue) Exposed: Yes Necrotic Quality: Adherent Slough Tendon Exposed: No Muscle Exposed: No Joint Exposed: No Bone Exposed: No Treatment Notes Wound #1 (Left, Medial Lower Leg) Notes silver cell, Xtrasorb, 3 layer Electronic Signature(s) Signed: 05/20/2019 12:06:22 PM By: Montey Hora Entered By: Montey Hora on 05/20/2019 12:06:22 [name], [name] ([id number]) -------------------------------------------------------------------------------- Wound Assessment Details Patient Name: Alyssa Reges C. Date of Service: 05/20/2019 9:15  AM Medical Record Number: [id number] Patient Account Number: [id number] Date of Birth/Sex: 03/26/1945 (73 y.o. F) Treating RN: Montey Hora Primary Care Vallerie Hentz: Clarene Reamer Other Clinician: Referring Claborn Janusz: Clarene Reamer Treating Nohemi Nicklaus/Extender: Melburn Hake, HOYT Weeks in Treatment: 0 Wound Status Wound Number: 2 Primary Etiology: Lymphedema Wound Location: Left Ankle - Medial Wound Status: Open Wounding Event: Gradually Appeared Comorbid History: Hypertension Date Acquired: 08/30/2018 Weeks Of Treatment: 0 Clustered Wound: No Wound Measurements Length: (cm) 1.6 Width: (cm) 1.7 Depth: (cm) 0.3 Area: (cm) 2.136 Volume: (cm) 0.641 % Reduction in Area: 0% % Reduction in Volume: 0% Epithelialization: None Tunneling: No Undermining: No Wound Description Classification: Full Thickness Without Exposed Support Struc Wound Margin: Flat and Intact Exudate Amount: Medium Exudate Type: Serous Exudate Color: amber tures Foul Odor After Cleansing: No Slough/Fibrino Yes Wound Bed Granulation Amount: Small (1-33%) Exposed Structure Granulation Quality: Pink Fascia Exposed: No Necrotic Amount: Large (67-100%) Fat Layer (Subcutaneous Tissue) Exposed: Yes Necrotic Quality: Adherent Slough Tendon Exposed: No Muscle Exposed: No Joint Exposed: No Bone Exposed: No Treatment Notes Wound #2 (Left, Medial Ankle) Notes silver cell, Xtrasorb, 3 layer Electronic Signature(s) Signed: 05/20/2019 12:06:34 PM By: Montey Hora Entered By: Montey Hora on 05/20/2019 12:06:34

## 2019-05-25 ENCOUNTER — Encounter: Payer: Medicare Other | Attending: Physician Assistant | Admitting: Physician Assistant

## 2019-05-25 ENCOUNTER — Other Ambulatory Visit: Payer: Self-pay

## 2019-05-25 DIAGNOSIS — E669 Obesity, unspecified: Secondary | ICD-10-CM | POA: Diagnosis not present

## 2019-05-25 DIAGNOSIS — L97821 Non-pressure chronic ulcer of other part of left lower leg limited to breakdown of skin: Secondary | ICD-10-CM | POA: Diagnosis not present

## 2019-05-25 DIAGNOSIS — Z6841 Body Mass Index (BMI) 40.0 and over, adult: Secondary | ICD-10-CM | POA: Insufficient documentation

## 2019-05-25 DIAGNOSIS — I890 Lymphedema, not elsewhere classified: Secondary | ICD-10-CM | POA: Insufficient documentation

## 2019-05-25 DIAGNOSIS — L97322 Non-pressure chronic ulcer of left ankle with fat layer exposed: Secondary | ICD-10-CM | POA: Insufficient documentation

## 2019-05-25 DIAGNOSIS — L97822 Non-pressure chronic ulcer of other part of left lower leg with fat layer exposed: Secondary | ICD-10-CM | POA: Insufficient documentation

## 2019-05-25 DIAGNOSIS — I10 Essential (primary) hypertension: Secondary | ICD-10-CM | POA: Diagnosis not present

## 2019-05-25 NOTE — Progress Notes (Addendum)
[name], [name] ([id number]) Visit Report for 05/25/2019 Chief Complaint Document Details Patient Name: [name], [name]. Date of Service: 05/25/2019 3:00 PM Medical Record Number: [id number] Patient Account Number: [id number] Date of Birth/Sex: 1945-09-29 (74 y.o. F) Treating RN: Montey Hora Primary Care Provider: Clarene Reamer Other Clinician: Referring Provider: Clarene Reamer Treating Provider/Extender: Melburn Hake, Lailany Enoch Weeks in Treatment: 1 Information Obtained from: Patient Chief Complaint Left LE and ankle ulcers Electronic Signature(s) Signed: 05/25/2019 3:47:08 PM By: Worthy Keeler PA-C Entered By: Worthy Keeler on 05/25/2019 15:47:08 [name], [name] ([id number]) -------------------------------------------------------------------------------- HPI Details Patient Name: [name] Date of Service: 05/25/2019 3:00 PM Medical Record Number: [id number] Patient Account Number: [id number] Date of Birth/Sex: 1945/09/01 (74 y.o. F) Treating RN: Montey Hora Primary Care Provider: Clarene Reamer Other Clinician: Referring Provider: Clarene Reamer Treating Provider/Extender: Melburn Hake, Oaklan Persons Weeks in Treatment: 1 History of Present Illness HPI Description: 05/17/2019 upon evaluation today patient presents today for initial evaluation here in the clinic regarding her left lower extremity open wound secondary to lymphedema. She has previously been seen at the [facility] clinic 1 time in July 2020 but did not have any follow-up after that point. Subsequently she has not been seen here in [place] prior to today. Currently she tells me that she has been having issues for the past 1-2 months with wounds over the left lower extremity 2 that are more distinct she did has a breakdown of an area which is much more superficial as well on her leg but in general she mainly is dealing with what I would term stage III lymphedema. Fortunately there is no signs of active cellulitis she  does have erythema but I think this is more stasis dermatitis than anything infectious in nature. Fortunately there is no evidence of systemic infection which is also good news. She does have a history of lymphedema though she is never worn compression stockings she tells me. She does have hypertension as well. 05/25/2019 upon evaluation today patient appears to be doing about the same with regard to her wounds upon inspection today. Her edema is somewhat better but still with the larger calf muscle area and smaller area around her ankle it is very difficult to wrap her appropriately without the sliding down. Patient really did not feel like it slid down too much nonetheless it did appear to slide quite a bit based on what we were seeing today. Last Thursday was the same. With that being said she still does need compression we did have a discussion between several of the nurses and myself today with the patient about what the best thing to do may be. Electronic Signature(s) Signed: 05/25/2019 5:15:12 PM By: Worthy Keeler PA-C Entered By: Worthy Keeler on 05/25/2019 17:15:12 [name], [name] ([id number]) -------------------------------------------------------------------------------- Physical Exam Details Patient Name: [name]. Date of Service: 05/25/2019 3:00 PM Medical Record Number: [id number] Patient Account Number: [id number] Date of Birth/Sex: [birth year] (74 y.o. F) Treating RN: Montey Hora Primary Care Provider: Clarene Reamer Other Clinician: Referring Provider: Clarene Reamer Treating Provider/Extender: Melburn Hake, Jenny Lai Weeks in Treatment: 1 Constitutional Obese and well-hydrated in no acute distress. Respiratory normal breathing without difficulty. Psychiatric this patient is able to make decisions and demonstrates good insight into disease process. Alert and Oriented x 3. pleasant and cooperative. Notes Upon inspection patient's wound bed actually showed signs of  good improvement with regard to the edema at this time. With that being said she still does have open wounds and were still  trying to treat and manage these areas in general. Nonetheless I do believe she is going require custom compression socks at some point in the future once we get everything closed. Electronic Signature(s) Signed: 05/25/2019 5:15:47 PM By: Worthy Keeler PA-C Entered By: Worthy Keeler on 05/25/2019 17:15:47 [name], [name] -------------------------------------------------------------------------------- Physician Orders Details Patient Name: [name] Date of Service: 05/25/2019 3:00 PM Medical Record Number: [id number] Patient Account Number: [id number] Date of Birth/Sex: 1945-05-03 (74 y.o. 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 Wound #2 Left,Medial Ankle o Cleanse wound with mild soap and water - in office o May shower with protection. Primary Wound Dressing Wound #1 Left,Medial Lower Leg o Silver Alginate Wound #2 Left,Medial Ankle o Silver Alginate Secondary Dressing Wound #1 Left,Medial Lower Leg o ABD pad Wound #2 Left,Medial Ankle o ABD pad Dressing Change Frequency Wound #1 Left,Medial Lower Leg o Change dressing every week -  Nurse visit as needed Wound #2 Left,Medial Ankle o Change dressing every week - Nurse visit as needed Follow-up Appointments Wound #1 Left,Medial Lower Leg o Return Appointment in 1 week. Wound #2 Left,Medial Ankle o Return Appointment in 1 week. Edema Control Wound #1 Left,Medial Lower Leg o Unna Boot to Left Lower Extremity o Elevate legs to the level of the heart and pump ankles as often as possible o Other: - WENDI RIZZARDI, Makaylyn C. ([id number]) Wound #2 Left,Medial Ankle o Unna Boot to Left Lower Extremity o Elevate legs to the level of the heart and pump ankles as often as possible o Other: - Tubi G Electronic Signature(s) Signed: 05/25/2019 4:45:34 PM By: Montey Hora Signed: 05/27/2019 5:37:52 PM By: Worthy Keeler PA-C Entered By: Montey Hora on 05/25/2019 16:04:43 [name], [name] -------------------------------------------------------------------------------- Problem List Details Patient Name: [name]. Date of Service: 05/25/2019 3:00 PM Medical Record Number: [id number] Patient Account Number: [id number] Date of Birth/Sex: 04/29/1945 (74 y.o. F) Treating RN: Montey Hora Primary Care Provider: Clarene Reamer Other Clinician: Referring Provider: Clarene Reamer Treating Provider/Extender: Melburn Hake, Jaylenn Altier Weeks in Treatment: 1 Active Problems ICD-10 Evaluated Encounter Code Description Active Date Today Diagnosis I89.0 Lymphedema, not elsewhere classified 05/17/2019 No Yes L97.822 Non-pressure chronic ulcer of other part of left lower leg with fat layer 05/17/2019 No Yes exposed L97.322 Non-pressure chronic ulcer of left ankle with fat layer exposed 05/17/2019 No Yes L97.821 Non-pressure chronic ulcer of other part of left lower leg limited to 05/17/2019 No Yes breakdown of skin I10 Essential (primary) hypertension 05/17/2019 No Yes Inactive Problems Resolved Problems Electronic Signature(s) Signed: 05/25/2019 3:46:58 PM  By: Worthy Keeler PA-C Entered By: Worthy Keeler on 05/25/2019 15:46:58 [name], [name] -------------------------------------------------------------------------------- Progress Note Details Patient Name: [name]. Date of Service: 05/25/2019 3:00 PM Medical Record Number: [id number] Patient Account Number: [id number] Date of Birth/Sex: Oct 19, 1945 (74 y.o. F) Treating RN: Montey Hora Primary Care Provider:  Clarene Reamer Other Clinician: Referring Provider: Clarene Reamer Treating Provider/Extender: Melburn Hake, Bryttney Netzer Weeks in Treatment: 1 Subjective Chief Complaint Information obtained from Patient Left LE and ankle ulcers History of Present Illness (HPI) 05/17/2019 upon evaluation today patient presents today for initial evaluation here in the clinic regarding her left lower extremity open wound secondary to lymphedema. She has previously been seen at the [facility] clinic 1 time in July 2020 but did not have any follow-up after that point. Subsequently she has not been seen here in [place] prior to today. Currently she tells me that she has been having issues for the past 1-2 months with wounds over the left lower extremity 2 that are more distinct she did has a breakdown of an area which is much more superficial as well on her leg but in general she mainly is dealing with what I would term stage III lymphedema. Fortunately there is no signs of active cellulitis she does have erythema but I think this is more stasis dermatitis than anything infectious in nature. Fortunately there is no evidence of systemic infection which is also good news. She does have a history of lymphedema though she is never worn compression stockings she tells me. She does have hypertension as well. 05/25/2019 upon evaluation today patient appears to be doing about the same with regard to her wounds upon inspection today. Her edema is somewhat better but still with the larger calf  muscle area and smaller area around her ankle it is very difficult to wrap her appropriately without the sliding down. Patient really did not feel like it slid down too much nonetheless it did appear to slide quite a bit based on what we were seeing today. Last Thursday was the same. With that being said she still does need compression we did have a discussion between several of the nurses and myself today with the patient about what the best thing to do may be. Objective Constitutional Obese and well-hydrated in no acute distress. Vitals Time Taken: 3:35 PM, Height: 62 in, Weight: 380 lbs, BMI: 69.5, Temperature: 98.1 F, Pulse: 71 bpm, Respiratory Rate: 18 breaths/min, Blood Pressure: 143/76 mmHg. Respiratory normal breathing without difficulty. Psychiatric this patient is able to make decisions and demonstrates good insight into disease process. Alert and Oriented x 3. pleasant and cooperative. General Notes: Upon inspection patient's wound bed actually showed signs of good improvement with regard to the edema at this time. With that being said she still does have open wounds and were still trying to treat and manage these areas in general. Nonetheless I do believe she is going require custom compression socks at some point in the future once we get everything closed. Integumentary (Hair, Skin) Wound #1 status is Open. Original cause of wound was Gradually Appeared. The wound is located on the Left,Medial Lower Leg. The wound measures 0.8cm length x 0.8cm width x 0.3cm depth; 0.503cm^2 area and 0.151cm^3 volume. There is Fat Layer (Subcutaneous Tissue) Exposed exposed. There is no tunneling or undermining noted. There is a medium amount of serous drainage noted. The wound margin is flat and intact. There is small (1-33%) pink granulation within the wound bed. There is a large (67-100%) amount of necrotic tissue within the wound bed including Adherent Slough. Wound #2 status is Open.  Original cause of wound was Gradually Appeared. The wound is located on the Left,Medial Ankle. The wound measures 1.6cm length x 1.7cm width x 0.3cm depth; 2.136cm^2 area and 0.641cm^3 volume. There is Fat Layer (Subcutaneous  Tissue) Exposed exposed. [name], Alyssa C. ([id number]) There is no tunneling or undermining noted. There is a medium amount of serous drainage noted. The wound margin is flat and intact. There is small (1-33%) pink granulation within the wound bed. There is a large (67-100%) amount of necrotic tissue within the wound bed including Adherent Slough. 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 There was a Haematologist Compression Therapy Procedure with a pre-treatment ABI of 1.2 by Montey Hora, RN. Post procedure Diagnosis Wound #1: Same as Pre-Procedure Wound #2 Pre-procedure diagnosis of Wound #2 is a Lymphedema located on the Left,Medial Ankle . There was a Haematologist Compression Therapy Procedure with a pre-treatment ABI of 1.2 by Montey Hora, RN. Post procedure Diagnosis Wound #2: Same as Pre-Procedure Plan Wound Cleansing: Wound #1 Left,Medial Lower Leg: Cleanse wound with mild soap and water - in office May shower with protection. Wound #2 Left,Medial Ankle: Cleanse wound with mild soap and water - in office May shower with protection. Primary Wound Dressing: Wound #1 Left,Medial Lower Leg: Silver Alginate Wound #2 Left,Medial Ankle: Silver Alginate Secondary Dressing: Wound #1 Left,Medial Lower Leg: ABD pad Wound #2 Left,Medial Ankle: ABD pad Dressing  Change Frequency: Wound #1 Left,Medial Lower Leg: Change dressing every week - Nurse visit as needed Wound #2 Left,Medial Ankle: Change dressing every week - Nurse visit as needed Follow-up Appointments: Wound #1 Left,Medial Lower Leg: Return Appointment in 1 week. Wound #2 Left,Medial Ankle: Return Appointment in 1 week. [name], [name] Edema Control: Wound #1 Left,Medial Lower Leg: Unna Boot to Left Lower Extremity Elevate legs to the level of the heart and pump ankles as often as possible Other: - Tubi G Wound #2 Left,Medial Ankle: Unna Boot to Left Lower Extremity Elevate legs to the level of the heart and pump ankles as often as possible Other: - Tubi G 1. My suggestion at this point is can be that we do try to change up the wrap a little bit here and see what we can do. Were going to use an Unna boot wrap on the lower portion of her leg and Tubigrip on the top portion may be this will do a little bit better for her. 2. I am also going to suggest that we continue with the silver alginate dressing to the 2 wound locations which I think are still open and more distinct at this point. 3. I am also can recommend she continue to try to elevate her legs as much as possible. We will see patient back for reevaluation in 1 week here in the clinic. If anything worsens or changes patient will contact our office for additional recommendations. Electronic Signature(s) Signed: 05/25/2019 5:19:09 PM By: Worthy Keeler PA-C Entered By: Worthy Keeler on 05/25/2019 17:19:08 [name], [name] -------------------------------------------------------------------------------- SuperBill Details Patient Name: [name] Date of Service: 05/25/2019 Medical Record Number: [id number] Patient Account Number: [id number] Date of Birth/Sex: 1945/04/17 (74 y.o. 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.322 Non-pressure chronic ulcer of left ankle with fat layer exposed L97.821 Non-pressure chronic ulcer of other part of left lower leg limited to breakdown of skin I10 Essential (primary) hypertension Facility Procedures CPT4 Code: [id number] Description: (Facility Use Only) [id number] - APPLY UNNA BOOT LT Modifier: Quantity: 1 Physician Procedures CPT4 [id number] Description: [id number] - WC PHYS LEVEL 3 - EST PT Modifier: Quantity: 1 CPT4 Code: Description: ICD-10 Diagnosis Description I89.0 Lymphedema, not elsewhere classified L97.822 Non-pressure chronic ulcer of other part of left lower leg with fat layer ex L97.322 Non-pressure chronic ulcer of left ankle with fat layer exposed L97.821  Non-pressure chronic ulcer of other part of left lower leg limited to breakd Modifier: posed own of skin Quantity: Electronic Signature(s) Signed: 05/25/2019 5:19:25 PM By: Worthy Keeler PA-C Previous Signature: 05/25/2019 4:40:20 PM Version By: Montey Hora Entered By: Worthy Keeler on 05/25/2019 17:19:25

## 2019-05-25 NOTE — Progress Notes (Signed)
[name], [name] ([id number]) Visit Report for 05/25/2019 Arrival Information Details Patient Name: [name], [name]. Date of Service: 05/25/2019 3:00 PM Medical Record Number: [id number] Patient Account Number: [id number] Date of Birth/Sex: 29-May-1945 (74 y.o. F) Treating RN: Army Melia Primary Care Romari Gasparro: Clarene Reamer Other Clinician: Referring Trichelle Lehan: Clarene Reamer Treating Capri Veals/Extender: Melburn Hake, HOYT Weeks in Treatment: 1 Visit Information History Since Last Visit Added or deleted any medications: No Patient Arrived: Ambulatory Any new allergies or adverse reactions: No Arrival Time: 15:38 Had a fall or experienced change in No Accompanied By: self activities of daily living that may affect Transfer Assistance: None risk of falls: Patient Identification Verified: Yes Signs or symptoms of abuse/neglect since last visito No Secondary Verification Process Completed: Yes Hospitalized since last visit: No Implantable device outside of the clinic excluding No cellular tissue based products placed in the center since last visit: Has Dressing in Place as Prescribed: Yes Has Compression in Place as Prescribed: Yes Pain Present Now: No Electronic Signature(s) Signed: 05/25/2019 4:27:58 PM By: Army Melia Entered By: Army Melia on 05/25/2019 15:38:24 [name], [name] ([id number]) -------------------------------------------------------------------------------- Compression Therapy Details Patient Name: [name]. Date of Service: 05/25/2019 3:00 PM Medical Record Number: [id number] Patient Account Number: [id number] Date of Birth/Sex: [birth year] (74 y.o. F) Treating RN: Montey Hora Primary Care Dondre Catalfamo: Clarene Reamer Other Clinician: Referring Taner Rzepka: Clarene Reamer Treating Donelle Baba/Extender: Melburn Hake, HOYT Weeks in Treatment: 1 Compression Therapy Performed for Wound Assessment: Wound #1 Left,Medial Lower Leg Performed By: Clinician Montey Hora, RN Compression Type: Rolena Infante Pre Treatment ABI: 1.2 Post Procedure Diagnosis Same as Pre-procedure Electronic Signature(s) Signed: 05/25/2019 4:45:34 PM By: Montey Hora Entered By: Montey Hora on 05/25/2019 16:03:36 [name], [name] ([id number]) -------------------------------------------------------------------------------- Compression Therapy Details Patient Name: [name] Date of Service: 05/25/2019 3:00 PM Medical Record Number: [id number] Patient Account Number: [id number] Date of Birth/Sex: September 08, 1945 (74 y.o. F) Treating RN: Montey Hora Primary Care Jarriel Papillion: Clarene Reamer Other Clinician: Referring Kamiyah Kindel: Clarene Reamer Treating Dolan Xia/Extender: Melburn Hake, HOYT Weeks in Treatment: 1 Compression Therapy Performed for Wound Assessment: Wound #2 Left,Medial Ankle Performed By: Clinician Montey Hora, RN Compression Type: Rolena Infante Pre Treatment ABI: 1.2 Post Procedure Diagnosis Same as Pre-procedure Electronic Signature(s) Signed: 05/25/2019 4:45:34 PM By: Montey Hora Entered By: Montey Hora on 05/25/2019 16:03:36 [name], [name] ([id number]) -------------------------------------------------------------------------------- Encounter Discharge Information Details Patient Name: [name]. Date of Service: 05/25/2019 3:00 PM Medical Record Number: [id number] Patient Account Number: [id number] Date of Birth/Sex: [birth year] (74 y.o. F) Treating RN: Montey Hora Primary Care Welcome Fults: Clarene Reamer Other Clinician: Referring Nekeisha Aure: Clarene Reamer Treating Lanae Federer/Extender: Sharalyn Ink in Treatment: 1 Encounter Discharge Information Items Discharge Condition: Stable Ambulatory Status: Ambulatory Discharge Destination: Home Transportation: Private Auto Accompanied By: self Schedule Follow-up Appointment: Yes Clinical Summary of Care: Electronic Signature(s) Signed: 05/25/2019 4:41:37 PM By: Montey Hora Entered By: Montey Hora on 05/25/2019 16:41:37 [name], [name] ([id number]) -------------------------------------------------------------------------------- Lower Extremity Assessment Details Patient Name: [name]. Date of Service: 05/25/2019 3:00 PM Medical Record Number: [id number] Patient Account Number: [id number] Date of Birth/Sex: 15-Jun-1945 (74 y.o. F) Treating RN: Army Melia Primary Care Jericca Russett: Clarene Reamer Other Clinician: Referring Vashon Arch: Clarene Reamer Treating Sheila Ocasio/Extender: Melburn Hake, HOYT Weeks in Treatment: 1 Edema Assessment Assessed: [Left: No] [Right: No] Edema: [Left: Ye] [Right: s] Calf Left: Right: Point of Measurement: 32 cm From Medial Instep 46 cm cm Ankle Left: Right: Point of Measurement: 10 cm From Medial Instep 28 cm cm Vascular Assessment  Pulses: Dorsalis Pedis Palpable: [Left:Yes] Electronic Signature(s) Signed: 05/25/2019 4:27:58 PM By: Army Melia Entered By: Army Melia on 05/25/2019 15:45:56 [name], [name] ([id number]) -------------------------------------------------------------------------------- Multi Wound Chart Details Patient Name: [name]. Date of Service: 05/25/2019 3:00 PM Medical Record Number: [id number] Patient Account Number: [id number] Date of Birth/Sex: [birth year] (74 y.o. F) Treating RN: Montey Hora Primary Care Geovannie Vilar: Clarene Reamer Other Clinician: Referring Konstantina Nachreiner: Clarene Reamer Treating Yazhini Mcaulay/Extender: Melburn Hake, HOYT Weeks in Treatment: 1 Vital Signs Height(in): 62 Pulse(bpm): 71 Weight(lbs): 380 Blood Pressure(mmHg): 143/76 Body Mass Index(BMI): 69 Temperature(F): 98.1 Respiratory Rate(breaths/min): 18 Photos: [1:No Photos] [2:No Photos] [N/A:N/A] Wound Location: [1:Left Lower Leg - Medial] [2:Left Ankle - Medial] [N/A:N/A] Wounding Event: [1:Gradually Appeared] [2:Gradually Appeared] [N/A:N/A] Primary Etiology: [1:Lymphedema] [2:Lymphedema]  [N/A:N/A] Comorbid History: [1:Hypertension] [2:Hypertension] [N/A:N/A] Date Acquired: [1:08/24/2018] [2:08/30/2018] [N/A:N/A] Weeks of Treatment: [1:1] [2:1] [N/A:N/A] Wound Status: [1:Open] [2:Open] [N/A:N/A] Measurements L x W x D (cm) [1:0.8x0.8x0.3] [2:1.6x1.7x0.3] [N/A:N/A] Area (cm) : [1:0.503] [2:2.136] [N/A:N/A] Volume (cm) : [1:0.151] [2:0.641] [N/A:N/A] % Reduction in Area: [1:0.00%] [2:0.00%] [N/A:N/A] % Reduction in Volume: [1:0.00%] [2:0.00%] [N/A:N/A] Classification: [1:Full Thickness Without Exposed Support Structures] [2:Full Thickness Without Exposed Support Structures] [N/A:N/A] Exudate Amount: [1:Medium] [2:Medium] [N/A:N/A] Exudate Type: [1:Serous] [2:Serous] [N/A:N/A] Exudate Color: [1:amber] [2:amber] [N/A:N/A] Wound Margin: [1:Flat and Intact] [2:Flat and Intact] [N/A:N/A] Granulation Amount: [1:Small (1-33%)] [2:Small (1-33%)] [N/A:N/A] Granulation Quality: [1:Pink] [2:Pink] [N/A:N/A] Necrotic Amount: [1:Large (67-100%)] [2:Large (67-100%)] [N/A:N/A] Exposed Structures: [1:Fat Layer (Subcutaneous Tissue) Exposed: Yes Fascia: No Tendon: No Muscle: No Joint: No Bone: No Medium (34-66%)] [2:Fat Layer (Subcutaneous Tissue) Exposed: Yes Fascia: No Tendon: No Muscle: No Joint: No Bone: No None] [N/A:N/A N/A] Treatment Notes Electronic Signature(s) Signed: 05/25/2019 4:45:34 PM By: Montey Hora Entered By: Montey Hora on 05/25/2019 16:03:16 [name], [name] ([id number]) -------------------------------------------------------------------------------- Hood Details Patient Name: [name]. Date of Service: 05/25/2019 3:00 PM Medical Record Number: [id number] Patient Account Number: [id number] Date of Birth/Sex: 04/25/1945 (74 y.o. F) Treating RN: Montey Hora Primary Care Camar Guyton: Clarene Reamer Other Clinician: Referring Yanil Dawe: Clarene Reamer Treating Hernandez Losasso/Extender: Melburn Hake, HOYT Weeks in Treatment: 1 Active  Inactive Orientation to the Wound Care Program Nursing Diagnoses: Knowledge deficit related to the wound healing center program Goals: Patient/caregiver will verbalize understanding of the [facility] Program Date Initiated: 05/17/2019 Target Resolution Date: 06/04/2019 Goal Status: Active Interventions: Provide education on orientation to the wound center Notes: Wound/Skin Impairment Nursing Diagnoses: Impaired tissue integrity Goals: Ulcer/skin breakdown will have a volume reduction of 30% by week 4 Date Initiated: 05/17/2019 Target Resolution Date: 06/05/2019 Goal Status: Active Interventions: Assess ulceration(s) every visit Notes: Electronic Signature(s) Signed: 05/25/2019 4:45:34 PM By: Montey Hora Entered By: Montey Hora on 05/25/2019 16:03:09 [name], [name] ([id number]) -------------------------------------------------------------------------------- Pain Assessment Details Patient Name: [name]. Date of Service: 05/25/2019 3:00 PM Medical Record Number: [id number] Patient Account Number: [id number] Date of Birth/Sex: [birth year] (74 y.o. F) Treating RN: Army Melia Primary Care Rexton Greulich: Clarene Reamer Other Clinician: Referring Sarrah Fiorenza: Clarene Reamer Treating Codey Burling/Extender: Melburn Hake, HOYT Weeks in Treatment: 1 Active Problems Location of Pain Severity and Description of Pain Patient Has Paino No Site Locations Pain Management and Medication Current Pain Management: Electronic Signature(s) Signed: 05/25/2019 4:27:58 PM By: Army Melia Entered By: Army Melia on 05/25/2019 15:40:03 [name], [name] ([id number]) -------------------------------------------------------------------------------- Patient/Caregiver Education Details Patient Name: [name]. Date of Service: 05/25/2019 3:00 PM Medical Record Number: [id number] Patient Account Number: [id number] Date of Birth/Gender: [birth year] (74 y.o. F) Treating RN: Montey Hora  Primary Care Physician: Clarene Reamer Other Clinician: Referring Physician: Clarene Reamer Treating Physician/Extender: Sharalyn Ink in Treatment: 1 Education Assessment Education Provided To: Patient Education Topics Provided Venous: Handouts: Other: leg elevation Methods: Demonstration, Explain/Verbal Responses: State content correctly Electronic Signature(s) Signed: 05/25/2019 4:45:34 PM By: Montey Hora Entered By: Montey Hora on 05/25/2019 16:40:42 [name], [name] ([id number]) -------------------------------------------------------------------------------- Wound Assessment Details Patient Name: Alyssa Reges C. Date of Service: 05/25/2019 3:00 PM Medical Record Number: [id number] Patient Account Number: [id number] Date of Birth/Sex: June 17, 1945 (74 y.o. F) Treating RN: Army Melia Primary Care Mylisa Brunson: Clarene Reamer Other Clinician: Referring Charly Hunton: Clarene Reamer Treating Romelo Sciandra/Extender: Melburn Hake, HOYT Weeks in Treatment: 1 Wound Status Wound Number: 1 Primary Etiology: Lymphedema Wound Location: Left Lower Leg - Medial Wound Status: Open Wounding Event: Gradually Appeared Comorbid History: Hypertension Date Acquired: 08/24/2018 Weeks Of Treatment: 1 Clustered Wound: No Wound Measurements Length: (cm) 0.8 Width: (cm) 0.8 Depth: (cm) 0.3 Area: (cm) 0.503 Volume: (cm) 0.151 % Reduction in Area: 0% % Reduction in Volume: 0% Epithelialization: Medium (34-66%) Tunneling: No Undermining: No Wound Description Classification: Full Thickness Without Exposed Support Struct Wound Margin: Flat and Intact Exudate Amount: Medium Exudate Type: Serous Exudate Color: amber ures Foul Odor After Cleansing: No Slough/Fibrino Yes Wound Bed Granulation Amount: Small (1-33%) Exposed Structure Granulation Quality: Pink Fascia Exposed: No Necrotic Amount: Large (67-100%) Fat Layer (Subcutaneous Tissue) Exposed: Yes Necrotic Quality:  Adherent Slough Tendon Exposed: No Muscle Exposed: No Joint Exposed: No Bone Exposed: No Treatment Notes Wound #1 (Left, Medial Lower Leg) Notes silver cell, abd, unna boot with TubiGrip G at the top Electronic Signature(s) Signed: 05/25/2019 4:27:58 PM By: Army Melia Signed: 05/25/2019 4:45:34 PM By: Montey Hora Entered By: Montey Hora on 05/25/2019 16:02:43 [name], [name] ([id number]) -------------------------------------------------------------------------------- Wound Assessment Details Patient Name: Alyssa Reges C. Date of Service: 05/25/2019 3:00 PM Medical Record Number: [id number] Patient Account Number: [id number] Date of Birth/Sex: June 30, 1945 (74 y.o. F) Treating RN: Army Melia Primary Care Nirali Magouirk: Clarene Reamer Other Clinician: Referring Terri Rorrer: Clarene Reamer Treating Delson Dulworth/Extender: Melburn Hake, HOYT Weeks in Treatment: 1 Wound Status Wound Number: 2 Primary Etiology: Lymphedema Wound Location: Left Ankle - Medial Wound Status: Open Wounding Event: Gradually Appeared Comorbid History: Hypertension Date Acquired: 08/30/2018 Weeks Of Treatment: 1 Clustered Wound: No Wound Measurements Length: (cm) 1.6 Width: (cm) 1.7 Depth: (cm) 0.3 Area: (cm) 2.136 Volume: (cm) 0.641 % Reduction in Area: 0% % Reduction in Volume: 0% Epithelialization: None Tunneling: No Undermining: No Wound Description Classification: Full Thickness Without Exposed Support Struct Wound Margin: Flat and Intact Exudate Amount: Medium Exudate Type: Serous Exudate Color: amber ures Foul Odor After Cleansing: No Slough/Fibrino Yes Wound Bed Granulation Amount: Small (1-33%) Exposed Structure Granulation Quality: Pink Fascia Exposed: No Necrotic Amount: Large (67-100%) Fat Layer (Subcutaneous Tissue) Exposed: Yes Necrotic Quality: Adherent Slough Tendon Exposed: No Muscle Exposed: No Joint Exposed: No Bone Exposed: No Treatment Notes Wound #2 (Left, Medial  Ankle) Notes silver cell, abd, unna boot with TubiGrip G at the top Electronic Signature(s) Signed: 05/25/2019 4:27:58 PM By: Army Melia Signed: 05/25/2019 4:45:34 PM By: Montey Hora Entered By: Montey Hora on 05/25/2019 16:03:00 Escher, [name] ([id number]) -------------------------------------------------------------------------------- Vitals Details Patient Name: [name]. Date of Service: 05/25/2019 3:00 PM Medical Record Number: [id number] Patient Account Number: [id number] Date of Birth/Sex: [birth year] (74 y.o. F) Treating RN: Army Melia Primary Care Yakir Wenke: Clarene Reamer Other Clinician: Referring Lowell Makara: Clarene Reamer Treating Rithvik Orcutt/Extender: Melburn Hake, HOYT Weeks in Treatment: 1 Vital Signs Time Taken: 15:35  Temperature (F): 98.1 Height (in): 62 Pulse (bpm): 71 Weight (lbs): 380 Respiratory Rate (breaths/min): 18 Body Mass Index (BMI): 69.5 Blood Pressure (mmHg): 143/76 Reference Range: 80 - 120 mg / dl Electronic Signature(s) Signed: 05/25/2019 4:27:58 PM By: Army Melia Entered By: Army Melia on 05/25/2019 15:39:45

## 2019-05-28 ENCOUNTER — Other Ambulatory Visit: Payer: Self-pay

## 2019-05-28 DIAGNOSIS — I10 Essential (primary) hypertension: Secondary | ICD-10-CM | POA: Diagnosis not present

## 2019-05-28 DIAGNOSIS — L97821 Non-pressure chronic ulcer of other part of left lower leg limited to breakdown of skin: Secondary | ICD-10-CM | POA: Diagnosis not present

## 2019-05-28 DIAGNOSIS — L97322 Non-pressure chronic ulcer of left ankle with fat layer exposed: Secondary | ICD-10-CM | POA: Diagnosis not present

## 2019-05-28 DIAGNOSIS — Z6841 Body Mass Index (BMI) 40.0 and over, adult: Secondary | ICD-10-CM | POA: Diagnosis not present

## 2019-05-28 DIAGNOSIS — L97822 Non-pressure chronic ulcer of other part of left lower leg with fat layer exposed: Secondary | ICD-10-CM | POA: Diagnosis not present

## 2019-05-28 DIAGNOSIS — I890 Lymphedema, not elsewhere classified: Secondary | ICD-10-CM | POA: Diagnosis not present

## 2019-05-28 NOTE — Progress Notes (Signed)
JULEIGH, KAPLA ([id number]) Visit Report for 05/17/2019 Allergy List Details Patient Name: CHENAE, GOODLING. Date of Service: 05/17/2019 2:15 PM Medical Record Number: [id number] Patient Account Number: [id number] Date of Birth/Sex: [birth year] (74 y.o. F) Treating RN: Montey Hora Primary Care Kaiah Hosea: Clarene Reamer Other Clinician: Referring Emelio Schneller: Clarene Reamer Treating Zacharey Jensen/Extender: STONE III, HOYT Weeks in Treatment: 0 Allergies Active Allergies No Known Drug Allergies Allergy Notes Electronic Signature(s) Signed: 05/17/2019 4:53:18 PM By: Montey Hora Entered By: Montey Hora on 05/17/2019 14:30:58 Amano, [name] ([id number]) -------------------------------------------------------------------------------- Arrival Information Details Patient Name: [name] Date of Service: 05/17/2019 2:15 PM Medical Record Number: [id number] Patient Account Number: [id number] Date of Birth/Sex: [birth year] (74 y.o. F) Treating RN: Cornell Barman Primary Care Coretha Creswell: Clarene Reamer Other Clinician: Referring Antoin Dargis: Clarene Reamer Treating Tahni Porchia/Extender: Melburn Hake, HOYT Weeks in Treatment: 0 Visit Information Patient Arrived: Ambulatory Arrival Time: 14:18 Accompanied By: self Transfer Assistance: None Patient Identification Verified: Yes Secondary Verification Process Completed: Yes Electronic Signature(s) Signed: 05/17/2019 4:34:46 PM By: Lorine Bears RCP, RRT, CHT Entered By: Lorine Bears on 05/17/2019 14:19:08 Chavarin, [name] ([id number]) -------------------------------------------------------------------------------- Clinic Level of Care Assessment Details Patient Name: EVILYN, KAZARIAN. Date of Service: 05/17/2019 2:15 PM Medical Record Number: [id number] Patient Account Number: [id number] Date of Birth/Sex: 04/26/1945 (74 y.o. F) Treating RN: Army Melia Primary Care Lemon Sternberg: Clarene Reamer Other  Clinician: Referring Meha Vidrine: Clarene Reamer Treating Lyric Hoar/Extender: Melburn Hake, HOYT Weeks in Treatment: 0 Clinic Level of Care Assessment Items TOOL 1 Quantity Score []  - Use when EandM and Procedure is performed on INITIAL visit 0 ASSESSMENTS - Nursing Assessment / Reassessment X - General Physical Exam (combine w/ comprehensive assessment (listed just below) when performed on new pt. 1 20 evals) X- 1 25 Comprehensive Assessment (HX, ROS, Risk Assessments, Wounds Hx, etc.) ASSESSMENTS - Wound and Skin Assessment / Reassessment []  - Dermatologic / Skin Assessment (not related to wound area) 0 ASSESSMENTS - Ostomy and/or Continence Assessment and Care []  - Incontinence Assessment and Management 0 []  - 0 Ostomy Care Assessment and Management (repouching, etc.) PROCESS - Coordination of Care X - Simple Patient / Family Education for ongoing care 1 15 []  - 0 Complex (extensive) Patient / Family Education for ongoing care X- 1 10 Staff obtains Programmer, systems, Records, Test Results / Process Orders []  - 0 Staff telephones HHA, Nursing Homes / Clarify orders / etc []  - 0 Routine Transfer to another Facility (non-emergent condition) []  - 0 Routine Hospital Admission (non-emergent condition) X- 1 15 New Admissions / Biomedical engineer / Ordering NPWT, Apligraf, etc. []  - 0 Emergency Hospital Admission (emergent condition) PROCESS - Special Needs []  - Pediatric / Minor Patient Management 0 []  - 0 Isolation Patient Management []  - 0 Hearing / Language / Visual special needs []  - 0 Assessment of Community assistance (transportation, D/C planning, etc.) []  - 0 Additional assistance / Altered mentation []  - 0 Support Surface(s) Assessment (bed, cushion, seat, etc.) INTERVENTIONS - Miscellaneous []  - External ear exam 0 []  - 0 Patient Transfer (multiple staff / Civil Service fast streamer / Similar devices) []  - 0 Simple Staple / Suture removal (25 or less) []  - 0 Complex Staple /  Suture removal (26 or more) []  - 0 Hypo/Hyperglycemic Management (do not check if billed separately) Kruse, Margareta C. ([id number]) []  - 0 Ankle / Brachial Index (ABI) - do not check if billed separately Has the patient been seen at the hospital within the last three years: Yes Total Score: 85 Level Of  Care: New/Established - Level 3 Electronic Signature(s) Signed: 05/17/2019 4:40:14 PM By: Army Melia Entered By: Army Melia on 05/17/2019 15:20:14 Rossini, [name] ([id number]) -------------------------------------------------------------------------------- Compression Therapy Details Patient Name: [name] Date of Service: 05/17/2019 2:15 PM Medical Record Number: [id number] Patient Account Number: [id number] Date of Birth/Sex: [birth year] (74 y.o. F) Treating RN: Army Melia Primary Care Curvin Hunger: Clarene Reamer Other Clinician: Referring Yamilka Lopiccolo: Clarene Reamer Treating Lynette Noah/Extender: Melburn Hake, HOYT Weeks in Treatment: 0 Compression Therapy Performed for Wound Assessment: Wound #1 Left,Medial Lower Leg Performed By: Clinician Army Melia, RN Compression Type: Three Layer Post Procedure Diagnosis Same as Pre-procedure Electronic Signature(s) Signed: 05/17/2019 4:40:14 PM By: Army Melia Entered By: Army Melia on 05/17/2019 15:16:56 Ariel, [name] ([id number]) -------------------------------------------------------------------------------- Compression Therapy Details Patient Name: [name] Date of Service: 05/17/2019 2:15 PM Medical Record Number: [id number] Patient Account Number: [id number] Date of Birth/Sex: July 07, 1945 (74 y.o. F) Treating RN: Army Melia Primary Care Sieara Bremer: Clarene Reamer Other Clinician: Referring Dorsie Burich: Clarene Reamer Treating Wellington Winegarden/Extender: Melburn Hake, HOYT Weeks in Treatment: 0 Compression Therapy Performed for Wound Assessment: Wound #2 Left,Medial Ankle Performed By: Clinician Army Melia,  RN Compression Type: Three Layer Post Procedure Diagnosis Same as Pre-procedure Electronic Signature(s) Signed: 05/17/2019 4:40:14 PM By: Army Melia Entered By: Army Melia on 05/17/2019 15:16:57 Tijerina, [name] ([id number]) -------------------------------------------------------------------------------- Encounter Discharge Information Details Patient Name: [name] Date of Service: 05/17/2019 2:15 PM Medical Record Number: [id number] Patient Account Number: [id number] Date of Birth/Sex: [birth year] (74 y.o. F) Treating RN: Army Melia Primary Care Leigh Blas: Clarene Reamer Other Clinician: Referring Alanie Syler: Clarene Reamer Treating Onisha Cedeno/Extender: Melburn Hake, HOYT Weeks in Treatment: 0 Encounter Discharge Information Items Discharge Condition: Stable Ambulatory Status: Ambulatory Discharge Destination: Home Transportation: Private Auto Accompanied By: self Schedule Follow-up Appointment: Yes Clinical Summary of Care: Electronic Signature(s) Signed: 05/17/2019 4:40:14 PM By: Army Melia Entered By: Army Melia on 05/17/2019 15:21:17 Elmendorf, [name] ([id number]) -------------------------------------------------------------------------------- Lower Extremity Assessment Details Patient Name: [name] Date of Service: 05/17/2019 2:15 PM Medical Record Number: [id number] Patient Account Number: [id number] Date of Birth/Sex: [birth year] (74 y.o. F) Treating RN: Montey Hora Primary Care Ilse Billman: Clarene Reamer Other Clinician: Referring Emelynn Rance: Clarene Reamer Treating Tomothy Eddins/Extender: Melburn Hake, HOYT Weeks in Treatment: 0 Edema Assessment Assessed: [Left: No] [Right: No] Edema: [Left: Yes] [Right: Yes] Calf Left: Right: Point of Measurement: 32 cm From Medial Instep 57 cm 52.5 cm Ankle Left: Right: Point of Measurement: 10 cm From Medial Instep 28.5 cm 26.5 cm Vascular Assessment Pulses: Dorsalis Pedis Palpable: [Left:Yes]  [Right:Yes] Doppler Audible: [Left:Yes] [Right:Yes] Posterior Tibial Palpable: [Left:No] [Right:No] Doppler Audible: [Left:Yes] [Right:Yes] Blood Pressure: Brachial: [Left:148] [Right:142] Dorsalis Pedis: 112 Ankle: [Left:Dorsalis Pedis: 170] Posterior Tibial: 130 Ankle Brachial Index: [Left:1.15] [Right:0.88] Electronic Signature(s) Signed: 05/17/2019 4:53:18 PM By: Montey Hora Entered By: Montey Hora on 05/17/2019 15:01:40 Cathey, [name] ([id number]) -------------------------------------------------------------------------------- Multi Wound Chart Details Patient Name: [name] Date of Service: 05/17/2019 2:15 PM Medical Record Number: [id number] Patient Account Number: [id number] Date of Birth/Sex: 05-15-1945 (74 y.o. F) Treating RN: Army Melia Primary Care Laverne Klugh: Clarene Reamer Other Clinician: Referring Charmel Pronovost: Clarene Reamer Treating Nicco Reaume/Extender: Melburn Hake, HOYT Weeks in Treatment: 0 Vital Signs Height(in): 62 Pulse(bpm): 59 Weight(lbs): 380 Blood Pressure(mmHg): 151/77 Body Mass Index(BMI): 69 Temperature(F): 98.9 Respiratory Rate(breaths/min): 18 Photos: Wound Location: Left Lower Leg - Medial Left Ankle - Medial Left Lower Leg - Circumferential Wounding Event: Gradually Appeared Gradually Appeared Gradually Appeared Primary Etiology: Lymphedema Lymphedema Lymphedema Comorbid History: Hypertension Hypertension Hypertension Date Acquired:  08/24/2018 08/30/2018 08/30/2018 Weeks of Treatment: 0 0 0 Wound Status: Open Open Open Measurements L x W x D (cm) 0.8x0.8x0.3 1.6x1.7x0.3 15x29.5x0.1 Area (cm) : 0.503 2.136 347.539 Volume (cm) : 0.151 0.641 34.754 Classification: Full Thickness Without Exposed Full Thickness Without Exposed Partial Thickness Support Structures Support Structures Exudate Amount: Medium Medium Large Exudate Type: Serous Serous Serous Exudate Color: amber amber amber Wound Margin: Flat and Intact Flat and Intact  Flat and Intact Granulation Amount: Small (1-33%) Small (1-33%) Medium (34-66%) Granulation Quality: Pink Pink Pink Necrotic Amount: Large (67-100%) Large (67-100%) Medium (34-66%) Exposed Structures: Fat Layer (Subcutaneous Tissue) Fat Layer (Subcutaneous Tissue) Fascia: No Exposed: Yes Exposed: Yes Fat Layer (Subcutaneous Tissue) Fascia: No Fascia: No Exposed: No Tendon: No Tendon: No Tendon: No Muscle: No Muscle: No Muscle: No Joint: No Joint: No Joint: No Bone: No Bone: No Bone: No Limited to Skin Breakdown Epithelialization: Medium (34-66%) None Medium (34-66%) Laskaris, Estrella C. ([id number]) Treatment Notes Electronic Signature(s) Signed: 05/17/2019 4:40:14 PM By: Army Melia Entered By: Army Melia on 05/17/2019 15:12:58 Schwegel, [name] ([id number]) -------------------------------------------------------------------------------- [place] Details Patient Name: [name] Date of Service: 05/17/2019 2:15 PM Medical Record Number: [id number] Patient Account Number: [id number] Date of Birth/Sex: 31-Oct-1945 (74 y.o. F) Treating RN: Army Melia Primary Care Rye Decoste: Clarene Reamer Other Clinician: Referring Starlena Beil: Clarene Reamer Treating Kedra Mcglade/Extender: Melburn Hake, HOYT Weeks in Treatment: 0 Active Inactive Orientation to the Wound Care Program Nursing Diagnoses: Knowledge deficit related to the wound healing center program Goals: Patient/caregiver will verbalize understanding of the [facility] Program Date Initiated: 05/17/2019 Target Resolution Date: 06/04/2019 Goal Status: Active Interventions: Provide education on orientation to the wound center Notes: Wound/Skin Impairment Nursing Diagnoses: Impaired tissue integrity Goals: Ulcer/skin breakdown will have a volume reduction of 30% by week 4 Date Initiated: 05/17/2019 Target Resolution Date: 06/05/2019 Goal Status: Active Interventions: Assess ulceration(s)  every visit Notes: Electronic Signature(s) Signed: 05/17/2019 4:40:14 PM By: Army Melia Entered By: Army Melia on 05/17/2019 15:12:38 Safranek, [name] ([id number]) -------------------------------------------------------------------------------- Pain Assessment Details Patient Name: [name] Date of Service: 05/17/2019 2:15 PM Medical Record Number: [id number] Patient Account Number: [id number] Date of Birth/Sex: 04-Feb-1946 (74 y.o. F) Treating RN: Cornell Barman Primary Care Zacari Stiff: Clarene Reamer Other Clinician: Referring Jelani Trueba: Clarene Reamer Treating Destyni Hoppel/Extender: Melburn Hake, HOYT Weeks in Treatment: 0 Active Problems Location of Pain Severity and Description of Pain Patient Has Paino No Site Locations Pain Management and Medication Current Pain Management: Electronic Signature(s) Signed: 05/17/2019 4:34:46 PM By: Paulla Fore, RRT, CHT Signed: 05/28/2019 5:40:44 PM By: Gretta Cool, BSN, RN, CWS, Kim RN, BSN Entered By: Lorine Bears on 05/17/2019 14:21:54 LATOSHA, MIEDEMA ([id number]) -------------------------------------------------------------------------------- Patient/Caregiver Education Details Patient Name: [name] Date of Service: 05/17/2019 2:15 PM Medical Record Number: [id number] Patient Account Number: [id number] Date of Birth/Gender: March 19, 1946 (74 y.o. F) Treating RN: Army Melia Primary Care Physician: Clarene Reamer Other Clinician: Referring Physician: Clarene Reamer Treating Physician/Extender: Sharalyn Ink in Treatment: 0 Education Assessment Education Provided To: Patient Education Topics Provided Wound/Skin Impairment: Handouts: Caring for Your Ulcer Methods: Demonstration, Explain/Verbal Responses: State content correctly Electronic Signature(s) Signed: 05/17/2019 4:40:14 PM By: Army Melia Entered By: Army Melia on 05/17/2019 15:20:39 Reynoso, [name]  ([id number]) -------------------------------------------------------------------------------- Wound Assessment Details Patient Name: [name] Date of Service: 05/17/2019 2:15 PM Medical Record Number: [id number] Patient Account Number: [id number] Date of Birth/Sex: 19-Oct-1945 (74 y.o. F) Treating RN: Cornell Barman Primary Care Ryu Cerreta: Clarene Reamer Other Clinician:  Referring Abb Gobert: Clarene Reamer Treating Navaeh Kehres/Extender: Melburn Hake, HOYT Weeks in Treatment: 0 Wound Status Wound Number: 1 Primary Etiology: Lymphedema Wound Location: Left, Medial Lower Leg Wound Status: Open Wounding Event: Gradually Appeared Comorbid History: Hypertension Date Acquired: 08/24/2018 Weeks Of Treatment: 0 Clustered Wound: No Photos Wound Measurements Length: (cm) 0.8 Width: (cm) 0.8 Depth: (cm) 0.3 Area: (cm) 0.503 Volume: (cm) 0.151 % Reduction in Area: 0% % Reduction in Volume: 0% Epithelialization: Medium (34-66%) Tunneling: No Undermining: No Wound Description Classification: Full Thickness Without Exposed Support Structu Wound Margin: Flat and Intact Exudate Amount: Medium Exudate Type: Serous Exudate Color: amber res Foul Odor After Cleansing: No Slough/Fibrino Yes Wound Bed Granulation Amount: Small (1-33%) Exposed Structure Granulation Quality: Pink Fascia Exposed: No Necrotic Amount: Large (67-100%) Fat Layer (Subcutaneous Tissue) Exposed: Yes Necrotic Quality: Adherent Slough Tendon Exposed: No Muscle Exposed: No Joint Exposed: No Bone Exposed: No Electronic Signature(s) Signed: 05/28/2019 5:40:44 PM By: Gretta Cool, BSN, RN, CWS, Kim RN, BSN Entered By: Gretta Cool, BSN, RN, CWS, Kim on 05/17/2019 15:24:24 Totzke, [name] ([id number]) -------------------------------------------------------------------------------- Wound Assessment Details Patient Name: [name] Date of Service: 05/17/2019 2:15 PM Medical Record Number: [id number] Patient Account Number:  [id number] Date of Birth/Sex: [birth year] (75 y.o. F) Treating RN: Cornell Barman Primary Care Bayler Nehring: Clarene Reamer Other Clinician: Referring Letita Prentiss: Clarene Reamer Treating Tecia Cinnamon/Extender: Melburn Hake, HOYT Weeks in Treatment: 0 Wound Status Wound Number: 2 Primary Etiology: Lymphedema Wound Location: Left, Medial Ankle Wound Status: Open Wounding Event: Gradually Appeared Comorbid History: Hypertension Date Acquired: 08/30/2018 Weeks Of Treatment: 0 Clustered Wound: No Photos Wound Measurements Length: (cm) 1.6 Width: (cm) 1.7 Depth: (cm) 0.3 Area: (cm) 2.136 Volume: (cm) 0.641 % Reduction in Area: 0% % Reduction in Volume: 0% Epithelialization: None Tunneling: No Undermining: No Wound Description Classification: Full Thickness Without Exposed Support Structu Wound Margin: Flat and Intact Exudate Amount: Medium Exudate Type: Serous Exudate Color: amber res Foul Odor After Cleansing: No Slough/Fibrino Yes Wound Bed Granulation Amount: Small (1-33%) Exposed Structure Granulation Quality: Pink Fascia Exposed: No Necrotic Amount: Large (67-100%) Fat Layer (Subcutaneous Tissue) Exposed: Yes Necrotic Quality: Adherent Slough Tendon Exposed: No Muscle Exposed: No Joint Exposed: No Bone Exposed: No Electronic Signature(s) Signed: 05/28/2019 5:40:44 PM By: Gretta Cool, BSN, RN, CWS, Kim RN, BSN Entered By: Gretta Cool, BSN, RN, CWS, Kim on 05/17/2019 15:24:25 Maricle, [name] ([id number]) -------------------------------------------------------------------------------- Vitals Details Patient Name: [name] Date of Service: 05/17/2019 2:15 PM Medical Record Number: [id number] Patient Account Number: [id number] Date of Birth/Sex: June 13, 1945 (74 y.o. F) Treating RN: Cornell Barman Primary Care Benett Swoyer: Clarene Reamer Other Clinician: Referring Emilea Goga: Clarene Reamer Treating Patty Lopezgarcia/Extender: Melburn Hake, HOYT Weeks in Treatment: 0 Vital Signs Time Taken:  14:20 Temperature (F): 98.9 Height (in): 62 Pulse (bpm): 83 Source: Stated Respiratory Rate (breaths/min): 18 Weight (lbs): 380 Blood Pressure (mmHg): 151/77 Source: Measured Reference Range: 80 - 120 mg / dl Body Mass Index (BMI): 69.5 Electronic Signature(s) Signed: 05/17/2019 4:34:46 PM By: Lorine Bears RCP, RRT, CHT Entered By: Lorine Bears on 05/17/2019 14:23:10

## 2019-05-31 NOTE — Progress Notes (Signed)
[name], [name] Visit Report for 05/28/2019 Arrival Information Details Patient Name: [name], [name]. Date of Service: 05/28/2019 8:30 AM Medical Record Number: [id number] Patient Account Number: [id number] Date of Birth/Sex: [birth year] (73 y.o. F) Treating RN: Army Melia Primary Care Jaquay Morneault: Clarene Reamer Other Clinician: Referring Tres Grzywacz: Clarene Reamer Treating Lakeyn Dokken/Extender: Melburn Hake, HOYT Weeks in Treatment: 1 Visit Information History Since Last Visit Added or deleted any medications: No Patient Arrived: Ambulatory Any new allergies or adverse reactions: No Arrival Time: 08:29 Had a fall or experienced change in No Accompanied By: self activities of daily living that may affect Transfer Assistance: None risk of falls: Patient Identification Verified: Yes Signs or symptoms of abuse/neglect since last visito No Hospitalized since last visit: No Has Dressing in Place as Prescribed: Yes Pain Present Now: No Electronic Signature(s) Signed: 05/31/2019 8:02:25 AM By: Army Melia Entered By: Army Melia on 05/28/2019 08:42:25 Theriault, [name] -------------------------------------------------------------------------------- Compression Therapy Details Patient Name: [name]. Date of Service: 05/28/2019 8:30 AM Medical Record Number: [id number] Patient Account Number: [id number] Date of Birth/Sex: December 25, 1945 (73 y.o. F) Treating RN: Army Melia Primary Care Lenay Lovejoy: Clarene Reamer Other Clinician: Referring Dow Blahnik: Clarene Reamer Treating Rudolph Daoust/Extender: Melburn Hake, HOYT Weeks in Treatment: 1 Compression Therapy Performed for Wound Assessment: Wound #1 Left,Medial Lower Leg Performed By: Clinician Army Melia, RN Compression Type: Rolena Infante Electronic Signature(s) Signed: 05/31/2019 8:02:25 AM By: Army Melia Entered By: Army Melia on 05/28/2019 08:42:48 [name], [name] -------------------------------------------------------------------------------- Compression Therapy Details Patient Name: [name]. Date of Service: 05/28/2019 8:30 AM Medical Record Number: [id number] Patient Account Number: [id number] Date of Birth/Sex: 25-Nov-1945 (73 y.o. F) Treating RN: Army Melia Primary Care Jurnei Latini: Clarene Reamer Other Clinician: Referring Tarron Krolak: Clarene Reamer Treating Erdem Naas/Extender: Melburn Hake, HOYT Weeks in Treatment: 1 Compression Therapy Performed for Wound Assessment: Wound #2 Left,Medial Ankle Performed By: Clinician Army Melia, RN Compression Type: Rolena Infante Electronic Signature(s) Signed: 05/31/2019 8:02:25 AM By: Army Melia Entered By: Army Melia on 05/28/2019 08:42:48 [name], [name] -------------------------------------------------------------------------------- Encounter Discharge Information Details Patient Name: [name]. Date of Service: 05/28/2019 8:30 AM Medical Record Number: [id number] Patient Account Number: [id number] Date of Birth/Sex: 1945-06-21 (73 y.o. F) Treating RN: Army Melia Primary Care Elener Custodio: Clarene Reamer Other Clinician: Referring Sofia Vanmeter: Clarene Reamer Treating Evgenia Merriman/Extender: Sharalyn Ink in Treatment: 1 Encounter Discharge Information Items Discharge Condition: Stable Ambulatory Status: Ambulatory Discharge Destination: Home Transportation: Private Auto Accompanied By: self Schedule Follow-up Appointment: Yes Clinical Summary of Care: Electronic Signature(s) Signed: 05/31/2019 8:02:25 AM By: Army Melia Entered By: Army Melia on 05/28/2019 08:43:20 [name], [name] -------------------------------------------------------------------------------- Wound Assessment Details Patient Name: [name]. Date of Service: 05/28/2019 8:30 AM Medical Record Number: [id number] Patient Account Number: [id number] Date of Birth/Sex:  [birth year] (73 y.o. F) Treating RN: Army Melia Primary Care Lavren Lewan: Clarene Reamer Other Clinician: Referring Marieclaire Bettenhausen: Clarene Reamer Treating Tyshawna Alarid/Extender: Melburn Hake, HOYT Weeks in Treatment: 1 Wound Status Wound Number: 1 Primary Etiology: Lymphedema Wound Location: Left, Medial Lower Leg Wound Status: Open Wounding Event: Gradually Appeared Date Acquired: 08/24/2018 Weeks Of Treatment: 1 Clustered Wound: No Wound Measurements Length: (cm) 0.8 Width: (cm) 0.8 Depth: (cm) 0.3 Area: (cm) 0.503 Volume: (cm) 0.151 % Reduction in Area: 0% % Reduction in Volume: 0% Wound Description Classification: Full Thickness Without Exposed Support Structure s Treatment Notes Wound #1 (Left, Medial Lower Leg) Notes silver cell, ABD, unna boot Electronic Signature(s) Signed: 05/31/2019 8:02:25 AM By: Army Melia Entered By: Nicki Reaper,  Dajea on 05/28/2019 08:42:35 [name], [name] -------------------------------------------------------------------------------- Wound Assessment Details Patient Name: [name], [name]. Date of Service: 05/28/2019 8:30 AM Medical Record Number: [id number] Patient Account Number: [id number] Date of Birth/Sex: 10-03-1945 (73 y.o. F) Treating RN: Army Melia Primary Care Frans Valente: Clarene Reamer Other Clinician: Referring Ardean Melroy: Clarene Reamer Treating Abram Sax/Extender: Melburn Hake, HOYT Weeks in Treatment: 1 Wound Status Wound Number: 2 Primary Etiology: Lymphedema Wound Location: Left, Medial Ankle Wound Status: Open Wounding Event: Gradually Appeared Date Acquired: 08/30/2018 Weeks Of Treatment: 1 Clustered Wound: No Wound Measurements Length: (cm) 1.6 Width: (cm) 1.7 Depth: (cm) 0.3 Area: (cm) 2.136 Volume: (cm) 0.641 % Reduction in Area: 0% % Reduction in Volume: 0% Wound Description Classification: Full Thickness Without Exposed Support Structure s Treatment Notes Wound #2 (Left, Medial Ankle) Notes silver  cell, ABD, unna boot Electronic Signature(s) Signed: 05/31/2019 8:02:25 AM By: Army Melia Entered By: Army Melia on 05/28/2019 08:42:35

## 2019-06-01 ENCOUNTER — Encounter: Payer: Medicare Other | Admitting: Physician Assistant

## 2019-06-01 ENCOUNTER — Other Ambulatory Visit: Payer: Self-pay

## 2019-06-01 DIAGNOSIS — I872 Venous insufficiency (chronic) (peripheral): Secondary | ICD-10-CM | POA: Diagnosis not present

## 2019-06-01 DIAGNOSIS — L97329 Non-pressure chronic ulcer of left ankle with unspecified severity: Secondary | ICD-10-CM | POA: Diagnosis not present

## 2019-06-01 DIAGNOSIS — I890 Lymphedema, not elsewhere classified: Secondary | ICD-10-CM | POA: Diagnosis not present

## 2019-06-01 DIAGNOSIS — Z6841 Body Mass Index (BMI) 40.0 and over, adult: Secondary | ICD-10-CM | POA: Diagnosis not present

## 2019-06-01 DIAGNOSIS — L97322 Non-pressure chronic ulcer of left ankle with fat layer exposed: Secondary | ICD-10-CM | POA: Diagnosis not present

## 2019-06-01 DIAGNOSIS — L97821 Non-pressure chronic ulcer of other part of left lower leg limited to breakdown of skin: Secondary | ICD-10-CM | POA: Diagnosis not present

## 2019-06-01 DIAGNOSIS — L97822 Non-pressure chronic ulcer of other part of left lower leg with fat layer exposed: Secondary | ICD-10-CM | POA: Diagnosis not present

## 2019-06-01 DIAGNOSIS — I10 Essential (primary) hypertension: Secondary | ICD-10-CM | POA: Diagnosis not present

## 2019-06-01 NOTE — Progress Notes (Addendum)
[name], [name] ([id number]) Visit Report for 06/01/2019 Chief Complaint Document Details Patient Name: [name]. Date of Service: 06/01/2019 8:30 AM Medical Record Number: [id number] Patient Account Number: [id number] Date of Birth/Sex: [birth year] (74 y.o. F) Treating RN: Montey Hora Primary Care Provider: Clarene Reamer Other Clinician: Referring Provider: Clarene Reamer Treating Provider/Extender: Melburn Hake, HOYT Weeks in Treatment: 2 Information Obtained from: Patient Chief Complaint Left LE and ankle ulcers Electronic Signature(s) Signed: 06/01/2019 8:43:12 AM By: Worthy Keeler PA-C Entered By: Worthy Keeler on 06/01/2019 08:43:12 Uttech, [name] ([id number]) -------------------------------------------------------------------------------- HPI Details Patient Name: [name]. Date of Service: 06/01/2019 8:30 AM Medical Record Number: [id number] Patient Account Number: [id number] Date of Birth/Sex: [birth year] (74 y.o. F) Treating RN: Montey Hora Primary Care Provider: Clarene Reamer Other Clinician: Referring Provider: Clarene Reamer Treating Provider/Extender: Melburn Hake, HOYT Weeks in Treatment: 2 History of Present Illness HPI Description: 05/17/2019 upon evaluation today patient presents today for initial evaluation here in the clinic regarding her left lower extremity open wound secondary to lymphedema. She has previously been seen at the [facility] clinic 1 time in July 2020 but did not have any follow-up after that point. Subsequently she has not been seen here in [place] prior to today. Currently she tells me that she has been having issues for the past 1-2 months with wounds over the left lower extremity 2 that are more distinct she did has a breakdown of an area which is much more superficial as well on her leg but in general she mainly is dealing with what I would term stage III lymphedema. Fortunately there is no signs of active cellulitis she  does have erythema but I think this is more stasis dermatitis than anything infectious in nature. Fortunately there is no evidence of systemic infection which is also good news. She does have a history of lymphedema though she is never worn compression stockings she tells me. She does have hypertension as well. 05/25/2019 upon evaluation today patient appears to be doing about the same with regard to her wounds upon inspection today. Her edema is somewhat better but still with the larger calf muscle area and smaller area around her ankle it is very difficult to wrap her appropriately without the sliding down. Patient really did not feel like it slid down too much nonetheless it did appear to slide quite a bit based on what we were seeing today. Last Thursday was the same. With that being said she still does need compression we did have a discussion between several of the nurses and myself today with the patient about what the best thing to do may be. 06/01/2019 upon evaluation today patient appears to be doing slightly better in regard to her leg at this point. There are still a lot of irritation but she does not seem to be having as much as much issue with the weeping as compared to last time still we are not really doing a whole lot better as of yet in general. Electronic Signature(s) Signed: 06/01/2019 8:53:35 AM By: Worthy Keeler PA-C Entered By: Worthy Keeler on 06/01/2019 08:53:35 Hendrie, [name] ([id number]) -------------------------------------------------------------------------------- Physical Exam Details Patient Name: Alyssa Reges C. Date of Service: 06/01/2019 8:30 AM Medical Record Number: [id number] Patient Account Number: [id number] Date of Birth/Sex: 1945/03/31 (74 y.o. F) Treating RN: Montey Hora Primary Care Provider: Clarene Reamer Other Clinician: Referring Provider: Clarene Reamer Treating Provider/Extender: Melburn Hake, HOYT Weeks in Treatment:  2 Constitutional Obese and well-hydrated in no acute  distress. Respiratory normal breathing without difficulty. Psychiatric this patient is able to make decisions and demonstrates good insight into disease process. Alert and Oriented x 3. pleasant and cooperative. Notes Patient's leg does show signs of erythema due to stasis dermatitis as well as dry scaly skin. I do believe that she may benefit from topical steroids to the leg underneath the wrap and still continuing with the compression wrap as before. Electronic Signature(s) Signed: 06/01/2019 8:54:41 AM By: Worthy Keeler PA-C Entered By: Worthy Keeler on 06/01/2019 08:54:40 Arriola, [name] ([id number]) -------------------------------------------------------------------------------- Physician Orders Details Patient Name: [name] Date of Service: 06/01/2019 8:30 AM Medical Record Number: [id number] Patient Account Number: [id number] Date of Birth/Sex: [birth year] (74 y.o. 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 Skin Barriers/Peri-Wound Care o Triamcinolone Acetonide Ointment (TCA) - to lower leg Primary Wound Dressing Wound #1 Left,Medial Lower Leg o Silver Alginate Secondary Dressing Wound #1 Left,Medial Lower Leg o ABD  pad Dressing Change Frequency Wound #1 Left,Medial Lower Leg o Other: - Twice weekly Follow-up Appointments Wound #1 Left,Medial Lower Leg o Return Appointment in 1 week. o Nurse Visit as needed - Thursday or Friday Edema Control Wound #1 Left,Medial Lower Leg o Unna Boot to Left Lower Extremity o Elevate legs to the level of the heart and pump ankles as often as possible o Other: - Tubi G at the top of patient's calf Electronic Signature(s) Signed: 06/01/2019 10:46:24 AM By: Montey Hora Signed: 06/01/2019 4:47:23 PM By: Worthy Keeler PA-C Entered By: Montey Hora on 06/01/2019 08:51:26 Ninh, [name] ([id number]) -------------------------------------------------------------------------------- Problem List Details Patient Name: Alyssa Reges C. Date of Service: 06/01/2019 8:30 AM Medical Record Number: [id number] Patient Account Number: [id number] Date of Birth/Sex: Nov 15, 1945 (74 y.o. F) Treating RN: Montey Hora Primary Care Provider: Clarene Reamer Other Clinician: Referring Provider: Clarene Reamer Treating Provider/Extender: Melburn Hake, HOYT Weeks in Treatment: 2 Active Problems ICD-10 Evaluated Encounter Code Description Active Date Today Diagnosis I89.0 Lymphedema, not elsewhere classified 05/17/2019 No Yes L97.822 Non-pressure chronic ulcer of other part of left lower leg with fat layer 05/17/2019 No Yes exposed L97.322 Non-pressure chronic ulcer of left ankle with fat layer exposed 05/17/2019 No Yes L97.821 Non-pressure chronic ulcer of other part of left lower leg limited to 05/17/2019 No Yes breakdown of skin I10 Essential (primary) hypertension 05/17/2019 No Yes Inactive Problems Resolved Problems Electronic Signature(s) Signed: 06/01/2019 8:42:44 AM By: Worthy Keeler PA-C Entered By: Worthy Keeler on 06/01/2019 08:42:44 Zachery, [name] ([id number]) -------------------------------------------------------------------------------- Progress  Note Details Patient Name: [name]. Date of Service: 06/01/2019 8:30 AM Medical Record Number: [id number] Patient Account Number: [id number] Date of Birth/Sex: November 11, 1945 (74 y.o. F) Treating RN: Montey Hora Primary Care Provider: Clarene Reamer Other Clinician: Referring Provider: Clarene Reamer Treating Provider/Extender: Melburn Hake, HOYT Weeks in Treatment: 2 Subjective Chief Complaint Information obtained from Patient Left LE and ankle ulcers History of  Present Illness (HPI) 05/17/2019 upon evaluation today patient presents today for initial evaluation here in the clinic regarding her left lower extremity open wound secondary to lymphedema. She has previously been seen at the [facility] clinic 1 time in July 2020 but did not have any follow-up after that point. Subsequently she has not been seen here in [place] prior to today. Currently she tells me that she has been having issues for the past 1-2 months with wounds over the left lower extremity 2 that are more distinct she did has a breakdown of an area which is much more superficial as well on her leg but in general she mainly is dealing with what I would term stage III lymphedema. Fortunately there is no signs of active cellulitis she does have erythema but I think this is more stasis dermatitis than anything infectious in nature. Fortunately there is no evidence of systemic infection which is also good news. She does have a history of lymphedema though she is never worn compression stockings she tells me. She does have hypertension as well. 05/25/2019 upon evaluation today patient appears to be doing about the same with regard to her wounds upon inspection today. Her edema is somewhat better but still with the larger calf muscle area and smaller area around her ankle it is very difficult to wrap her appropriately without the sliding down. Patient really did not feel like it slid down too much nonetheless it did appear to  slide quite a bit based on what we were seeing today. Last Thursday was the same. With that being said she still does need compression we did have a discussion between several of the nurses and myself today with the patient about what the best thing to do may be. 06/01/2019 upon evaluation today patient appears to be doing slightly better in regard to her leg at this point. There are still a lot of irritation but she does not seem to be having as much as much issue with the weeping as compared to last time still we are not really doing a whole lot better as of yet in general. Objective Constitutional Obese and well-hydrated in no acute distress. Vitals Time Taken: 8:30 AM, Height: 62 in, Weight: 380 lbs, BMI: 69.5, Temperature: 97.8 F, Pulse: 83 bpm, Respiratory Rate: 16 breaths/min, Blood Pressure: 155/66 mmHg. Respiratory normal breathing without difficulty. Psychiatric this patient is able to make decisions and demonstrates good insight into disease process. Alert and Oriented x 3. pleasant and cooperative. General Notes: Patient's leg does show signs of erythema due to stasis dermatitis as well as dry scaly skin. I do believe that she may benefit from topical steroids to the leg underneath the wrap and still continuing with the compression wrap as before. Integumentary (Hair, Skin) Wound #1 status is Open. Original cause of wound was Gradually Appeared. The wound is located on the Left,Medial Lower Leg. The wound measures 9.9cm length x 8cm width x 0.1cm depth; 62.204cm^2 area and 6.22cm^3 volume. There is Fat Layer (Subcutaneous Tissue) Exposed exposed. There is no tunneling or undermining noted. There is a medium amount of serosanguineous drainage noted. There is medium (34-66%) red granulation within the wound bed. There is a medium (34-66%) amount of necrotic tissue within the wound bed including Adherent Slough. SAMANATHA, KERNAGHAN ([id number]) Wound #2 status is Converted. Original  cause of wound was Gradually Appeared. The wound is located on the Left,Medial Ankle. The wound measures 1.6cm length x 1.7cm width x 0.3cm depth; 2.136cm^2 area and  0.641cm^3 volume. 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 There was a Haematologist Compression Therapy Procedure with a pre-treatment ABI of 1.2 by Montey Hora, RN. Post procedure Diagnosis Wound #1: Same as Pre-Procedure Plan Wound Cleansing: Wound #1 Left,Medial Lower Leg: Cleanse wound with mild soap and water - in office May shower with protection. Skin Barriers/Peri-Wound Care: Triamcinolone Acetonide Ointment (TCA) - to lower leg Primary Wound Dressing: Wound #1 Left,Medial Lower Leg: Silver Alginate Secondary Dressing: Wound #1 Left,Medial Lower Leg: ABD pad Dressing Change Frequency: Wound #1 Left,Medial Lower Leg: Other: - Twice weekly Follow-up Appointments: Wound #1 Left,Medial Lower Leg: Return Appointment in 1 week. Nurse Visit as needed - Thursday or Friday Edema Control: Wound #1 Left,Medial Lower Leg: Unna Boot to Left Lower Extremity Elevate legs to the level of the heart and pump ankles as often as possible Other: - Tubi G at the top of patient's calf 1. I would recommend currently that we continue with the compression wrap and currently we are doing a piece of metal combination wrap were were using an Unna boot to the left lower extremity. We are then subsequently using Tubigrip G at the top of the patient's calf for he really cannot wrap this without it unfortunately sliding down  significantly. She did fairly well with this over the past week. 2. We can continue as well to use a silver alginate dressing over the open weeping areas we will use triamcinolone over the remainder region of her leg in order to help with the irritation and inflammation. Kobus, Shatonia C. ([id number]) 3. I am recommending that the patient needs to elevate her legs is much as possible I do not think there is any specific activity she has to limit although I do believe that if she is up for an hour she should spend 30 minutes elevating her legs to try to get the edema down. And when she is up she needs to continually be moving or doing so that she is pumping the fluid up through the calf muscle action as much as possible. I really do not want her standing still in one place for long periods of time. We will see patient back for reevaluation in 1 week here in the clinic. If anything worsens or changes patient will contact our office for additional recommendations. Electronic Signature(s) Signed: 06/01/2019 8:56:11 AM By: Worthy Keeler PA-C Entered By: Worthy Keeler on 06/01/2019 08:56:11 Bednarczyk, [name] ([id number]) -------------------------------------------------------------------------------- SuperBill Details Patient Name: [name] Date of Service: 06/01/2019 Medical Record Number: [id number] Patient Account Number: [id number] Date of Birth/Sex: 1945/08/18 (74 y.o. F) Treating RN: Montey Hora Primary Care Provider: Clarene Reamer Other Clinician: Referring Provider: Clarene Reamer Treating Provider/Extender: Melburn Hake, HOYT Weeks in Treatment: 2 Diagnosis Coding ICD-10 Codes Code Description I89.0 Lymphedema, not elsewhere classified L97.822 Non-pressure chronic ulcer of other part of left lower leg with fat layer exposed L97.322 Non-pressure chronic ulcer of left ankle with fat layer exposed L97.821 Non-pressure chronic ulcer of other part of left lower leg limited to  breakdown of skin I10 Essential (primary) hypertension Facility Procedures CPT4 Code: [id number] Description: (Facility Use Only)  [facility] Modifier: Quantity: 1 Physician Procedures CPT4 Code: [id number] Description: [id number] - WC PHYS LEVEL 3 - EST PT Modifier: Quantity: 1 CPT4 Code: Description: ICD-10 Diagnosis Description I89.0 Lymphedema, not elsewhere classified L97.822 Non-pressure chronic ulcer of other part of left lower leg with fat layer ex L97.322 Non-pressure chronic ulcer of left ankle with fat layer exposed L97.821  Non-pressure chronic ulcer of other part of left lower leg limited to breakd Modifier: posed own of skin Quantity: Electronic Signature(s) Signed: 06/01/2019 8:56:27 AM By: Worthy Keeler PA-C Entered By: Worthy Keeler on 06/01/2019 08:56:27

## 2019-06-01 NOTE — Progress Notes (Signed)
[name], [name] Visit Report for 06/01/2019 Arrival Information Details Patient Name: [name], [name]. Date of Service: 06/01/2019 8:30 AM Medical Record Number: [id number] Patient Account Number: [id number] Date of Birth/Sex: 04/23/1945 (74 y.o. F) Treating RN: Army Melia Primary Care Travas Schexnayder: Clarene Reamer Other Clinician: Referring Audrey Thull: Clarene Reamer Treating Aislynn Cifelli/Extender: Melburn Hake, HOYT Weeks in Treatment: 2 Visit Information History Since Last Visit Added or deleted any medications: No Patient Arrived: Ambulatory Any new allergies or adverse reactions: No Arrival Time: 08:30 Had a fall or experienced change in No Accompanied By: self activities of daily living that may affect Transfer Assistance: None risk of falls: Patient Identification Verified: Yes Signs or symptoms of abuse/neglect since last visito No Hospitalized since last visit: No Has Dressing in Place as Prescribed: Yes Pain Present Now: No Electronic Signature(s) Signed: 06/01/2019 10:43:03 AM By: Army Melia Entered By: Army Melia on 06/01/2019 08:30:26 [name], [name] -------------------------------------------------------------------------------- Compression Therapy Details Patient Name: [name]. Date of Service: 06/01/2019 8:30 AM Medical Record Number: [id number] Patient Account Number: [id number] Date of Birth/Sex: 05-06-1945 (74 y.o. F) Treating RN: Montey Hora Primary Care Tarryn Bogdan: Clarene Reamer Other Clinician: Referring Jendaya Gossett: Clarene Reamer Treating Laurine Kuyper/Extender: Melburn Hake, HOYT Weeks in Treatment: 2 Compression Therapy Performed for Wound Assessment: Wound #1 Left,Medial Lower Leg Performed By: Clinician Montey Hora, RN Compression Type: Rolena Infante Pre Treatment ABI: 1.2 Post Procedure Diagnosis Same as Pre-procedure Electronic Signature(s) Signed: 06/01/2019 10:46:24 AM By: Montey Hora Entered By: Montey Hora on  06/01/2019 08:49:08 [name], [name] -------------------------------------------------------------------------------- Encounter Discharge Information Details Patient Name: [name]. Date of Service: 06/01/2019 8:30 AM Medical Record Number: [id number] Patient Account Number: [id number] Date of Birth/Sex: [birth year] (74 y.o. F) Treating RN: Montey Hora Primary Care Rahm Minix: Clarene Reamer Other Clinician: Referring Donovin Kraemer: Clarene Reamer Treating Imanii Gosdin/Extender: Sharalyn Ink in Treatment: 2 Encounter Discharge Information Items Discharge Condition: Stable Ambulatory Status: Ambulatory Discharge Destination: Home Transportation: Private Auto Accompanied By: self Schedule Follow-up Appointment: Yes Clinical Summary of Care: Electronic Signature(s) Signed: 06/01/2019 10:46:24 AM By: Montey Hora Entered By: Montey Hora on 06/01/2019 08:53:06 [name], [name] -------------------------------------------------------------------------------- Lower Extremity Assessment Details Patient Name: [name]. Date of Service: 06/01/2019 8:30 AM Medical Record Number: [id number] Patient Account Number: [id number] Date of Birth/Sex: 06-13-1945 (74 y.o. F) Treating RN: Army Melia Primary Care Tanazia Achee: Clarene Reamer Other Clinician: Referring Zakariya Knickerbocker: Clarene Reamer Treating Vyncent Overby/Extender: Melburn Hake, HOYT Weeks in Treatment: 2 Edema Assessment Assessed: [Left: No] [Right: No] Edema: [Left: Ye] [Right: s] Calf Left: Right: Point of Measurement: 32 cm From Medial Instep 46 cm cm Ankle Left: Right: Point of Measurement: 10 cm From Medial Instep 28 cm cm Vascular Assessment Pulses: Dorsalis Pedis Palpable: [Left:Yes] Electronic Signature(s) Signed: 06/01/2019 10:43:03 AM By: Army Melia Entered By: Army Melia on 06/01/2019 08:39:39 Eschbach, Fathima Loletha [name]  ([id number]) -------------------------------------------------------------------------------- Multi Wound Chart Details Patient Name: [name]. Date of Service: 06/01/2019 8:30 AM Medical Record Number: [id number] Patient Account Number: [id number] Date of Birth/Sex: [birth year] (74 y.o. F) Treating RN: Montey Hora Primary Care Ichael Pullara: Clarene Reamer Other Clinician: Referring Amere Iott: Clarene Reamer Treating Darlyn Repsher/Extender: Melburn Hake, HOYT Weeks in Treatment: 2 Vital Signs Height(in): 62 Pulse(bpm): 83 Weight(lbs): 380 Blood Pressure(mmHg): 155/66 Body Mass Index(BMI): 69 Temperature(F): 97.8 Respiratory Rate(breaths/min): 16 Photos: [2:No Photos] [N/A:N/A] Wound Location: Left Lower Leg - Medial Left, Medial Ankle N/A Wounding Event: Gradually Appeared Gradually Appeared N/A Primary Etiology: Lymphedema Lymphedema N/A Comorbid History: Hypertension N/A N/A Date Acquired: 08/24/2018  08/30/2018 N/A Weeks of Treatment: 2 2 N/A Wound Status: Open Converted N/A Measurements L x W x D (cm) 9.9x8x0.1 1.6x1.7x0.3 N/A Area (cm) : 62.204 2.136 N/A Volume (cm) : 6.22 0.641 N/A % Reduction in Area: -12266.60% 0.00% N/A % Reduction in Volume: -4019.20% 0.00% N/A Classification: Full Thickness Without Exposed Full Thickness Without Exposed N/A Support Structures Support Structures Exudate Amount: Medium N/A N/A Exudate Type: Serosanguineous N/A N/A Exudate Color: red, brown N/A N/A Granulation Amount: Medium (34-66%) N/A N/A Granulation Quality: Red N/A N/A Necrotic Amount: Medium (34-66%) N/A N/A Exposed Structures: Fat Layer (Subcutaneous Tissue) N/A N/A Exposed: Yes Fascia: No Tendon: No Muscle: No Joint: No Bone: No Epithelialization: None N/A N/A Treatment Notes Electronic Signature(s) Signed: 06/01/2019 10:46:24 AM By: Montey Hora Entered By: Montey Hora on 06/01/2019 08:47:49 [name], [name] -------------------------------------------------------------------------------- [place] Details Patient Name: [name]. Date of Service: 06/01/2019 8:30 AM Medical Record Number: [id number] Patient Account Number: [id number] Date of Birth/Sex: 11-21-1945 (74 y.o. F) Treating RN: Montey Hora Primary Care Yavonne Kiss: Clarene Reamer Other Clinician: Referring Deshondra Worst: Clarene Reamer Treating Elizer Bostic/Extender: Melburn Hake, HOYT Weeks in Treatment: 2 Active Inactive Orientation to the Wound Care Program Nursing Diagnoses: Knowledge deficit related to the wound healing center program Goals: Patient/caregiver will verbalize understanding of the [facility] Program Date Initiated: 05/17/2019 Target Resolution Date: 06/04/2019 Goal Status: Active Interventions: Provide education on orientation to the wound center Notes: Wound/Skin Impairment Nursing Diagnoses: Impaired tissue integrity Goals: Ulcer/skin breakdown will have a volume reduction of 30% by week 4 Date Initiated: 05/17/2019 Target Resolution Date: 06/05/2019 Goal Status: Active Interventions: Assess ulceration(s) every visit Notes: Electronic Signature(s) Signed: 06/01/2019 10:46:24 AM By: Montey Hora Entered By: Montey Hora on 06/01/2019 08:47:38 [name], [name] -------------------------------------------------------------------------------- Pain Assessment Details Patient Name: [name]. Date of Service: 06/01/2019 8:30 AM Medical Record Number: [id number] Patient Account Number: [id number] Date of Birth/Sex: 30-Jun-1945 (74 y.o. F) Treating RN: Army Melia Primary Care Nessa Ramaker: Clarene Reamer Other Clinician: Referring Yoltzin Barg: Clarene Reamer Treating Rajat Staver/Extender: Melburn Hake, HOYT Weeks in Treatment: 2 Active Problems Location of Pain Severity and Description of Pain Patient Has Paino No Site Locations Pain Management and  Medication Current Pain Management: Electronic Signature(s) Signed: 06/01/2019 10:43:03 AM By: Army Melia Entered By: Army Melia on 06/01/2019 08:35:45 [name], [name] -------------------------------------------------------------------------------- Patient/Caregiver Education Details Patient Name: [name]. Date of Service: 06/01/2019 8:30 AM Medical Record Number: [id number] Patient Account Number: [id number] Date of Birth/Gender: [birth year] (74 y.o. F) Treating RN: Montey Hora Primary Care Physician: Clarene Reamer Other Clinician: Referring Physician: Clarene Reamer Treating Physician/Extender: Sharalyn Ink in Treatment: 2 Education Assessment Education Provided To: Patient Education Topics Provided Venous: Handouts: Other: need for ongoing compression Methods: Explain/Verbal Responses: State content correctly Electronic Signature(s) Signed: 06/01/2019 10:46:24 AM By: Montey Hora Entered By: Montey Hora on 06/01/2019 08:52:09 Balbi, [name] -------------------------------------------------------------------------------- Wound Assessment Details Patient Name: Alyssa Reges C. Date of Service: 06/01/2019 8:30 AM Medical Record Number: [id number] Patient Account Number: [id number] Date of Birth/Sex: [birth year] (74 y.o. F) Treating RN: Army Melia Primary Care Nachum Derossett: Clarene Reamer Other Clinician: Referring Lyncoln Ledgerwood: Clarene Reamer Treating Doriana Mazurkiewicz/Extender: Melburn Hake, HOYT Weeks in Treatment: 2 Wound Status Wound Number: 1 Primary Etiology: Lymphedema Wound Location: Left Lower Leg - Medial Wound Status: Open Wounding Event: Gradually Appeared Comorbid History: Hypertension Date Acquired: 08/24/2018 Weeks Of Treatment: 2 Clustered Wound: No Photos Wound Measurements Length: (cm) 9.9 Width: (cm) 8 Depth: (cm) 0.1 Area: (  cm) 62.204 Volume: (cm) 6.22 % Reduction in Area: -12266.6% % Reduction in  Volume: -4019.2% Epithelialization: None Tunneling: No Undermining: No Wound Description Classification: Full Thickness Without Exposed Support Struc Exudate Amount: Medium Exudate Type: Serosanguineous Exudate Color: red, brown tures Foul Odor After Cleansing: No Slough/Fibrino Yes Wound Bed Granulation Amount: Medium (34-66%) Exposed Structure Granulation Quality: Red Fascia Exposed: No Necrotic Amount: Medium (34-66%) Fat Layer (Subcutaneous Tissue) Exposed: Yes Necrotic Quality: Adherent Slough Tendon Exposed: No Muscle Exposed: No Joint Exposed: No Bone Exposed: No Treatment Notes Wound #1 (Left, Medial Lower Leg) Notes silver cell, ABD, unna boot with tubi g at the top Electronic Signature(s) Signed: 06/01/2019 10:43:03 AM By: Verl Dicker, [name] ([id number]) Entered By: Army Melia on 06/01/2019 08:38:28 Guild, [name] ([id number]) -------------------------------------------------------------------------------- Wound Assessment Details Patient Name: Alyssa Reges C. Date of Service: 06/01/2019 8:30 AM Medical Record Number: [id number] Patient Account Number: [id number] Date of Birth/Sex: 1945-05-09 (74 y.o. F) Treating RN: Army Melia Primary Care Brandyce Dimario: Clarene Reamer Other Clinician: Referring Dhruva Orndoff: Clarene Reamer Treating Chanah Tidmore/Extender: Melburn Hake, HOYT Weeks in Treatment: 2 Wound Status Wound Number: 2 Primary Etiology: Lymphedema Wound Location: Left, Medial Ankle Wound Status: Converted Wounding Event: Gradually Appeared Date Acquired: 08/30/2018 Weeks Of Treatment: 2 Clustered Wound: No Wound Measurements Length: (cm) 1.6 Width: (cm) 1.7 Depth: (cm) 0.3 Area: (cm) 2.136 Volume: (cm) 0.641 % Reduction in Area: 0% % Reduction in Volume: 0% Wound Description Classification: Full Thickness Without Exposed Support Structu res Electronic Signature(s) Signed: 06/01/2019 10:43:03 AM By: Army Melia Entered By: Army Melia on  06/01/2019 08:36:31 Secrist, [name] ([id number]) -------------------------------------------------------------------------------- Vitals Details Patient Name: [name]. Date of Service: 06/01/2019 8:30 AM Medical Record Number: [id number] Patient Account Number: [id number] Date of Birth/Sex: 10-18-1945 (74 y.o. F) Treating RN: Army Melia Primary Care Arianie Couse: Clarene Reamer Other Clinician: Referring Marquest Gunkel: Clarene Reamer Treating Kamauri Kathol/Extender: Melburn Hake, HOYT Weeks in Treatment: 2 Vital Signs Time Taken: 08:30 Temperature (F): 97.8 Height (in): 62 Pulse (bpm): 83 Weight (lbs): 380 Respiratory Rate (breaths/min): 16 Body Mass Index (BMI): 69.5 Blood Pressure (mmHg): 155/66 Reference Range: 80 - 120 mg / dl Electronic Signature(s) Signed: 06/01/2019 10:43:03 AM By: Army Melia Entered By: Army Melia on 06/01/2019 08:35:39

## 2019-06-04 ENCOUNTER — Other Ambulatory Visit: Payer: Self-pay

## 2019-06-04 DIAGNOSIS — I10 Essential (primary) hypertension: Secondary | ICD-10-CM | POA: Diagnosis not present

## 2019-06-04 DIAGNOSIS — I890 Lymphedema, not elsewhere classified: Secondary | ICD-10-CM | POA: Diagnosis not present

## 2019-06-04 DIAGNOSIS — L97821 Non-pressure chronic ulcer of other part of left lower leg limited to breakdown of skin: Secondary | ICD-10-CM | POA: Diagnosis not present

## 2019-06-04 DIAGNOSIS — L97822 Non-pressure chronic ulcer of other part of left lower leg with fat layer exposed: Secondary | ICD-10-CM | POA: Diagnosis not present

## 2019-06-04 DIAGNOSIS — Z6841 Body Mass Index (BMI) 40.0 and over, adult: Secondary | ICD-10-CM | POA: Diagnosis not present

## 2019-06-04 DIAGNOSIS — L97322 Non-pressure chronic ulcer of left ankle with fat layer exposed: Secondary | ICD-10-CM | POA: Diagnosis not present

## 2019-06-04 NOTE — Progress Notes (Signed)
COPPER, WITKIN ([id number]) Visit Report for 06/04/2019 Arrival Information Details Patient Name: [name], [name]. Date of Service: 06/04/2019 8:00 AM Medical Record Number: [id number] Patient Account Number: [id number] Date of Birth/Sex: October 26, 1945 (73 y.o. F) Treating RN: Army Melia Primary Care Girolamo Lortie: Clarene Reamer Other Clinician: Referring Yessika Otte: Clarene Reamer Treating Belmont Paone/Extender: Melburn Hake, HOYT Weeks in Treatment: 2 Visit Information History Since Last Visit Added or deleted any medications: No Patient Arrived: Ambulatory Any new allergies or adverse reactions: No Arrival Time: 08:14 Had a fall or experienced change in No Accompanied By: self activities of daily living that may affect Transfer Assistance: None risk of falls: Patient Identification Verified: Yes Signs or symptoms of abuse/neglect since last visito No Hospitalized since last visit: No Has Dressing in Place as Prescribed: Yes Pain Present Now: No Electronic Signature(s) Signed: 06/04/2019 10:41:25 AM By: Army Melia Entered By: Army Melia on 06/04/2019 08:14:27 [name] -------------------------------------------------------------------------------- Compression Therapy Details Patient Name: [name]. Date of Service: 06/04/2019 8:00 AM Medical Record Number: [id number] Patient Account Number: [id number] Date of Birth/Sex: 22-Apr-1945 (73 y.o. F) Treating RN: Army Melia Primary Care Regina Ganci: Clarene Reamer Other Clinician: Referring England Greb: Clarene Reamer Treating Bralyn Folkert/Extender: Melburn Hake, HOYT Weeks in Treatment: 2 Compression Therapy Performed for Wound Assessment: Wound #1 Left,Medial Lower Leg Performed By: Clinician Army Melia, RN Compression Type: Rolena Infante Electronic Signature(s) Signed: 06/04/2019 8:22:23 AM By: Army Melia Entered By: Army Melia on 06/04/2019 08:22:23 [name] -------------------------------------------------------------------------------- Encounter Discharge Information Details Patient Name: [name]. Date of Service: 06/04/2019 8:00 AM Medical Record Number: [id number] Patient Account Number: [id number] Date of Birth/Sex: [birth year] (73 y.o. F) Treating RN: Army Melia Primary Care Raynah Gomes: Clarene Reamer Other Clinician: Referring Palmyra Rogacki: Clarene Reamer Treating Dorthea Maina/Extender: Sharalyn Ink in Treatment: 2 Encounter Discharge Information Items Discharge Condition: Stable Ambulatory Status: Ambulatory Discharge Destination: Home Transportation: Private Auto Accompanied By: self Schedule Follow-up Appointment: Yes Clinical Summary of Care: Electronic Signature(s) Signed: 06/04/2019 8:23:25 AM By: Army Melia Entered By: Army Melia on 06/04/2019 08:23:24 [name] -------------------------------------------------------------------------------- Wound Assessment Details Patient Name: [name]. Date of Service: 06/04/2019 8:00 AM Medical Record Number: [id number] Patient Account Number: [id number] Date of Birth/Sex: [birth year] (73 y.o. F) Treating RN: Army Melia Primary Care Monserath Neff: Clarene Reamer Other Clinician: Referring Allexa Acoff: Clarene Reamer Treating Moua Rasmusson/Extender: Melburn Hake, HOYT Weeks in Treatment: 2 Wound Status Wound Number: 1 Primary Etiology: Lymphedema Wound Location: Left Lower Leg - Medial Wound Status: Open Wounding Event: Gradually Appeared Comorbid History: Hypertension Date Acquired: 08/24/2018 Weeks Of Treatment: 2 Clustered Wound: No Photos Wound Measurements Length: (cm) 7 Width: (cm) 4.5 Depth: (cm) 0.1 Area: (cm) 24.74 Volume: (cm) 2.474 % Reduction in Area: -4818.5% % Reduction in Volume: -1538.4% Epithelialization: None Wound Description Classification: Full Thickness Without Exposed Support Struct Exudate Amount:  Medium Exudate Type: Serosanguineous Exudate Color: red, brown ures Foul Odor After Cleansing: No Slough/Fibrino Yes Wound Bed Granulation Amount: Medium (34-66%) Exposed Structure Granulation Quality: Red Fascia Exposed: No Necrotic Amount: Medium (34-66%) Fat Layer (Subcutaneous Tissue) Exposed: Yes Necrotic Quality: Adherent Slough Tendon Exposed: No Muscle Exposed: No Joint Exposed: No Bone Exposed: No Treatment Notes Wound #1 (Left, Medial Lower Leg) Notes silver cell, ABD, unna boot with tubi g at the top Electronic Signature(s) Signed: 06/04/2019 8:22:08 AM By: [name] Entered By: Army Melia on 06/04/2019 08:22:07

## 2019-06-08 ENCOUNTER — Other Ambulatory Visit: Payer: Self-pay

## 2019-06-08 ENCOUNTER — Encounter: Payer: Medicare Other | Admitting: Physician Assistant

## 2019-06-08 DIAGNOSIS — I10 Essential (primary) hypertension: Secondary | ICD-10-CM | POA: Diagnosis not present

## 2019-06-08 DIAGNOSIS — I890 Lymphedema, not elsewhere classified: Secondary | ICD-10-CM | POA: Diagnosis not present

## 2019-06-08 DIAGNOSIS — Z6841 Body Mass Index (BMI) 40.0 and over, adult: Secondary | ICD-10-CM | POA: Diagnosis not present

## 2019-06-08 DIAGNOSIS — L97821 Non-pressure chronic ulcer of other part of left lower leg limited to breakdown of skin: Secondary | ICD-10-CM | POA: Diagnosis not present

## 2019-06-08 DIAGNOSIS — L97322 Non-pressure chronic ulcer of left ankle with fat layer exposed: Secondary | ICD-10-CM | POA: Diagnosis not present

## 2019-06-08 DIAGNOSIS — L97822 Non-pressure chronic ulcer of other part of left lower leg with fat layer exposed: Secondary | ICD-10-CM | POA: Diagnosis not present

## 2019-06-08 NOTE — Progress Notes (Addendum)
CHIAKI, SWAINE ([id number]) Visit Report for 06/08/2019 Chief Complaint Document Details Patient Name: [name], [name]. Date of Service: 06/08/2019 8:30 AM Medical Record Number: [id number] Patient Account Number: [id number] Date of Birth/Sex: April 18, 1945 (74 y.o. Female) Treating RN: Montey Hora Primary Care Provider: Clarene Reamer Other Clinician: Referring Provider: Clarene Reamer Treating Provider/Extender: Melburn Hake, Kimarion Chery Weeks in Treatment: 3 Information Obtained from: Patient Chief Complaint Left LE and ankle ulcers Electronic Signature(s) Signed: 06/08/2019 8:38:35 AM By: Worthy Keeler PA-C Entered By: Worthy Keeler on 06/08/2019 08:38:35 Viscuso, [name] -------------------------------------------------------------------------------- Debridement Details Patient Name: [name] Date of Service: 06/08/2019 8:30 AM Medical Record Number: [id number] Patient Account Number: [id number] Date of Birth/Sex: 1946-01-03 (74 y.o. Female) Treating RN: Montey Hora Primary Care Provider: Clarene Reamer Other Clinician: Referring Provider: Clarene Reamer Treating Provider/Extender: Melburn Hake, Tishawna Larouche Weeks in Treatment: 3 Debridement Performed for Wound #1 Left,Medial Lower Leg Assessment: Performed By: Physician STONE III, Chaniya Genter E., PA-C Debridement Type: Debridement Level of Consciousness (Pre- Awake and Alert procedure): Pre-procedure Verification/Time Out Yes - 08:46 Taken: Start Time: 08:46 Pain Control: Lidocaine 4% Topical Solution Total Area Debrided (L x W): 1 (cm) x 1 (cm) = 1 (cm) Tissue and other material debrided: Viable, Non-Viable, Slough, Subcutaneous, Slough Level: Skin/Subcutaneous Tissue Debridement Description: Excisional Instrument: Curette Bleeding: Minimum Hemostasis Achieved: Pressure End Time: 08:47 Procedural Pain: 0 Post Procedural Pain: 0 Response to Treatment: Procedure was tolerated well Level of Consciousness  (Post- Awake and Alert procedure): Post Debridement Measurements of Total Wound Length: (cm) 6 Width: (cm) 4 Depth: (cm) 0.2 Volume: (cm) 3.77 Character of Wound/Ulcer Post Debridement: Improved Post Procedure Diagnosis Same as Pre-procedure Electronic Signature(s) Signed: 06/08/2019 2:58:07 PM By: Montey Hora Signed: 06/08/2019 3:07:58 PM By: Worthy Keeler PA-C Entered By: Montey Hora on 06/08/2019 08:47:55 [name], [name] -------------------------------------------------------------------------------- HPI Details Patient Name: [name]. Date of Service: 06/08/2019 8:30 AM Medical Record Number: [id number] Patient Account Number: [id number] Date of Birth/Sex: 03-20-1946 (74 y.o. Female) Treating RN: Montey Hora Primary Care Provider: Clarene Reamer Other Clinician: Referring Provider: Clarene Reamer Treating Provider/Extender: Melburn Hake, Nikan Ellingson Weeks in Treatment: 3 History of Present Illness HPI Description: 05/17/2019 upon evaluation today patient presents today for initial evaluation here in the clinic regarding her left lower extremity open wound secondary to lymphedema. She has previously been seen at the [facility] clinic 1 time in July 2020 but did not have any follow-up after that point. Subsequently she has not been seen here in [place] prior to today. Currently she tells me that she has been having issues for the past 1-2 months with wounds over the left lower extremity 2 that are more distinct she did has a breakdown of an area which is much more superficial as well on her leg but in general she mainly is dealing with what I would term stage III lymphedema. Fortunately there is no signs of active cellulitis she does have erythema but I think this is more stasis dermatitis than anything infectious in nature. Fortunately there is no evidence of systemic infection which is also good news. She does have a history of lymphedema though she is  never worn compression stockings she tells me. She does have hypertension as well. 05/25/2019 upon evaluation today patient appears to be doing about the same with regard to her wounds upon inspection today. Her edema is somewhat better but still with the larger calf muscle area and smaller area around her ankle it is very difficult to wrap  her appropriately without the sliding down. Patient really did not feel like it slid down too much nonetheless it did appear to slide quite a bit based on what we were seeing today. Last Thursday was the same. With that being said she still does need compression we did have a discussion between several of the nurses and myself today with the patient about what the best thing to do may be. 06/01/2019 upon evaluation today patient appears to be doing slightly better in regard to her leg at this point. There are still a lot of irritation but she does not seem to be having as much as much issue with the weeping as compared to last time still we are not really doing a whole lot better as of yet in general. 06/08/2019 upon evaluation today patient actually seems to be making excellent progress in regard to her wounds and her leg in general. The weeping has dramatically improved and overall very pleased with things appear today. There is no signs of active infection which is good news. No fevers, chills, nausea, vomiting, or diarrhea. Electronic Signature(s) Signed: 06/08/2019 8:51:25 AM By: Worthy Keeler PA-C Entered By: Worthy Keeler on 06/08/2019 08:51:24 [name] ([id number]) -------------------------------------------------------------------------------- Physical Exam Details Patient Name: [name]. Date of Service: 06/08/2019 8:30 AM Medical Record Number: [id number] Patient Account Number: [id number] Date of Birth/Sex: March 31, 1945 (74 y.o. Female) Treating RN: Montey Hora Primary Care Provider: Clarene Reamer Other Clinician: Referring  Provider: Clarene Reamer Treating Provider/Extender: Melburn Hake, Lionell Matuszak Weeks in Treatment: 3 Constitutional Obese and well-hydrated in no acute distress. Respiratory normal breathing without difficulty. Psychiatric this patient is able to make decisions and demonstrates good insight into disease process. Alert and Oriented x 3. pleasant and cooperative. Notes Patient's wound bed currently showed signs of good granulation at this point there was some slough noted on the more proximal opening at this time which she does have me convinced that we do need to perform some sharp debridement to clear with the necrotic debris that way this can heal more effectively. The patient is in agreement with that plan I did perform sharp debridement of her a small 1 cm x 1 cm area today which she tolerated without complication post debridement wound bed appears to be doing much better. Electronic Signature(s) Signed: 06/08/2019 8:51:59 AM By: Worthy Keeler PA-C Entered By: Worthy Keeler on 06/08/2019 08:51:59 [name] ([id number]) -------------------------------------------------------------------------------- Physician Orders Details Patient Name: [name] Date of Service: 06/08/2019 8:30 AM Medical Record Number: [id number] Patient Account Number: [id number] Date of Birth/Sex: 18-Oct-1945 (74 y.o. 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 Skin Barriers/Peri-Wound Care o Triamcinolone Acetonide Ointment (TCA) - to lower leg Primary Wound Dressing Wound #1 Left,Medial Lower Leg o Silver Alginate Secondary Dressing Wound #1 Left,Medial Lower Leg o ABD pad Dressing Change Frequency Wound #1 Left,Medial Lower Leg o Other: -  Twice weekly Follow-up Appointments Wound #1 Left,Medial Lower Leg o Return Appointment in 1 week. o Nurse Visit as needed - Thursday or Friday Edema Control Wound #1 Left,Medial Lower Leg o Unna Boot to Left Lower Extremity o Elevate legs to the level of the heart and pump ankles as often as possible o Other: - Tubi G at the top of patient's calf Electronic Signature(s) Signed: 06/08/2019 2:58:07 PM By: Montey Hora Signed: 06/08/2019 3:07:58 PM By: Worthy Keeler PA-C Entered By: Montey Hora on 06/08/2019 08:49:01 [name], [name] -------------------------------------------------------------------------------- Problem List Details Patient Name: Alyssa Reges C. Date of Service: 06/08/2019 8:30 AM Medical Record Number: [id number] Patient Account Number: [id number] Date of Birth/Sex: 06-21-1945 (74 y.o. Female) Treating RN: Montey Hora Primary Care Provider: Clarene Reamer Other Clinician: Referring Provider: Clarene Reamer Treating Provider/Extender: Melburn Hake, Garik Diamant Weeks in Treatment: 3 Active Problems ICD-10 Evaluated Encounter Code Description Active Date Today Diagnosis I89.0 Lymphedema, not elsewhere classified 05/17/2019 No Yes L97.822 Non-pressure chronic ulcer of other part of left lower leg with fat layer 05/17/2019 No Yes exposed L97.322 Non-pressure chronic ulcer of left ankle with fat layer exposed 05/17/2019 No Yes L97.821 Non-pressure chronic ulcer of other part of left lower leg  limited to 05/17/2019 No Yes breakdown of skin I10 Essential (primary) hypertension 05/17/2019 No Yes Inactive Problems Resolved Problems Electronic Signature(s) Signed: 06/08/2019 8:38:29 AM By: Worthy Keeler PA-C Entered By: Worthy Keeler on 06/08/2019 08:38:29 [name], [name] -------------------------------------------------------------------------------- Progress Note Details Patient Name: [name]. Date of Service: 06/08/2019 8:30 AM Medical Record Number: [id number] Patient Account Number: [id number] Date of Birth/Sex: 04/11/1945 (74 y.o. Female) Treating RN: Montey Hora Primary Care Provider: Clarene Reamer Other Clinician: Referring Provider: Clarene Reamer Treating Provider/Extender: Melburn Hake, Despina Boan Weeks in Treatment: 3 Subjective Chief Complaint Information obtained from Patient Left LE and ankle ulcers History of Present Illness (HPI) 05/17/2019 upon evaluation today patient presents today for initial evaluation here in the clinic regarding her left lower extremity open wound secondary to lymphedema. She has previously been seen at the [facility] clinic 1 time in July 2020 but did not have any follow-up after that point. Subsequently she has not been seen here in [place] prior to today. Currently she tells me that she has been having issues for the past 1-2 months with wounds over the left lower extremity 2 that are more distinct she did has a breakdown of an area which is much more superficial as well on her leg but in general she mainly is dealing with what I would term stage III lymphedema. Fortunately there is no signs of active cellulitis she does have erythema but I think this is more stasis dermatitis than anything infectious in nature. Fortunately there is no evidence of systemic infection which is also good news. She does have a history of lymphedema though she is never worn compression stockings she tells me. She does have hypertension  as well. 05/25/2019 upon evaluation today patient appears to be doing about the same with regard to her wounds upon inspection today. Her edema is somewhat better but still with the larger calf muscle area and smaller area around her ankle it is very difficult to wrap her appropriately without the sliding down. Patient really did not feel like it slid down too much nonetheless it did appear to slide quite a bit based on what we were seeing today. Last Thursday was the same. With that being said she still does need compression we did have  a discussion between several of the nurses and myself today with the patient about what the best thing to do may be. 06/01/2019 upon evaluation today patient appears to be doing slightly better in regard to her leg at this point. There are still a lot of irritation but she does not seem to be having as much as much issue with the weeping as compared to last time still we are not really doing a whole lot better as of yet in general. 06/08/2019 upon evaluation today patient actually seems to be making excellent progress in regard to her wounds and her leg in general. The weeping has dramatically improved and overall very pleased with things appear today. There is no signs of active infection which is good news. No fevers, chills, nausea, vomiting, or diarrhea. Objective Constitutional Obese and well-hydrated in no acute distress. Vitals Time Taken: 8:30 AM, Height: 62 in, Weight: 380 lbs, BMI: 69.5, Temperature: 97.6 F, Pulse: 66 bpm, Respiratory Rate: 18 breaths/min, Blood Pressure: 129/67 mmHg. Respiratory normal breathing without difficulty. Psychiatric this patient is able to make decisions and demonstrates good insight into disease process. Alert and Oriented x 3. pleasant and cooperative. General Notes: Patient's wound bed currently showed signs of good granulation at this point there was some slough noted on the more proximal opening at this time which she  does have me convinced that we do need to perform some sharp debridement to clear with the necrotic debris that way this can heal more effectively. The patient is in agreement with that plan I did perform sharp debridement of her a small 1 cm x 1 cm area today which she tolerated without complication post debridement wound bed appears to be doing much better. [name], [name] Integumentary (Hair, Skin) Wound #1 status is Open. Original cause of wound was Gradually Appeared. The wound is located on the Left,Medial Lower Leg. The wound measures 6cm length x 4cm width x 0.1cm depth; 18.85cm^2 area and 1.885cm^3 volume. There is Fat Layer (Subcutaneous Tissue) Exposed exposed. There is no tunneling or undermining noted. There is a large amount of serous drainage noted. The wound margin is indistinct and nonvisible. There is large (67-100%) red granulation within the wound bed. There is a small (1-33%) amount of necrotic tissue within the wound bed including Adherent Slough. 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 There was a Excisional Skin/Subcutaneous Tissue Debridement with a total area of 1 sq cm performed by STONE III, Yakir Wenke E., PA-C. With the following instrument(s): Curette to remove Viable and Non-Viable tissue/material. Material removed includes Subcutaneous Tissue and Slough and after achieving pain control using Lidocaine 4% Topical Solution. No specimens were taken. A time out was conducted at 08:46, prior to the start of the procedure. A Minimum amount of bleeding  was controlled with Pressure. The procedure was tolerated well with a pain level of 0 throughout and a pain level of 0 following the procedure. Post Debridement Measurements: 6cm length x 4cm width x 0.2cm depth; 3.77cm^3 volume. Character of Wound/Ulcer Post Debridement is improved. Post procedure Diagnosis Wound #1: Same as Pre-Procedure Pre-procedure diagnosis of Wound #1 is a Lymphedema  located on the Left,Medial Lower Leg . There was a Haematologist Compression Therapy Procedure with a pre-treatment ABI of 1.2 by Montey Hora, RN. Post procedure Diagnosis Wound #1: Same as Pre-Procedure Plan Wound Cleansing: Wound #1 Left,Medial Lower Leg: Cleanse wound with mild soap and water - in office May shower with protection. Skin Barriers/Peri-Wound Care: Triamcinolone Acetonide Ointment (TCA) - to lower leg Primary Wound Dressing: Wound #1 Left,Medial Lower Leg: Silver Alginate Secondary Dressing: Wound #1 Left,Medial Lower Leg: ABD pad Dressing Change Frequency: Wound #1 Left,Medial Lower Leg: Other: - Twice weekly Follow-up Appointments: Wound #1 Left,Medial Lower Leg: Return Appointment in 1 week. Nurse Visit as needed - Thursday or Friday [name], [name] ([id number]) Edema Control: Wound #1 Left,Medial Lower Leg: Unna Boot to Left Lower Extremity Elevate legs to the level of the heart and pump ankles as often as possible Other: - Tubi G at the top of patient's calf 1 my suggestion at this time is can be that we going to continue with the current wound care measures using the silver alginate dressing over the open areas which is the appropriate thing to do in my opinion. 2. I am also can recommend that we continue with the Unna boot to the lower part of the left lower extremity and that we will continue to use Tubigrip over the upper. I do want her to apply some kind of lotion to the upper part twice a day in order to try to keep this moisturized that she has been having a lot  of itching underneath the Tubigrip. She states she would definitely do that over the next week. 3. I am also going to suggest she continue to elevate her legs is much as possible obviously more she can elevate the better off she will do. We will see patient back for reevaluation in 1 week here in the clinic. If anything worsens or changes patient will contact our office for additional recommendations. Electronic Signature(s) Signed: 06/08/2019 8:52:41 AM By: Worthy Keeler PA-C Entered By: Worthy Keeler on 06/08/2019 08:52:41 [name], [name] ([id number]) -------------------------------------------------------------------------------- SuperBill Details Patient Name: [name] Date of Service: 06/08/2019 Medical Record Number: [id number] Patient Account Number: [id number] Date of Birth/Sex: 11-09-1945 (74 y.o. Female) Treating RN: Montey Hora Primary Care Provider: Clarene Reamer Other Clinician: Referring Provider: Clarene Reamer Treating Provider/Extender: Melburn Hake, Laquinta Hazell Weeks in Treatment: 3 Diagnosis Coding ICD-10 Codes Code Description I89.0 Lymphedema, not elsewhere classified L97.822 Non-pressure chronic ulcer of other part of left lower leg with fat layer exposed L97.322 Non-pressure chronic ulcer of left ankle with fat layer exposed L97.821 Non-pressure chronic ulcer of other part of left lower leg limited to breakdown of skin I10 Essential (primary) hypertension Facility Procedures CPT4 Code: [id number] Description: [id number] - DEB SUBQ TISSUE 20 SQ CM/< Modifier: Quantity: 1 CPT4 Code: Description: ICD-10 Diagnosis Description L97.822 Non-pressure chronic ulcer of other part of left lower leg with fat layer ex Modifier: posed Quantity: Physician Procedures CPT4 Code: [id number] Description: 11042 - WC PHYS SUBQ TISS 20 SQ CM Modifier: Quantity: 1 CPT4 Code: Description: ICD-10 Diagnosis Description L97.822 Non-pressure chronic ulcer of other part of left  lower leg with fat layer ex Modifier: posed Quantity: Electronic Signature(s) Signed: 06/08/2019 8:52:56 AM By: Worthy Keeler PA-C Entered By: Worthy Keeler on 06/08/2019 08:52:56

## 2019-06-09 NOTE — Progress Notes (Signed)
RADIANCE, DEVOTO ([id number]) Visit Report for 06/08/2019 Arrival Information Details Patient Name: [name], [name]. Date of Service: 06/08/2019 8:30 AM Medical Record Number: [id number] Patient Account Number: [id number] Date of Birth/Sex: 01-03-1946 (74 y.o. Female) Treating RN: Cornell Barman Primary Care Shanequa Whitenight: Clarene Reamer Other Clinician: Referring Makayli Bracken: Clarene Reamer Treating Ubah Radke/Extender: Melburn Hake, HOYT Weeks in Treatment: 3 Visit Information History Since Last Visit Has Dressing in Place as Prescribed: Yes Patient Arrived: Ambulatory Pain Present Now: No Arrival Time: 08:28 Accompanied By: self Transfer Assistance: None Patient Identification Verified: Yes Secondary Verification Process Completed: Yes Electronic Signature(s) Signed: 06/09/2019 9:51:29 AM By: Gretta Cool, BSN, RN, CWS, Kim RN, BSN Entered By: Gretta Cool, BSN, RN, CWS, Kim on 06/08/2019 08:29:13 [name] ([id number]) -------------------------------------------------------------------------------- Compression Therapy Details Patient Name: [name] Date of Service: 06/08/2019 8:30 AM Medical Record Number: [id number] Patient Account Number: [id number] Date of Birth/Sex: 07-Aug-1945 (74 y.o. Female) Treating RN: Montey Hora Primary Care Karah Caruthers: Clarene Reamer Other Clinician: Referring Hyla Coard: Clarene Reamer Treating Akelia Husted/Extender: Melburn Hake, HOYT Weeks in Treatment: 3 Compression Therapy Performed for Wound Assessment: Wound #1 Left,Medial Lower Leg Performed By: Clinician Montey Hora, RN Compression Type: Rolena Infante Pre Treatment ABI: 1.2 Post Procedure Diagnosis Same as Pre-procedure Electronic Signature(s) Signed: 06/08/2019 2:58:07 PM By: Montey Hora Entered By: Montey Hora on 06/08/2019 08:48:25 Januszewski, [name] ([id number]) -------------------------------------------------------------------------------- Encounter Discharge Information Details Patient Name:  [name]. Date of Service: 06/08/2019 8:30 AM Medical Record Number: [id number] Patient Account Number: [id number] Date of Birth/Sex: Nov 25, 1945 (74 y.o. Female) Treating RN: Montey Hora Primary Care Clorine Swing: Clarene Reamer Other Clinician: Referring Annette Liotta: Clarene Reamer Treating Mileydi Milsap/Extender: Melburn Hake, HOYT Weeks in Treatment: 3 Encounter Discharge Information Items Post Procedure Vitals Discharge Condition: Stable Temperature (F): 97.6 Ambulatory Status: Ambulatory Pulse (bpm): 66 Discharge Destination: Home Respiratory Rate (breaths/min): 16 Transportation: Private Auto Blood Pressure (mmHg): 129/67 Accompanied By: self Schedule Follow-up Appointment: Yes Clinical Summary of Care: Electronic Signature(s) Signed: 06/08/2019 2:58:07 PM By: Montey Hora Entered By: Montey Hora on 06/08/2019 08:49:43 Toppin, [name] ([id number]) -------------------------------------------------------------------------------- Lower Extremity Assessment Details Patient Name: [name]. Date of Service: 06/08/2019 8:30 AM Medical Record Number: [id number] Patient Account Number: [id number] Date of Birth/Sex: 1945-05-31 (74 y.o. Female) Treating RN: Cornell Barman Primary Care Kedric Bumgarner: Clarene Reamer Other Clinician: Referring Harlan Ervine: Clarene Reamer Treating Evella Kasal/Extender: Melburn Hake, HOYT Weeks in Treatment: 3 Edema Assessment Assessed: [Left: No] [Right: No] [Left: Edema] [Right: :] Calf Left: Right: Point of Measurement: 32 cm From Medial Instep 57 cm cm Ankle Left: Right: Point of Measurement: 10 cm From Medial Instep 28.5 cm cm Vascular Assessment Pulses: Dorsalis Pedis Palpable: [Left:Yes] Electronic Signature(s) Signed: 06/09/2019 9:51:29 AM By: Gretta Cool, BSN, RN, CWS, Kim RN, BSN Entered By: Gretta Cool, BSN, RN, CWS, Kim on 06/08/2019 08:37:08 Pilling, [name]  ([id number]) -------------------------------------------------------------------------------- Multi Wound Chart Details Patient Name: [name]. Date of Service: 06/08/2019 8:30 AM Medical Record Number: [id number] Patient Account Number: [id number] Date of Birth/Sex: 03-14-1946 (74 y.o. Female) Treating RN: Montey Hora Primary Care Gunnar Hereford: Clarene Reamer Other Clinician: Referring Sagal Gayton: Clarene Reamer Treating Delaynee Alred/Extender: Melburn Hake, HOYT Weeks in Treatment: 3 Vital Signs Height(in): 62 Pulse(bpm): 35 Weight(lbs): 380 Blood Pressure(mmHg): 129/67 Body Mass Index(BMI): 69 Temperature(F): 97.6 Respiratory Rate(breaths/min): 18 Photos: [N/A:N/A] Wound Location: Left Lower Leg - Medial N/A N/A Wounding Event: Gradually Appeared N/A N/A Primary Etiology: Lymphedema N/A N/A Comorbid History: Hypertension N/A N/A Date Acquired: 08/24/2018 N/A N/A Weeks of Treatment: 3 N/A N/A Wound Status: Open N/A N/A  Measurements L x W x D (cm) 6x4x0.1 N/A N/A Area (cm) : 18.85 N/A N/A Volume (cm) : 1.885 N/A N/A % Reduction in Area: -3647.50% N/A N/A % Reduction in Volume: -1148.30% N/A N/A Classification: Full Thickness Without Exposed N/A N/A Support Structures Exudate Amount: Large N/A N/A Exudate Type: Serous N/A N/A Exudate Color: amber N/A N/A Wound Margin: Indistinct, nonvisible N/A N/A Granulation Amount: Large (67-100%) N/A N/A Granulation Quality: Red N/A N/A Necrotic Amount: Small (1-33%) N/A N/A Exposed Structures: Fat Layer (Subcutaneous Tissue) N/A N/A Exposed: Yes Fascia: No Tendon: No Muscle: No Joint: No Bone: No Epithelialization: Medium (34-66%) N/A N/A Treatment Notes Electronic Signature(s) Signed: 06/08/2019 2:58:07 PM By: Montey Hora Entered By: Montey Hora on 06/08/2019 08:44:29 Manfred, [name] ([id number]) [name], [name] -------------------------------------------------------------------------------- [place] Details Patient Name: [name]. Date of Service: 06/08/2019 8:30 AM Medical Record Number: [id number] Patient Account Number: [id number] Date of Birth/Sex: Aug 22, 1945 (74 y.o. Female) Treating RN: Montey Hora Primary Care Beatris Belen: Clarene Reamer Other Clinician: Referring Steaven Wholey: Clarene Reamer Treating Mesha Schamberger/Extender: Melburn Hake, HOYT Weeks in Treatment: 3 Active Inactive Orientation to the Wound Care Program Nursing Diagnoses: Knowledge deficit related to the wound healing center program Goals: Patient/caregiver will verbalize understanding of the [facility] Date Initiated: 05/17/2019 Target Resolution Date: 06/04/2019 Goal Status: Active Interventions: Provide education on orientation to the wound center Notes: Venous Leg Ulcer Nursing Diagnoses: Knowledge deficit related to disease process and management Goals: Patient will maintain optimal edema control Date Initiated: 06/08/2019 Target Resolution Date: 08/28/2019 Goal Status: Active Interventions: Compression as ordered Notes: Wound/Skin Impairment Nursing Diagnoses: Impaired tissue integrity Goals: Ulcer/skin breakdown will have a volume reduction of 30% by week 4 Date Initiated: 05/17/2019 Target Resolution Date: 06/05/2019 Goal Status: Active Interventions: Assess ulceration(s) every visit Notes: Electronic Signature(s) Signed: 06/08/2019 2:58:07 PM By: Montey Hora Entered By: Montey Hora on 06/08/2019 08:44:07 Harjo, [name] ([id number]) Shambaugh, [name] ([id number]) -------------------------------------------------------------------------------- Pain Assessment Details Patient Name: [name]. Date of Service: 06/08/2019 8:30 AM Medical Record Number: [id number] Patient Account Number: [id number] Date of Birth/Sex: 13-Mar-1946 (73 y.o.  Female) Treating RN: Cornell Barman Primary Care Jarian Longoria: Clarene Reamer Other Clinician: Referring Lochlyn Zullo: Clarene Reamer Treating Alayjah Boehringer/Extender: Melburn Hake, HOYT Weeks in Treatment: 3 Active Problems Location of Pain Severity and Description of Pain Patient Has Paino No Site Locations Pain Management and Medication Current Pain Management: Notes Patient denies pain at this time. Electronic Signature(s) Signed: 06/09/2019 9:51:29 AM By: Gretta Cool, BSN, RN, CWS, Kim RN, BSN Entered By: Gretta Cool, BSN, RN, CWS, Kim on 06/08/2019 08:29:34 [name] -------------------------------------------------------------------------------- Patient/Caregiver Education Details Patient Name: [name] Date of Service: 06/08/2019 8:30 AM Medical Record Number: [id number] Patient Account Number: [id number] Date of Birth/Gender: May 18, 1945 (74 y.o. Female) Treating RN: Montey Hora Primary Care Physician: Clarene Reamer Other Clinician: Referring Physician: Clarene Reamer Treating Physician/Extender: Sharalyn Ink in Treatment: 3 Education Assessment Education Provided To: Patient Education Topics Provided Venous: Handouts: Other: need for ongoing compression Methods: Demonstration, Explain/Verbal Responses: State content correctly Electronic Signature(s) Signed: 06/08/2019 2:58:07 PM By: Montey Hora Entered By: Montey Hora on 06/08/2019 08:45:39 Mccumbers, [name] ([id number]) -------------------------------------------------------------------------------- Wound Assessment Details Patient Name: [name] Date of Service: 06/08/2019 8:30 AM Medical Record Number: [id number] Patient Account Number: [id number] Date of Birth/Sex: 1945-07-03 (74 y.o. Female) Treating RN: Cornell Barman Primary Care Milianna Ericsson: Clarene Reamer Other Clinician: Referring Dannetta Lekas: Clarene Reamer Treating Kolton Kienle/Extender: Melburn Hake, HOYT Weeks in Treatment: 3 Wound  Status Wound Number: 1 Primary Etiology: Lymphedema Wound Location: Left Lower Leg - Medial Wound Status: Open Wounding Event: Gradually Appeared Comorbid History: Hypertension Date Acquired: 08/24/2018 Weeks Of Treatment: 3 Clustered Wound: No Photos Wound Measurements Length: (cm) 6 Width: (cm) 4 Depth: (cm) 0.1 Area: (cm) 18.85 Volume: (cm) 1.885 % Reduction in Area: -3647.5% % Reduction in Volume: -1148.3% Epithelialization: Medium (34-66%) Tunneling: No Undermining: No Wound Description Classification: Full Thickness Without Exposed Support Struc Wound Margin: Indistinct, nonvisible Exudate Amount: Large Exudate Type: Serous Exudate Color: amber tures Foul Odor After Cleansing: No Slough/Fibrino Yes Wound Bed Granulation Amount: Large (67-100%) Exposed Structure Granulation Quality: Red Fascia Exposed: No Necrotic Amount: Small (1-33%) Fat Layer (Subcutaneous Tissue) Exposed: Yes Necrotic Quality: Adherent Slough Tendon Exposed: No Muscle Exposed: No Joint Exposed: No Bone Exposed: No Treatment Notes Wound #1 (Left, Medial Lower Leg) Notes silver cell, ABD, unna boot with tubi g at the [employer]) Signed: 06/09/2019 9:51:29 AM By: Gretta Cool, BSN, RN, CWS, Kim RN, BSN Croker, Lyle ([id number]) Entered By: Gretta Cool, BSN, RN, CWS, Kim on 06/08/2019 08:35:54 Ruybal, [name] ([id number]) -------------------------------------------------------------------------------- Vitals Details Patient Name: [name] Date of Service: 06/08/2019 8:30 AM Medical Record Number: [id number] Patient Account Number: [id number] Date of Birth/Sex: 14-Oct-1945 (74 y.o. Female) Treating RN: Cornell Barman Primary Care Verenis Nicosia: Clarene Reamer Other Clinician: Referring Jeno Calleros: Clarene Reamer Treating Meziah Blasingame/Extender: Melburn Hake, HOYT Weeks in Treatment: 3 Vital Signs Time Taken: 08:30 Temperature (F): 97.6 Height (in): 62 Pulse (bpm): 66 Weight (lbs):  380 Respiratory Rate (breaths/min): 18 Body Mass Index (BMI): 69.5 Blood Pressure (mmHg): 129/67 Reference Range: 80 - 120 mg / dl Electronic Signature(s) Signed: 06/09/2019 9:51:29 AM By: Gretta Cool, BSN, RN, CWS, Kim RN, BSN Entered By: Gretta Cool, BSN, RN, CWS, Kim on 06/08/2019 08:34:47

## 2019-06-11 ENCOUNTER — Other Ambulatory Visit: Payer: Self-pay

## 2019-06-11 DIAGNOSIS — L97821 Non-pressure chronic ulcer of other part of left lower leg limited to breakdown of skin: Secondary | ICD-10-CM | POA: Diagnosis not present

## 2019-06-11 DIAGNOSIS — Z6841 Body Mass Index (BMI) 40.0 and over, adult: Secondary | ICD-10-CM | POA: Diagnosis not present

## 2019-06-11 DIAGNOSIS — I890 Lymphedema, not elsewhere classified: Secondary | ICD-10-CM | POA: Diagnosis not present

## 2019-06-11 DIAGNOSIS — L97822 Non-pressure chronic ulcer of other part of left lower leg with fat layer exposed: Secondary | ICD-10-CM | POA: Diagnosis not present

## 2019-06-11 DIAGNOSIS — I10 Essential (primary) hypertension: Secondary | ICD-10-CM | POA: Diagnosis not present

## 2019-06-11 DIAGNOSIS — L97322 Non-pressure chronic ulcer of left ankle with fat layer exposed: Secondary | ICD-10-CM | POA: Diagnosis not present

## 2019-06-11 NOTE — Progress Notes (Signed)
[name] Visit Report for 06/11/2019 Arrival Information Details Patient Name: [name], [name]. Date of Service: 06/11/2019 8:00 AM Medical Record Number: [id number] Patient Account Number: [id number] Date of Birth/Sex: 02/12/1946 (74 y.o. F) Treating RN: Montey Hora Primary Care [name]: [name] Other Clinician: Referring [name]: [name] Treating [name]/Extender: [name], [name] in Treatment: 3 Visit Information History Since Last Visit Added or deleted any medications: No Patient Arrived: Ambulatory Any new allergies or adverse reactions: No Arrival Time: 08:03 Had a fall or experienced change in No Accompanied By: self activities of daily living that may affect Transfer Assistance: None risk of falls: Patient Identification Verified: Yes Signs or symptoms of abuse/neglect since last visito No Secondary Verification Process Completed: Yes Hospitalized since last visit: No Implantable device outside of the clinic excluding No cellular tissue based products placed in the center since last visit: Has Dressing in Place as Prescribed: Yes Has Compression in Place as Prescribed: Yes Pain Present Now: No Notes 98.2 Electronic Signature(s) Signed: 06/11/2019 8:28:09 AM By: Montey Hora Entered By: Montey Hora on 06/11/2019 08:28:08 [name], [name] -------------------------------------------------------------------------------- Compression Therapy Details Patient Name: [name]. Date of Service: 06/11/2019 8:00 AM Medical Record Number: [id number] Patient Account Number: [id number] Date of Birth/Sex: [birth year] (74 y.o. F) Treating RN: Montey Hora Primary Care [name]: [name] Other Clinician: Referring [name]: [name] Treating [name]/Extender: [name], [name] in Treatment: 3 Compression Therapy Performed for Wound Assessment: Wound #1 Left,Medial Lower Leg Performed By:  Clinician Montey Hora, RN Compression Type: Rolena Infante Pre Treatment ABI: 1.2 Electronic Signature(s) Signed: 06/11/2019 8:28:50 AM By: Montey Hora Entered By: Montey Hora on 06/11/2019 08:28:50 [name], [name] -------------------------------------------------------------------------------- Encounter Discharge Information Details Patient Name: [name]. Date of Service: 06/11/2019 8:00 AM Medical Record Number: [id number] Patient Account Number: [id number] Date of Birth/Sex: 09/03/1945 (74 y.o. F) Treating RN: Montey Hora Primary Care [name]: [name] Other Clinician: Referring [name]: [name] Treating [name]/Extender: [name] in Treatment: 3 Encounter Discharge Information Items Discharge Condition: Stable Ambulatory Status: Ambulatory Discharge Destination: Home Transportation: Private Auto Accompanied By: self Schedule Follow-up Appointment: No Clinical Summary of Care: Electronic Signature(s) Signed: 06/11/2019 8:29:17 AM By: Montey Hora Entered By: Montey Hora on 06/11/2019 08:29:16 [name], [name] -------------------------------------------------------------------------------- Wound Assessment Details Patient Name: [name]. Date of Service: 06/11/2019 8:00 AM Medical Record Number: [id number] Patient Account Number: [id number] Date of Birth/Sex: [birth year] (74 y.o. F) Treating RN: Montey Hora Primary Care [name]: [name] Other Clinician: Referring [name]: [name] Treating [name]/Extender: [name], [name] in Treatment: 3 Wound Status Wound Number: 1 Primary Etiology: Lymphedema Wound Location: Left Lower Leg - Medial Wound Status: Open Wounding Event: Gradually Appeared Comorbid History: Hypertension Date Acquired: 08/24/2018 [name] Of Treatment: 3 Clustered Wound: No Wound Measurements Length: (cm) 6 Width: (cm) 4 Depth: (cm) 0.2 Area:  (cm) 18.85 Volume: (cm) 3.77 % Reduction in Area: -3647.5% % Reduction in Volume: -2396.7% Epithelialization: Medium (34-66%) Tunneling: No Undermining: No Wound Description Classification: Full Thickness Without Exposed Support Struct Wound Margin: Indistinct, nonvisible Exudate Amount: Large Exudate Type: Serous Exudate Color: amber ures Foul Odor After Cleansing: No Slough/Fibrino Yes Wound Bed Granulation Amount: Large (67-100%) Exposed Structure Granulation Quality: Red Fascia Exposed: No Necrotic Amount: Small (1-33%) Fat Layer (Subcutaneous Tissue) Exposed: Yes Necrotic Quality: Adherent Slough Tendon Exposed: No Muscle Exposed: No Joint Exposed: No Bone Exposed: No Treatment Notes Wound #1 (Left, Medial Lower Leg) Notes silver cell, ABD,  unna boot with tubi g at the top Electronic Signature(s) Signed: 06/11/2019 8:28:31 AM By: Montey Hora Entered By: Montey Hora on 06/11/2019 08:28:31

## 2019-06-14 ENCOUNTER — Encounter: Payer: Self-pay | Admitting: Family Medicine

## 2019-06-14 MED ORDER — HYDROCHLOROTHIAZIDE 25 MG PO TABS: 25.0000 mg | ORAL_TABLET | Freq: Every day | ORAL | 1 refills | Status: DC

## 2019-06-15 ENCOUNTER — Encounter: Payer: Medicare Other | Admitting: Physician Assistant

## 2019-06-15 ENCOUNTER — Other Ambulatory Visit: Payer: Self-pay

## 2019-06-15 DIAGNOSIS — L97821 Non-pressure chronic ulcer of other part of left lower leg limited to breakdown of skin: Secondary | ICD-10-CM | POA: Diagnosis not present

## 2019-06-15 DIAGNOSIS — I10 Essential (primary) hypertension: Secondary | ICD-10-CM | POA: Diagnosis not present

## 2019-06-15 DIAGNOSIS — L97822 Non-pressure chronic ulcer of other part of left lower leg with fat layer exposed: Secondary | ICD-10-CM | POA: Diagnosis not present

## 2019-06-15 DIAGNOSIS — Z6841 Body Mass Index (BMI) 40.0 and over, adult: Secondary | ICD-10-CM | POA: Diagnosis not present

## 2019-06-15 DIAGNOSIS — L97322 Non-pressure chronic ulcer of left ankle with fat layer exposed: Secondary | ICD-10-CM | POA: Diagnosis not present

## 2019-06-15 DIAGNOSIS — I890 Lymphedema, not elsewhere classified: Secondary | ICD-10-CM | POA: Diagnosis not present

## 2019-06-17 NOTE — Progress Notes (Signed)
Alyssa, [name] ([id number]) Visit Report for 06/15/2019 Arrival Information Details Patient Name: Alyssa, [name]. Date of Service: 06/15/2019 8:00 AM Medical Record Number: [id number] Patient Account Number: [id number] Date of Birth/Sex: 22-Dec-1945 (73 y.o. F) Treating RN: Army Melia Primary Care Candia Kingsbury: Clarene Reamer Other Clinician: Referring Janessa Mickle: Clarene Reamer Treating Fatema Rabe/Extender: Melburn Hake, HOYT Weeks in Treatment: 4 Visit Information History Since Last Visit Added or deleted any medications: No Patient Arrived: Ambulatory Any new allergies or adverse reactions: No Arrival Time: 08:07 Had a fall or experienced change in No Accompanied By: self activities of daily living that may affect Transfer Assistance: None risk of falls: Patient Identification Verified: Yes Signs or symptoms of abuse/neglect since last visito No Hospitalized since last visit: No Has Dressing in Place as Prescribed: Yes Pain Present Now: No Electronic Signature(s) Signed: 06/15/2019 4:13:28 PM By: Army Melia Entered By: Army Melia on 06/15/2019 08:07:21 [name], [name] ([id number]) -------------------------------------------------------------------------------- Compression Therapy Details Patient Name: [name]. Date of Service: 06/15/2019 8:00 AM Medical Record Number: [id number] Patient Account Number: [id number] Date of Birth/Sex: [birth year] (73 y.o. F) Treating RN: Montey Hora Primary Care Jordis Repetto: Clarene Reamer Other Clinician: Referring Nuriyah Hanline: Clarene Reamer Treating Rudi Bunyard/Extender: Melburn Hake, HOYT Weeks in Treatment: 4 Compression Therapy Performed for Wound Assessment: Wound #1 Left,Medial Lower Leg Performed By: Clinician Montey Hora, RN Compression Type: Rolena Infante Pre Treatment ABI: 1.2 Post Procedure Diagnosis Same as Pre-procedure Electronic Signature(s) Signed: 06/15/2019 4:41:39 PM By: Montey Hora Entered By: Montey Hora on  06/15/2019 08:29:56 [name], [name] ([id number]) -------------------------------------------------------------------------------- Encounter Discharge Information Details Patient Name: [name]. Date of Service: 06/15/2019 8:00 AM Medical Record Number: [id number] Patient Account Number: [id number] Date of Birth/Sex: 21-Jul-1945 (73 y.o. F) Treating RN: Montey Hora Primary Care Mekenna Finau: Clarene Reamer Other Clinician: Referring Karmel Patricelli: Clarene Reamer Treating Frederic Tones/Extender: Sharalyn Ink in Treatment: 4 Encounter Discharge Information Items Discharge Condition: Stable Ambulatory Status: Ambulatory Discharge Destination: Home Transportation: Private Auto Accompanied By: self Schedule Follow-up Appointment: Yes Clinical Summary of Care: Electronic Signature(s) Signed: 06/15/2019 4:41:39 PM By: Montey Hora Entered By: Montey Hora on 06/15/2019 08:32:40 [name], [name] ([id number]) -------------------------------------------------------------------------------- Lower Extremity Assessment Details Patient Name: [name]. Date of Service: 06/15/2019 8:00 AM Medical Record Number: [id number] Patient Account Number: [id number] Date of Birth/Sex: [birth year] (73 y.o. F) Treating RN: Army Melia Primary Care Zeke Aker: Clarene Reamer Other Clinician: Referring Bracken Moffa: Clarene Reamer Treating Naomie Crow/Extender: Melburn Hake, HOYT Weeks in Treatment: 4 Edema Assessment Assessed: [Left: No] [Right: No] Edema: [Left: Ye] [Right: s] Calf Left: Right: Point of Measurement: 32 cm From Medial Instep 53 cm cm Ankle Left: Right: Point of Measurement: 10 cm From Medial Instep 27 cm cm Vascular Assessment Pulses: Dorsalis Pedis Palpable: [Left:Yes] Electronic Signature(s) Signed: 06/15/2019 4:13:28 PM By: Army Melia Entered By: Army Melia on 06/15/2019 08:11:47 [name], [name] -------------------------------------------------------------------------------- Multi Wound Chart Details Patient Name: [name]. Date of Service: 06/15/2019 8:00 AM Medical Record Number: [id number] Patient Account Number: [id number] Date of Birth/Sex: [birth year] (73 y.o. F) Treating RN: Montey Hora Primary Care Loralai Eisman: Clarene Reamer Other Clinician: Referring Tyric Rodeheaver: Clarene Reamer Treating Teneisha Gignac/Extender: Melburn Hake, HOYT Weeks in Treatment: 4 Vital Signs Height(in): 62 Pulse(bpm): 71 Weight(lbs): 380 Blood Pressure(mmHg): 156/65 Body Mass Index(BMI): 69 Temperature(F): 98.0 Respiratory Rate(breaths/min): 16 Photos: [N/A:N/A] Wound Location: Left Lower Leg - Medial N/A N/A Wounding Event: Gradually Appeared N/A N/A Primary Etiology: Lymphedema N/A N/A Comorbid History: Hypertension N/A N/A Date Acquired: 08/24/2018 N/A N/A Weeks of Treatment:  4 N/A N/A Wound Status: Open N/A N/A Measurements L x W x D (cm) 6.5x4x0.2 N/A N/A Area (cm) : 20.42 N/A N/A Volume (cm) : 4.084 N/A N/A % Reduction in Area: -3959.60% N/A N/A % Reduction in Volume: -2604.60% N/A N/A Classification: Full Thickness Without Exposed N/A N/A Support Structures Exudate Amount: Large N/A N/A Exudate Type: Serous N/A N/A Exudate Color: amber N/A N/A Wound Margin: Indistinct, nonvisible N/A N/A Granulation Amount: Large (67-100%) N/A N/A Granulation Quality: Red N/A N/A Necrotic Amount: Small (1-33%) N/A N/A Exposed Structures: Fat Layer (Subcutaneous Tissue) N/A N/A Exposed: Yes Fascia: No Tendon: No Muscle: No Joint: No Bone: No Epithelialization: Medium (34-66%) N/A N/A Treatment Notes Electronic Signature(s) Signed: 06/15/2019 4:41:39 PM By: Montey Hora Entered By: Montey Hora on 06/15/2019 08:29:31 [name], [name] ([id number]) [name], [name] -------------------------------------------------------------------------------- [place] Details Patient Name: [name]. Date of Service: 06/15/2019 8:00 AM Medical Record Number: [id number] Patient Account Number: [id number] Date of Birth/Sex: 1945/09/30 (73 y.o. F) Treating RN: Montey Hora Primary Care Serafina Topham: Clarene Reamer Other Clinician: Referring Eliannah Hinde: Clarene Reamer Treating Akaisha Truman/Extender: Melburn Hake, HOYT Weeks in Treatment: 4 Active Inactive Orientation to the Wound Care Program Nursing Diagnoses: Knowledge deficit related to the wound healing center program Goals: Patient/caregiver will verbalize understanding of the [facility] Program Date Initiated: 05/17/2019 Target Resolution Date: 06/04/2019 Goal Status: Active Interventions: Provide education on orientation to the wound center Notes: Venous Leg Ulcer Nursing Diagnoses: Knowledge deficit related to disease process and management Goals: Patient will maintain optimal edema control Date Initiated: 06/08/2019 Target Resolution Date: 08/28/2019 Goal Status: Active Interventions: Compression as ordered Notes: Wound/Skin Impairment Nursing Diagnoses: Impaired tissue integrity Goals: Ulcer/skin breakdown will have a volume reduction of 30% by week 4 Date Initiated: 05/17/2019 Target Resolution Date: 06/05/2019 Goal Status: Active Interventions: Assess ulceration(s) every visit Notes: Electronic Signature(s) Signed: 06/15/2019 4:41:39 PM By: Montey Hora Entered By: Montey Hora on 06/15/2019 08:29:07 Mullally, [name] ([id number]) Gerrard, [name] ([id number]) -------------------------------------------------------------------------------- Pain Assessment Details Patient Name: [name]. Date of Service: 06/15/2019 8:00 AM Medical Record Number: [id number] Patient Account Number: [id number] Date of Birth/Sex: [birth year] (73 y.o. F) Treating  RN: Army Melia Primary Care Kinte Trim: Clarene Reamer Other Clinician: Referring Zykeria Laguardia: Clarene Reamer Treating Emberleigh Reily/Extender: Melburn Hake, HOYT Weeks in Treatment: 4 Active Problems Location of Pain Severity and Description of Pain Patient Has Paino No Site Locations Pain Management and Medication Current Pain Management: Electronic Signature(s) Signed: 06/15/2019 4:13:28 PM By: Army Melia Entered By: Army Melia on 06/15/2019 08:07:42 Lorusso, [name] ([id number]) -------------------------------------------------------------------------------- Patient/Caregiver Education Details Patient Name: [name]. Date of Service: 06/15/2019 8:00 AM Medical Record Number: [id number] Patient Account Number: [id number] Date of Birth/Gender: October 29, 1945 (73 y.o. F) Treating RN: Montey Hora Primary Care Physician: Clarene Reamer Other Clinician: Referring Physician: Clarene Reamer Treating Physician/Extender: Sharalyn Ink in Treatment: 4 Education Assessment Education Provided To: Patient Education Topics Provided Venous: Handouts: Other: leg elevation Methods: Explain/Verbal Responses: State content correctly Electronic Signature(s) Signed: 06/15/2019 4:41:39 PM By: Montey Hora Entered By: Montey Hora on 06/15/2019 08:31:49 Cork, Kareemah Loletha Grayer ([id number]) -------------------------------------------------------------------------------- Wound Assessment Details Patient Name: [name] Date of Service: 06/15/2019 8:00 AM Medical Record Number: [id number] Patient Account Number: [id number] Date of Birth/Sex: 11-Sep-1945 (73 y.o. F) Treating RN: Army Melia Primary Care Alexx Mcburney: Clarene Reamer Other Clinician: Referring Yeng Frankie: Clarene Reamer Treating Constanza Mincy/Extender: Melburn Hake, HOYT Weeks in Treatment: 4 Wound Status Wound Number: 1 Primary Etiology: Lymphedema Wound Location: Left  Lower Leg - Medial Wound Status: Open Wounding  Event: Gradually Appeared Comorbid History: Hypertension Date Acquired: 08/24/2018 Weeks Of Treatment: 4 Clustered Wound: No Photos Wound Measurements Length: (cm) 6.5 Width: (cm) 4 Depth: (cm) 0.2 Area: (cm) 20.42 Volume: (cm) 4.084 % Reduction in Area: -3959.6% % Reduction in Volume: -2604.6% Epithelialization: Medium (34-66%) Tunneling: No Undermining: No Wound Description Classification: Full Thickness Without Exposed Support Struct Wound Margin: Indistinct, nonvisible Exudate Amount: Large Exudate Type: Serous Exudate Color: amber ures Foul Odor After Cleansing: No Slough/Fibrino Yes Wound Bed Granulation Amount: Large (67-100%) Exposed Structure Granulation Quality: Red Fascia Exposed: No Necrotic Amount: Small (1-33%) Fat Layer (Subcutaneous Tissue) Exposed: Yes Necrotic Quality: Adherent Slough Tendon Exposed: No Muscle Exposed: No Joint Exposed: No Bone Exposed: No Treatment Notes Wound #1 (Left, Medial Lower Leg) Notes prisma, ABD, unna boot with tubi g at the top Electronic Signature(s) Signed: 06/15/2019 4:13:28 PM By: Verl Dicker, [name] ([id number]) Entered By: Army Melia on 06/15/2019 08:14:42 Starner, [name] ([id number]) -------------------------------------------------------------------------------- Vitals Details Patient Name: [name]. Date of Service: 06/15/2019 8:00 AM Medical Record Number: [id number] Patient Account Number: [id number] Date of Birth/Sex: 1945/07/20 (73 y.o. F) Treating RN: Army Melia Primary Care Malloree Raboin: Clarene Reamer Other Clinician: Referring Cherise Fedder: Clarene Reamer Treating Tazia Illescas/Extender: Melburn Hake, HOYT Weeks in Treatment: 4 Vital Signs Time Taken: 08:07 Temperature (F): 98.0 Height (in): 62 Pulse (bpm): 71 Weight (lbs): 380 Respiratory Rate (breaths/min): 16 Body Mass Index (BMI): 69.5 Blood Pressure (mmHg): 156/65 Reference Range: 80 - 120 mg / dl Electronic  Signature(s) Signed: 06/15/2019 4:13:28 PM By: Army Melia Entered By: Army Melia on 06/15/2019 08:08:36

## 2019-06-17 NOTE — Progress Notes (Signed)
[name], [name] Visit Report for 06/15/2019 Chief Complaint Document Details Patient Name: [name], [name]. Date of Service: 06/15/2019 8:00 AM Medical Record Number: [id number] Patient Account Number: [id number] Date of Birth/Sex: 1945/05/23 (74 y.o. F) Treating RN: Montey Hora Primary Care Provider: Clarene Reamer Other Clinician: Referring Provider: Clarene Reamer Treating Provider/Extender: Melburn Hake, Mckynleigh Mussell Weeks in Treatment: 4 Information Obtained from: Patient Chief Complaint Left LE and ankle ulcers Electronic Signature(s) Signed: 06/16/2019 5:56:47 PM By: Worthy Keeler PA-C Entered By: Worthy Keeler on 06/15/2019 08:17:01 Mcelhinny, [name] -------------------------------------------------------------------------------- HPI Details Patient Name: [name] Date of Service: 06/15/2019 8:00 AM Medical Record Number: [id number] Patient Account Number: [id number] Date of Birth/Sex: April 04, 1945 (74 y.o. F) Treating RN: Montey Hora Primary Care Provider: Clarene Reamer Other Clinician: Referring Provider: Clarene Reamer Treating Provider/Extender: [place] Hake, Oron Westrup Weeks in Treatment: 4 History of Present Illness HPI Description: 05/17/2019 upon evaluation today patient presents today for initial evaluation here in the clinic regarding her left lower extremity open wound secondary to lymphedema. She has previously been seen at the [facility] clinic 1 time in July 2020 but did not have any follow-up after that point. Subsequently she has not been seen here in [place] prior to today. Currently she tells me that she has been having issues for the past 1-2 months with wounds over the left lower extremity 2 that are more distinct she did has a breakdown of an area which is much more superficial as well on her leg but in general she mainly is dealing with what I would term stage III lymphedema. Fortunately there is no signs of active cellulitis  she does have erythema but I think this is more stasis dermatitis than anything infectious in nature. Fortunately there is no evidence of systemic infection which is also good news. She does have a history of lymphedema though she is never worn compression stockings she tells me. She does have hypertension as well. 05/25/2019 upon evaluation today patient appears to be doing about the same with regard to her wounds upon inspection today. Her edema is somewhat better but still with the larger calf muscle area and smaller area around her ankle it is very difficult to wrap her appropriately without the sliding down. Patient really did not feel like it slid down too much nonetheless it did appear to slide quite a bit based on what we were seeing today. Last Thursday was the same. With that being said she still does need compression we did have a discussion between several of the nurses and myself today with the patient about what the best thing to do may be. 06/01/2019 upon evaluation today patient appears to be doing slightly better in regard to her leg at this point. There are still a lot of irritation but she does not seem to be having as much as much issue with the weeping as compared to last time still we are not really doing a whole lot better as of yet in general. 06/08/2019 upon evaluation today patient actually seems to be making excellent progress in regard to her wounds and her leg in general. The weeping has dramatically improved and overall very pleased with things appear today. There is no signs of active infection which is good news. No fevers, chills, nausea, vomiting, or diarrhea. 06/15/2019 upon evaluation today patient appears to be doing excellent in regard to her left lower extremity. The wounds are measuring smaller and overall things are doing well despite the fact that  again we have had a difficult time getting the appropriate wrap on her. Nonetheless I do believe the Unna boot on the  bottom part with utilizing the Tubigrip at the top has done fairly well and again she seems to be making good progress. There is no signs of active infection at this time. Electronic Signature(s) Signed: 06/15/2019 9:19:29 AM By: Worthy Keeler PA-C Entered By: Worthy Keeler on 06/15/2019 09:19:29 [name], [name] -------------------------------------------------------------------------------- Physical Exam Details Patient Name: [name]. Date of Service: 06/15/2019 8:00 AM Medical Record Number: [id number] Patient Account Number: [id number] Date of Birth/Sex: 17-Sep-1945 (74 y.o. F) Treating RN: Montey Hora Primary Care Provider: Clarene Reamer Other Clinician: Referring Provider: Clarene Reamer Treating Provider/Extender: Melburn Hake, Abril Cappiello Weeks in Treatment: 4 Constitutional Well-nourished and well-hydrated in no acute distress. Respiratory normal breathing without difficulty. Psychiatric this patient is able to make decisions and demonstrates good insight into disease process. Alert and Oriented x 3. pleasant and cooperative. Notes Patient's wound bed currently showed signs of good granulation at this point. Fortunately there is no evidence of active infection which is good news. And overall I feel like the patient is improving I do believe being that she is having less drainage and weeping at this time that we can switch her over to a collagen-based dressing for the wounds which should help to heal things up. Electronic Signature(s) Signed: 06/15/2019 9:20:25 AM By: Worthy Keeler PA-C Entered By: Worthy Keeler on 06/15/2019 09:20:25 [name], [name] -------------------------------------------------------------------------------- Physician Orders Details Patient Name: [name] Date of Service: 06/15/2019 8:00 AM Medical Record Number: [id number] Patient Account Number: [id number] Date of Birth/Sex: Feb 22, 1946 (74 y.o. F) Treating  RN: Montey Hora Primary Care Provider: Clarene Reamer Other Clinician: Referring Provider: Clarene Reamer Treating Provider/Extender: Melburn Hake, Gael Londo Weeks in Treatment: 4 Verbal / Phone Orders: No Diagnosis Coding ICD-10 Coding Code Description I89.0 Lymphedema, not elsewhere classified L97.822 Non-pressure chronic ulcer of other part of left lower leg with fat layer exposed L97.322 Non-pressure chronic ulcer of left ankle with fat layer exposed L97.821 Non-pressure chronic ulcer of other part of left lower leg limited to breakdown of skin I10 Essential (primary) hypertension Wound Cleansing Wound #1 Left,Medial Lower Leg o Cleanse wound with mild soap and water - in office o May shower with protection. Skin Barriers/Peri-Wound Care o Triamcinolone Acetonide Ointment (TCA) - to lower leg Primary Wound Dressing Wound #1 Left,Medial Lower Leg o Silver Collagen Secondary Dressing Wound #1 Left,Medial Lower Leg o ABD pad Dressing Change Frequency Wound #1 Left,Medial Lower Leg o Other: - Twice weekly Follow-up Appointments Wound #1 Left,Medial Lower Leg o Return Appointment in 1 week. o Nurse Visit as needed - Thursday or Friday Edema Control Wound #1 Left,Medial Lower Leg o Unna Boot to Left Lower Extremity o Elevate legs to the level of the heart and pump ankles as often as possible o Other: - Tubi G at the top of patient's calf Electronic Signature(s) Signed: 06/15/2019 4:41:39 PM By: Montey Hora Signed: 06/16/2019 5:56:47 PM By: Worthy Keeler PA-C Entered By: Montey Hora on 06/15/2019 08:31:07 [name], [name] -------------------------------------------------------------------------------- Problem List Details Patient Name: Alyssa Reges C. Date of Service: 06/15/2019 8:00 AM Medical Record Number: [id number] Patient Account Number: [id number] Date of Birth/Sex: 1945-04-16 (74 y.o. F) Treating RN: Montey Hora Primary  Care Provider: Clarene Reamer Other Clinician: Referring Provider: Clarene Reamer Treating Provider/Extender: Melburn Hake, Joselyn Edling Weeks in Treatment: 4 Active Problems ICD-10 Evaluated Encounter Code Description  Active Date Today Diagnosis I89.0 Lymphedema, not elsewhere classified 05/17/2019 No Yes L97.822 Non-pressure chronic ulcer of other part of left lower leg with fat layer 05/17/2019 No Yes exposed L97.322 Non-pressure chronic ulcer of left ankle with fat layer exposed 05/17/2019 No Yes L97.821 Non-pressure chronic ulcer of other part of left lower leg limited to 05/17/2019 No Yes breakdown of skin I10 Essential (primary) hypertension 05/17/2019 No Yes Inactive Problems Resolved Problems Electronic Signature(s) Signed: 06/16/2019 5:56:47 PM By: Worthy Keeler PA-C Entered By: Worthy Keeler on 06/15/2019 08:16:50 Bazar, [name] -------------------------------------------------------------------------------- Progress Note Details Patient Name: [name]. Date of Service: 06/15/2019 8:00 AM Medical Record Number: [id number] Patient Account Number: [id number] Date of Birth/Sex: 05/13/1945 (74 y.o. F) Treating RN: Montey Hora Primary Care Provider: Clarene Reamer Other Clinician: Referring Provider: Clarene Reamer Treating Provider/Extender: Melburn Hake, Lester Crickenberger Weeks in Treatment: 4 Subjective Chief Complaint Information obtained from Patient Left LE and ankle ulcers History of Present Illness (HPI) 05/17/2019 upon evaluation today patient presents today for initial evaluation here in the clinic regarding her left lower extremity open wound secondary to lymphedema. She has previously been seen at the [facility] clinic 1 time in July 2020 but did not have any follow-up after that point. Subsequently she has not been seen here in [place] prior to today. Currently she tells me that she has been having issues for the past 1-2 months with wounds over the  left lower extremity 2 that are more distinct she did has a breakdown of an area which is much more superficial as well on her leg but in general she mainly is dealing with what I would term stage III lymphedema. Fortunately there is no signs of active cellulitis she does have erythema but I think this is more stasis dermatitis than anything infectious in nature. Fortunately there is no evidence of systemic infection which is also good news. She does have a history of lymphedema though she is never worn compression stockings she tells me. She does have hypertension as well. 05/25/2019 upon evaluation today patient appears to be doing about the same with regard to her wounds upon inspection today. Her edema is somewhat better but still with the larger calf muscle area and smaller area around her ankle it is very difficult to wrap her appropriately without the sliding down. Patient really did not feel like it slid down too much nonetheless it did appear to slide quite a bit based on what we were seeing today. Last Thursday was the same. With that being said she still does need compression we did have a discussion between several of the nurses and myself today with the patient about what the best thing to do may be. 06/01/2019 upon evaluation today patient appears to be doing slightly better in regard to her leg at this point. There are still a lot of irritation but she does not seem to be having as much as much issue with the weeping as compared to last time still we are not really doing a whole lot better as of yet in general. 06/08/2019 upon evaluation today patient actually seems to be making excellent progress in regard to her wounds and her leg in general. The weeping has dramatically improved and overall very pleased with things appear today. There is no signs of active infection which is good news. No fevers, chills, nausea, vomiting, or diarrhea. 06/15/2019 upon evaluation today patient appears to be  doing excellent in regard to her left lower extremity. The  wounds are measuring smaller and overall things are doing well despite the fact that again we have had a difficult time getting the appropriate wrap on her. Nonetheless I do believe the Unna boot on the bottom part with utilizing the Tubigrip at the top has done fairly well and again she seems to be making good progress. There is no signs of active infection at this time. Objective Constitutional Well-nourished and well-hydrated in no acute distress. Vitals Time Taken: 8:07 AM, Height: 62 in, Weight: 380 lbs, BMI: 69.5, Temperature: 98.0 F, Pulse: 71 bpm, Respiratory Rate: 16 breaths/min, Blood Pressure: 156/65 mmHg. Respiratory normal breathing without difficulty. Psychiatric this patient is able to make decisions and demonstrates good insight into disease process. Alert and Oriented x 3. pleasant and cooperative. [name], [name] General Notes: Patient's wound bed currently showed signs of good granulation at this point. Fortunately there is no evidence of active infection which is good news. And overall I feel like the patient is improving I do believe being that she is having less drainage and weeping at this time that we can switch her over to a collagen-based dressing for the wounds which should help to heal things up. Integumentary (Hair, Skin) Wound #1 status is Open. Original cause of wound was Gradually Appeared. The wound is located on the Left,Medial Lower Leg. The wound measures 6.5cm length x 4cm width x 0.2cm depth; 20.42cm^2 area and 4.084cm^3 volume. There is Fat Layer (Subcutaneous Tissue) Exposed exposed. There is no tunneling or undermining noted. There is a large amount of serous drainage noted. The wound margin is indistinct and nonvisible. There is large (67-100%) red granulation within the wound bed. There is a small (1-33%) amount of necrotic tissue within the wound bed including Adherent  Slough. 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 There was a Haematologist Compression Therapy Procedure with a pre-treatment ABI of 1.2 by Montey Hora, RN. Post procedure Diagnosis Wound #1: Same as Pre-Procedure Plan Wound Cleansing: Wound #1 Left,Medial Lower Leg: Cleanse wound with mild soap and water - in office May shower with protection. Skin Barriers/Peri-Wound Care: Triamcinolone Acetonide Ointment (TCA) - to lower leg Primary Wound Dressing: Wound #1 Left,Medial Lower Leg: Silver Collagen Secondary Dressing: Wound #1 Left,Medial Lower Leg: ABD pad Dressing Change Frequency: Wound #1 Left,Medial Lower Leg: Other: - Twice weekly Follow-up Appointments: Wound #1 Left,Medial Lower Leg: Return Appointment in 1 week. Nurse Visit as needed - Thursday or Friday Edema Control: Wound #1 Left,Medial Lower Leg: Unna Boot to Left Lower Extremity Elevate legs to the level of the heart and pump ankles as often as possible Other: - Tubi G at the top of patient's calf [name], Alyssa C. ([id number]) 1. My suggestion at this time is can be that we go ahead and continue with the compression wrap as we have been utilizing doing the Unna boot on the lower portion of her leg and Tubigrip on the top. 2. We will get a switch to a silver collagen dressing which I think would be better for her as well. 3. Also can recommend she continue to elevate her legs much as possible. 4. I  did discuss going ahead and get in touch with [facility] in order to see what needs to be done in order to be  able to be measured and fitted for custom compression socks as soon as possible when she heals. Obviously the sooner she can have a plan in place for this to better she is getting need ongoing compression once we are no longer wrapping her. And again this is can have to be custom due to the shape and size of her leg standard compression socks or not can work here. We will see patient back for reevaluation in 1 week here in the clinic. If anything worsens or changes patient will contact our office for additional recommendations. Electronic Signature(s) Signed: 06/15/2019 9:21:21 AM By: Worthy Keeler PA-C Entered By: Worthy Keeler on 06/15/2019 09:21:20 [name], [name] -------------------------------------------------------------------------------- SuperBill Details Patient Name: [name] Date of Service: 06/15/2019 Medical Record Number: [id number] Patient Account Number: [id number] Date of Birth/Sex: 03/07/1946 (74 y.o. 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 layer exposed L97.322 Non-pressure chronic ulcer of left ankle with fat layer exposed L97.821 Non-pressure chronic ulcer of other part of left lower leg limited to breakdown of skin I10 Essential (primary) hypertension Facility Procedures CPT4 Code: [id number] Description: (Facility Use Only) [id number] - APPLY UNNA BOOT LT Modifier: Quantity: 1 Physician Procedures CPT4 [id number] Description: [id number] - WC PHYS LEVEL 3 - EST PT Modifier: Quantity: 1 CPT4 Code: Description: ICD-10 Diagnosis Description I89.0 Lymphedema, not elsewhere classified L97.822 Non-pressure chronic ulcer of other part  of left lower leg with fat layer ex L97.821 Non-pressure chronic ulcer of other part of left lower leg limited to breakd  L97.322 Non-pressure chronic ulcer of left ankle with fat layer exposed Modifier: posed own of skin Quantity: Electronic Signature(s) Signed: 06/15/2019 9:21:38 AM By: Worthy Keeler PA-C Entered By: Worthy Keeler on 06/15/2019 09:21:37

## 2019-06-18 ENCOUNTER — Other Ambulatory Visit: Payer: Self-pay

## 2019-06-18 DIAGNOSIS — I10 Essential (primary) hypertension: Secondary | ICD-10-CM | POA: Diagnosis not present

## 2019-06-18 DIAGNOSIS — Z6841 Body Mass Index (BMI) 40.0 and over, adult: Secondary | ICD-10-CM | POA: Diagnosis not present

## 2019-06-18 DIAGNOSIS — L97322 Non-pressure chronic ulcer of left ankle with fat layer exposed: Secondary | ICD-10-CM | POA: Diagnosis not present

## 2019-06-18 DIAGNOSIS — L97821 Non-pressure chronic ulcer of other part of left lower leg limited to breakdown of skin: Secondary | ICD-10-CM | POA: Diagnosis not present

## 2019-06-18 DIAGNOSIS — I890 Lymphedema, not elsewhere classified: Secondary | ICD-10-CM | POA: Diagnosis not present

## 2019-06-18 DIAGNOSIS — L97822 Non-pressure chronic ulcer of other part of left lower leg with fat layer exposed: Secondary | ICD-10-CM | POA: Diagnosis not present

## 2019-06-18 NOTE — Progress Notes (Addendum)
DEISSY, ILLES ([id number]) Visit Report for 06/18/2019 Arrival Information Details Patient Name: [name], [name]. Date of Service: 06/18/2019 8:00 AM Medical Record Number: [id number] Patient Account Number: [id number] Date of Birth/Sex: 11/10/1945 (73 y.o. F) Treating RN: Army Melia Primary Care Athanasius Kesling: Clarene Reamer Other Clinician: Referring Tavoris Brisk: Clarene Reamer Treating Marks Scalera/Extender: [place] Hake, HOYT Weeks in Treatment: 4 Visit Information History Since Last Visit Added or deleted any medications: No Patient Arrived: Ambulatory Any new allergies or adverse reactions: No Arrival Time: 08:15 Had a fall or experienced change in No Accompanied By: self activities of daily living that may affect Transfer Assistance: None risk of falls: Patient Identification Verified: Yes Signs or symptoms of abuse/neglect since last visito No Hospitalized since last visit: No Has Dressing in Place as Prescribed: Yes Pain Present Now: No Electronic Signature(s) Signed: 06/18/2019 8:15:37 AM By: Army Melia Entered By: Army Melia on 06/18/2019 08:15:37 Watterson, [name] ([id number]) -------------------------------------------------------------------------------- Compression Therapy Details Patient Name: [name]. Date of Service: 06/18/2019 8:00 AM Medical Record Number: [id number] Patient Account Number: [id number] Date of Birth/Sex: 04/03/1945 (73 y.o. F) Treating RN: Army Melia Primary Care Chezney Huether: Clarene Reamer Other Clinician: Referring Julieana Eshleman: Clarene Reamer Treating Ronel Rodeheaver/Extender: [place] Hake, HOYT Weeks in Treatment: 4 Compression Therapy Performed for Wound Assessment: Wound #1 Left,Medial Lower Leg Performed By: Clinician Army Melia, RN Compression Type: Rolena Infante Electronic Signature(s) Signed: 06/18/2019 8:15:56 AM By: Army Melia Entered By: Army Melia on 06/18/2019 08:15:56 Jaggi, [name]  ([id number]) -------------------------------------------------------------------------------- Encounter Discharge Information Details Patient Name: [name]. Date of Service: 06/18/2019 8:00 AM Medical Record Number: [id number] Patient Account Number: [id number] Date of Birth/Sex: Oct 29, 1945 (73 y.o. F) Treating RN: Army Melia Primary Care Kisean Rollo: Clarene Reamer Other Clinician: Referring Aljean Horiuchi: Clarene Reamer Treating Natsha Guidry/Extender: Sharalyn Ink in Treatment: 4 Encounter Discharge Information Items Discharge Condition: Stable Ambulatory Status: Ambulatory Discharge Destination: Home Transportation: Private Auto Accompanied By: self Schedule Follow-up Appointment: Yes Clinical Summary of Care: Electronic Signature(s) Signed: 06/18/2019 8:16:23 AM By: Army Melia Entered By: Army Melia on 06/18/2019 08:16:23 Comas, [name] ([id number]) -------------------------------------------------------------------------------- Wound Assessment Details Patient Name: [name]. Date of Service: 06/18/2019 8:00 AM Medical Record Number: [id number] Patient Account Number: [id number] Date of Birth/Sex: 05-20-1945 (73 y.o. F) Treating RN: Army Melia Primary Care Gretchen Weinfeld: Clarene Reamer Other Clinician: Referring Lataria Courser: Clarene Reamer Treating Isaish Alemu/Extender: [place] Hake, HOYT Weeks in Treatment: 4 Wound Status Wound Number: 1 Primary Etiology: Lymphedema Wound Location: Left, Medial Lower Leg Wound Status: Open Wounding Event: Gradually Appeared Date Acquired: 08/24/2018 Weeks Of Treatment: 4 Clustered Wound: No Wound Measurements Length: (cm) 6.5 Width: (cm) 4 Depth: (cm) 0.2 Area: (cm) 20.42 Volume: (cm) 4.084 % Reduction in Area: -3959.6% % Reduction in Volume: -2604.6% Wound Description Classification: Full Thickness Without Exposed Support Structure s Treatment Notes Wound #1 (Left, Medial Lower Leg) Notes prisma, ABD, unna  boot with tubi g at the top Electronic Signature(s) Signed: 06/18/2019 11:26:24 AM By: Army Melia Entered By: Army Melia on 06/18/2019 08:15:44

## 2019-06-22 ENCOUNTER — Encounter: Payer: Medicare Other | Admitting: Physician Assistant

## 2019-06-22 ENCOUNTER — Other Ambulatory Visit: Payer: Self-pay

## 2019-06-22 DIAGNOSIS — I890 Lymphedema, not elsewhere classified: Secondary | ICD-10-CM | POA: Diagnosis not present

## 2019-06-22 DIAGNOSIS — L97822 Non-pressure chronic ulcer of other part of left lower leg with fat layer exposed: Secondary | ICD-10-CM | POA: Diagnosis not present

## 2019-06-22 DIAGNOSIS — L97322 Non-pressure chronic ulcer of left ankle with fat layer exposed: Secondary | ICD-10-CM | POA: Diagnosis not present

## 2019-06-22 DIAGNOSIS — I10 Essential (primary) hypertension: Secondary | ICD-10-CM | POA: Diagnosis not present

## 2019-06-22 DIAGNOSIS — Z6841 Body Mass Index (BMI) 40.0 and over, adult: Secondary | ICD-10-CM | POA: Diagnosis not present

## 2019-06-22 DIAGNOSIS — L97821 Non-pressure chronic ulcer of other part of left lower leg limited to breakdown of skin: Secondary | ICD-10-CM | POA: Diagnosis not present

## 2019-06-25 ENCOUNTER — Encounter: Payer: Medicare Other | Attending: Physician Assistant

## 2019-06-25 ENCOUNTER — Other Ambulatory Visit: Payer: Self-pay

## 2019-06-25 DIAGNOSIS — L97822 Non-pressure chronic ulcer of other part of left lower leg with fat layer exposed: Secondary | ICD-10-CM | POA: Insufficient documentation

## 2019-06-25 DIAGNOSIS — L97322 Non-pressure chronic ulcer of left ankle with fat layer exposed: Secondary | ICD-10-CM | POA: Insufficient documentation

## 2019-06-25 DIAGNOSIS — E669 Obesity, unspecified: Secondary | ICD-10-CM | POA: Diagnosis not present

## 2019-06-25 DIAGNOSIS — I10 Essential (primary) hypertension: Secondary | ICD-10-CM | POA: Diagnosis not present

## 2019-06-25 DIAGNOSIS — L97821 Non-pressure chronic ulcer of other part of left lower leg limited to breakdown of skin: Secondary | ICD-10-CM | POA: Diagnosis not present

## 2019-06-25 DIAGNOSIS — I890 Lymphedema, not elsewhere classified: Secondary | ICD-10-CM | POA: Diagnosis not present

## 2019-06-25 DIAGNOSIS — Z6841 Body Mass Index (BMI) 40.0 and over, adult: Secondary | ICD-10-CM | POA: Diagnosis not present

## 2019-06-25 NOTE — Progress Notes (Signed)
Alyssa, [name] ([id number]) Visit Report for 06/22/2019 Chief Complaint Document Details Patient Name: [name], [name]. Date of Service: 06/22/2019 8:00 AM Medical Record Number: [id number] Patient Account Number: [id number] Date of Birth/Sex: 03-17-1946 (74 y.o. F) Treating RN: Montey Hora Primary Care Provider: Clarene Reamer Other Clinician: Referring Provider: Clarene Reamer Treating Provider/Extender: Melburn Hake, [name] in Treatment: 5 Information Obtained from: Patient Chief Complaint Left LE and ankle ulcers Electronic Signature(s) Signed: 06/24/2019 5:29:46 PM By: Worthy Keeler PA-C Entered By: Worthy Keeler on 06/22/2019 08:23:02 Alyssa, [name] ([id number]) -------------------------------------------------------------------------------- HPI Details Patient Name: [name]. Date of Service: 06/22/2019 8:00 AM Medical Record Number: [id number] Patient Account Number: [id number] Date of Birth/Sex: 1945/09/28 (74 y.o. F) Treating RN: Montey Hora Primary Care Provider: Clarene Reamer Other Clinician: Referring Provider: Clarene Reamer Treating Provider/Extender: Melburn Hake, Boomer Winders [name] in Treatment: 5 History of Present Illness HPI Description: 05/17/2019 upon evaluation today patient presents today for initial evaluation here in the clinic regarding her left lower extremity open wound secondary to lymphedema. She has previously been seen at the [facility] clinic 1 time in July 2020 but did not have any follow-up after that point. Subsequently she has not been seen here in [place] prior to today. Currently she tells me that she has been having issues for the past 1-2 months with wounds over the left lower extremity 2 that are more distinct she did has a breakdown of an area which is much more superficial as well on her leg but in general she mainly is dealing with what I would term stage III lymphedema. Fortunately there is no signs of active cellulitis  she does have erythema but I think this is more stasis dermatitis than anything infectious in nature. Fortunately there is no evidence of systemic infection which is also good news. She does have a history of lymphedema though she is never worn compression stockings she tells me. She does have hypertension as well. 05/25/2019 upon evaluation today patient appears to be doing about the same with regard to her wounds upon inspection today. Her edema is somewhat better but still with the larger calf muscle area and smaller area around her ankle it is very difficult to wrap her appropriately without the sliding down. Patient really did not feel like it slid down too much nonetheless it did appear to slide quite a bit based on what we were seeing today. Last Thursday was the same. With that being said she still does need compression we did have a discussion between several of the nurses and myself today with the patient about what the best thing to do may be. 06/01/2019 upon evaluation today patient appears to be doing slightly better in regard to her leg at this point. There are still a lot of irritation but she does not seem to be having as much as much issue with the weeping as compared to last time still we are not really doing a whole lot better as of yet in general. 06/08/2019 upon evaluation today patient actually seems to be making excellent progress in regard to her wounds and her leg in general. The weeping has dramatically improved and overall very pleased with things appear today. There is no signs of active infection which is good news. No fevers, chills, nausea, vomiting, or diarrhea. 06/15/2019 upon evaluation today patient appears to be doing excellent in regard to her left lower extremity. The wounds are measuring smaller and overall things are doing well despite the fact that  again we have had a difficult time getting the appropriate wrap on her. Nonetheless I do believe the Unna boot on the  bottom part with utilizing the Tubigrip at the top has done fairly well and again she seems to be making good progress. There is no signs of active infection at this time. 06/22/2019 upon evaluation today patient appears to be doing better with regard to her wounds on the left lower extremity. She has been tolerating the dressing changes without complication. Fortunately there is no signs of active infection at this time. No fevers, chills, nausea, vomiting, or diarrhea. Electronic Signature(s) Signed: 06/22/2019 8:47:58 AM By: Worthy Keeler PA-C Entered By: Worthy Keeler on 06/22/2019 08:47:57 [name], [name] ([id number]) -------------------------------------------------------------------------------- Physical Exam Details Patient Name: [name]. Date of Service: 06/22/2019 8:00 AM Medical Record Number: [id number] Patient Account Number: [id number] Date of Birth/Sex: 1945-07-17 (74 y.o. F) Treating RN: Montey Hora Primary Care Provider: Clarene Reamer Other Clinician: Referring Provider: Clarene Reamer Treating Provider/Extender: Melburn Hake, Jinnifer Montejano [name] in Treatment: 5 Constitutional Well-nourished and well-hydrated in no acute distress. Respiratory normal breathing without difficulty. Psychiatric this patient is able to make decisions and demonstrates good insight into disease process. Alert and Oriented x 3. pleasant and cooperative. Notes Patient's wounds currently have minimal slough noted on the surface of the wound I was able to mechanically debride this away with saline and gauze post mechanical debridement this appears to be doing much better which is great news. Electronic Signature(s) Signed: 06/22/2019 8:48:16 AM By: Worthy Keeler PA-C Entered By: Worthy Keeler on 06/22/2019 08:48:16 [name], [name] ([id number]) -------------------------------------------------------------------------------- Physician Orders Details Patient Name: [name] Date  of Service: 06/22/2019 8:00 AM Medical Record Number: [id number] Patient Account Number: [id number] Date of Birth/Sex: 1945-08-18 (74 y.o. F) Treating RN: Montey Hora Primary Care Provider: Clarene Reamer Other Clinician: Referring Provider: Clarene Reamer Treating Provider/Extender: Melburn Hake, Nykira Reddix [name] in Treatment: 5 Verbal / Phone Orders: No Diagnosis Coding ICD-10 Coding Code Description I89.0 Lymphedema, not elsewhere classified L97.822 Non-pressure chronic ulcer of other part of left lower leg with fat layer exposed L97.322 Non-pressure chronic ulcer of left ankle with fat layer exposed L97.821 Non-pressure chronic ulcer of other part of left lower leg limited to breakdown of skin I10 Essential (primary) hypertension Wound Cleansing Wound #1 Left,Medial Lower Leg o Cleanse wound with mild soap and water - in office o May shower with protection. Skin Barriers/Peri-Wound Care o Triamcinolone Acetonide Ointment (TCA) - to lower leg Primary Wound Dressing Wound #1 Left,Medial Lower Leg o Silver Collagen Secondary Dressing Wound #1 Left,Medial Lower Leg o ABD pad Dressing Change Frequency Wound #1 Left,Medial Lower Leg o Other: - Twice weekly Follow-up Appointments Wound #1 Left,Medial Lower Leg o Return Appointment in 1 week. o Nurse Visit as needed - Thursday or Friday Edema Control Wound #1 Left,Medial Lower Leg o Unna Boot to Left Lower Extremity o Elevate legs to the level of the heart and pump ankles as often as possible o Other: - Tubi G at the top of patient's calf Electronic Signature(s) Signed: 06/24/2019 4:50:17 PM By: Montey Hora Signed: 06/24/2019 5:29:46 PM By: Worthy Keeler PA-C Entered By: Montey Hora on 06/22/2019 08:35:41 [name], [name] ([id number]) -------------------------------------------------------------------------------- Problem List Details Patient Name: [name]. Date of Service: 06/22/2019 8:00  AM Medical Record Number: [id number] Patient Account Number: [id number] Date of Birth/Sex: [birth year] (73 y.o. F) Treating RN: Montey Hora Primary Care Provider: Clarene Reamer Other Clinician: Referring Provider:  Clarene Reamer Treating Provider/Extender: Melburn Hake, Lavonne Cass [name] in Treatment: 5 Active Problems ICD-10 Evaluated Encounter Code Description Active Date Today Diagnosis I89.0 Lymphedema, not elsewhere classified 05/17/2019 No Yes L97.822 Non-pressure chronic ulcer of other part of left lower leg with fat layer 05/17/2019 No Yes exposed L97.322 Non-pressure chronic ulcer of left ankle with fat layer exposed 05/17/2019 No Yes L97.821 Non-pressure chronic ulcer of other part of left lower leg limited to 05/17/2019 No Yes breakdown of skin I10 Essential (primary) hypertension 05/17/2019 No Yes Inactive Problems Resolved Problems Electronic Signature(s) Signed: 06/24/2019 5:29:46 PM By: Worthy Keeler PA-C Entered By: Worthy Keeler on 06/22/2019 08:22:53 [name], [name] -------------------------------------------------------------------------------- Progress Note Details Patient Name: [name]. Date of Service: 06/22/2019 8:00 AM Medical Record Number: [id number] Patient Account Number: [id number] Date of Birth/Sex: Mar 14, 1946 (73 y.o. F) Treating RN: Montey Hora Primary Care Provider: Clarene Reamer Other Clinician: Referring Provider: Clarene Reamer Treating Provider/Extender: Melburn Hake, Race Latour [name] in Treatment: 5 Subjective Chief Complaint Information obtained from Patient Left LE and ankle ulcers History of Present Illness (HPI) 05/17/2019 upon evaluation today patient presents today for initial evaluation here in the clinic regarding her left lower extremity open wound secondary to lymphedema. She has previously been seen at the [facility] clinic 1 time in July 2020 but did not have any follow-up after that point. Subsequently she has  not been seen here in [place] prior to today. Currently she tells me that she has been having issues for the past 1-2 months with wounds over the left lower extremity 2 that are more distinct she did has a breakdown of an area which is much more superficial as well on her leg but in general she mainly is dealing with what I would term stage III lymphedema. Fortunately there is no signs of active cellulitis she does have erythema but I think this is more stasis dermatitis than anything infectious in nature. Fortunately there is no evidence of systemic infection which is also good news. She does have a history of lymphedema though she is never worn compression stockings she tells me. She does have hypertension as well. 05/25/2019 upon evaluation today patient appears to be doing about the same with regard to her wounds upon inspection today. Her edema is somewhat better but still with the larger calf muscle area and smaller area around her ankle it is very difficult to wrap her appropriately without the sliding down. Patient really did not feel like it slid down too much nonetheless it did appear to slide quite a bit based on what we were seeing today. Last Thursday was the same. With that being said she still does need compression we did have a discussion between several of the nurses and myself today with the patient about what the best thing to do may be. 06/01/2019 upon evaluation today patient appears to be doing slightly better in regard to her leg at this point. There are still a lot of irritation but she does not seem to be having as much as much issue with the weeping as compared to last time still we are not really doing a whole lot better as of yet in general. 06/08/2019 upon evaluation today patient actually seems to be making excellent progress in regard to her wounds and her leg in general. The weeping has dramatically improved and overall very pleased with things appear today. There is no  signs of active infection which is good news. No fevers, chills, nausea, vomiting, or diarrhea.  06/15/2019 upon evaluation today patient appears to be doing excellent in regard to her left lower extremity. The wounds are measuring smaller and overall things are doing well despite the fact that again we have had a difficult time getting the appropriate wrap on her. Nonetheless I do believe the Unna boot on the bottom part with utilizing the Tubigrip at the top has done fairly well and again she seems to be making good progress. There is no signs of active infection at this time. 06/22/2019 upon evaluation today patient appears to be doing better with regard to her wounds on the left lower extremity. She has been tolerating the dressing changes without complication. Fortunately there is no signs of active infection at this time. No fevers, chills, nausea, vomiting, or diarrhea. Objective Constitutional Well-nourished and well-hydrated in no acute distress. Vitals Time Taken: 8:05 AM, Height: 62 in, Weight: 380 lbs, BMI: 69.5, Temperature: 97.8 F, Pulse: 69 bpm, Respiratory Rate: 16 breaths/min, Blood Pressure: 124/52 mmHg. Respiratory normal breathing without difficulty. Psychiatric [name], Alyssa ([id number]) this patient is able to make decisions and demonstrates good insight into disease process. Alert and Oriented x 3. pleasant and cooperative. General Notes: Patient's wounds currently have minimal slough noted on the surface of the wound I was able to mechanically debride this away with saline and gauze post mechanical debridement this appears to be doing much better which is great news. Integumentary (Hair, Skin) Wound #1 status is Open. Original cause of wound was Gradually Appeared. The wound is located on the Left,Medial Lower Leg. The wound measures 7cm length x 2.5cm width x 0.2cm depth; 13.744cm^2 area and 2.749cm^3 volume. There is Fat Layer (Subcutaneous Tissue) Exposed exposed.  There is no tunneling or undermining noted. There is a medium amount of serosanguineous drainage noted. There is medium (34-66%) pink granulation within the wound bed. 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 There was a Haematologist Compression Therapy Procedure with a pre-treatment ABI of 1.2 by Montey Hora, RN. Post procedure Diagnosis Wound #1: Same as Pre-Procedure Plan Wound Cleansing: Wound #1 Left,Medial Lower Leg: Cleanse wound with mild soap and water - in office May shower with protection. Skin Barriers/Peri-Wound Care: Triamcinolone Acetonide Ointment (TCA) - to lower leg Primary Wound Dressing: Wound #1 Left,Medial Lower Leg: Silver Collagen Secondary Dressing: Wound #1 Left,Medial Lower Leg: ABD pad Dressing Change Frequency: Wound #1 Left,Medial Lower Leg: Other: - Twice weekly Follow-up Appointments: Wound #1 Left,Medial Lower Leg: Return Appointment in 1 week. Nurse Visit as needed - Thursday or Friday Edema Control: Wound #1 Left,Medial Lower Leg: Unna Boot to Left Lower Extremity Elevate legs to the level of the heart and pump ankles as often as possible Other: - Tubi G at the top of patient's calf [name], Alyssa C. ([id number]) 1. I would recommend currently that we go ahead and initiate a continuation of the treatment with regard to the triamcinolone to her leg to help with the skin which is fairly irritated at this point. 2. We will also continue with silver collagen to the open areas of her leg wound wise. 3. I am also can recommend that we go ahead  and utilize an Haematologist to the lower portion of her left lower extremity which I think has done decently well for her up to this point. 4. The patient does need to make sure to pump her ankles as often as possible and elevate her legs on a regular basis. We will use double layer Tubigrip on the top part of the patient's calf region where we cannot put a wrap due to its sliding down. We will see patient back for reevaluation in 1 week here in the clinic. If anything worsens or changes patient will contact our office for additional recommendations. Electronic Signature(s) Signed: 06/22/2019 8:49:20 AM By: Worthy Keeler PA-C Entered By: Worthy Keeler on 06/22/2019 08:49:20 [name], [name] -------------------------------------------------------------------------------- SuperBill Details Patient Name: [name] Date of Service: 06/22/2019 Medical Record Number: [id number] Patient Account Number: [id number] Date of Birth/Sex: June 13, 1945 (74 y.o. F) Treating RN: Montey Hora Primary Care Provider: Clarene Reamer Other Clinician: Referring Provider: Clarene Reamer Treating Provider/Extender: Melburn Hake, Norvin Ohlin [name] in Treatment: 5 Diagnosis Coding ICD-10 Codes Code Description I89.0 Lymphedema, not elsewhere classified L97.822 Non-pressure chronic ulcer of other part of left lower leg with fat layer exposed L97.322 Non-pressure chronic ulcer of left ankle with fat layer exposed L97.821 Non-pressure chronic ulcer of other part of left lower leg limited to breakdown of skin I10 Essential (primary) hypertension Facility Procedures CPT4 Code: [id number] Description: (Facility Use Only) [id number] - APPLY UNNA BOOT LT Modifier: Quantity: 1 Physician Procedures CPT4 [id number] Description: [id number] - WC PHYS LEVEL 3 - EST PT Modifier: Quantity: 1 CPT4 Code: Description: ICD-10 Diagnosis Description I89.0 Lymphedema, not elsewhere classified L97.822 Non-pressure chronic  ulcer of other part of left lower leg with fat layer ex L97.322 Non-pressure chronic ulcer of left ankle with fat layer exposed L97.821  Non-pressure chronic ulcer of other part of left lower leg limited to breakd Modifier: posed own of skin Quantity: Electronic Signature(s) Signed: 06/22/2019 8:49:35 AM By: Worthy Keeler PA-C Entered By: Worthy Keeler on 06/22/2019 08:49:35

## 2019-06-25 NOTE — Progress Notes (Signed)
[name] Visit Report for 06/22/2019 Arrival Information Details Patient Name: [name]. Date of Service: 06/22/2019 8:00 AM Medical Record Number: [id number] Patient Account Number: [id number] Date of Birth/Sex: December 03, 1945 (74 y.o. F) Treating RN: [name] Primary Care [name]: [name] Other Clinician: Referring [name]: [name] Treating [name]/Extender: [name], [name]: 5 Visit Information History Since Last Visit Added or deleted any medications: No Patient Arrived: Ambulatory Any new allergies or adverse reactions: No Arrival Time: 08:05 Had a fall or experienced change in No Accompanied By: self activities of daily living that may affect Transfer Assistance: None risk of falls: Patient Identification Verified: Yes Signs or symptoms of abuse/neglect since last visito No Hospitalized since last visit: No Has Dressing in Place as Prescribed: Yes Pain Present Now: No Electronic Signature(s) Signed: 06/22/2019 11:39:10 AM By: [name] Entered By: [name] on 06/22/2019 08:05:21 [place], [name] -------------------------------------------------------------------------------- Compression Therapy Details Patient Name: [name]. Date of Service: 06/22/2019 8:00 AM Medical Record Number: [id number] Patient Account Number: [id number] Date of Birth/Sex: 01/05/1946 (74 y.o. F) Treating RN: [name] Primary Care Maleta Pacha: [name] Other Clinician: Referring [name]: [name] Treating [name]/Extender: [name], [name]: 5 Compression Therapy Performed for Wound Assessment: Wound #1 Left,Medial Lower Leg Performed By: Clinician Alyssa Hora, RN Compression Type: Rolena Infante Pre [name] ABI: 1.2 Post Procedure Diagnosis Same as Pre-procedure Electronic Signature(s) Signed: 06/24/2019 4:50:17 PM By: [name] Entered By: [name] on  06/22/2019 08:34:26 Konicki, [name] -------------------------------------------------------------------------------- Encounter Discharge Information Details Patient Name: [name]. Date of Service: 06/22/2019 8:00 AM Medical Record Number: [id number] Patient Account Number: [id number] Date of Birth/Sex: June 04, 1945 (74 y.o. F) Treating RN: [name] Primary Care Jontez Redfield: [name] Other Clinician: Referring [name]: [name] Treating [name]/Extender: [name], [name]: 5 Encounter Discharge Information Items Discharge Condition: Stable Ambulatory Status: Ambulatory Discharge Destination: Home Transportation: Private Auto Accompanied By: self Schedule Follow-up Appointment: Yes Clinical Summary of Care: Electronic Signature(s) Signed: 06/24/2019 4:50:17 PM By: [name] Entered By: [name] on 06/22/2019 09:16:09 Gowell, [name] -------------------------------------------------------------------------------- Lower Extremity Assessment Details Patient Name: [name]. Date of Service: 06/22/2019 8:00 AM Medical Record Number: [id number] Patient Account Number: [id number] Date of Birth/Sex: [birth year] (74 y.o. F) Treating RN: [name] Primary Care [name]: [name] Other Clinician: Referring [name]: [name] Treating [name]/Extender: [name], [name]: 5 Edema Assessment Assessed: [Left: No] [Right: No] Edema: [Left: Ye] [Right: s] Calf Left: Right: Point of Measurement: 32 cm From Medial Instep 53 cm cm Ankle Left: Right: Point of Measurement: 10 cm From Medial Instep 27 cm cm Vascular Assessment Pulses: Dorsalis Pedis Palpable: [Left:Yes] Electronic Signature(s) Signed: 06/22/2019 11:39:10 AM By: [name] Entered By: [name] on 06/22/2019 08:15:06 Erman, [name] -------------------------------------------------------------------------------- Multi Wound Chart Details Patient Name: [name]. Date of Service: 06/22/2019 8:00 AM Medical Record Number: [id number] Patient Account Number: [id number] Date of Birth/Sex: Nov 28, 1945 (74 y.o. F) Treating RN: [name] Primary Care [name]: [name] Other Clinician: Referring [name]: [name] Treating [name]/Extender: [name], [name]: 5 Vital Signs Height(in): 62 Pulse(bpm): 69 Weight(lbs): 380 Blood Pressure(mmHg): 124/52 Body Mass Index(BMI): 69 Temperature(F): 97.8 Respiratory Rate(breaths/min): 16 Photos: [[name]:[name]] Wound Location: Left Lower Leg - Medial [name] [name] Wounding Event: Gradually Appeared [name] [name] Primary Etiology: Lymphedema [name] [name] Comorbid History: Hypertension [name] [name] Date Acquired: 08/24/2018 [name] [name] Weeks of [name]:  5 [name] [name] Wound Status: Open [name] [name] Measurements L x W x D (cm) 7x2.5x0.2 [name] [name] Area (cm) : 13.744 [name] [name] Volume (cm) : 2.749 [name] [name] % Reduction in Area: -2632.40% [name] [name] % Reduction in Volume: -1720.50% [name] [name] Classification: Full Thickness Without Exposed [name] [name] Support Structures Exudate Amount: Medium [name] [name] Exudate Type: Serosanguineous [name] [name] Exudate Color: red, brown [name] [name] Granulation Amount: Medium (34-66%) [name] [name] Granulation Quality: Pink [name] [name] Exposed Structures: Fat Layer (Subcutaneous Tissue) [name] [name] Exposed: Yes Fascia: No Tendon: No Muscle: No Joint: No Bone: No Epithelialization: None [name] [name] [name] Notes Electronic Signature(s) Signed: 06/24/2019 4:50:17 PM By: [name] Entered By: [name] on 06/22/2019 08:31:09 [place], [name] -------------------------------------------------------------------------------- [place] Details Patient Name: [name]. Date of Service: 06/22/2019 8:00 AM Medical Record  Number: [id number] Patient Account Number: [id number] Date of Birth/Sex: [birth year] (74 y.o. F) Treating RN: [name] Primary Care Martavion Couper: [name] Other Clinician: Referring Lakela Kuba: [name] Treating Keywon Mestre/Extender: [name], [name]: 5 Active Inactive Orientation to the Wound Care Program Nursing Diagnoses: Knowledge deficit related to the wound healing center program Goals: Patient/caregiver will verbalize understanding of the [facility] Date Initiated: 05/17/2019 Target Resolution Date: 06/04/2019 Goal Status: Active Interventions: Provide education on orientation to the wound center Notes: Venous Leg Ulcer Nursing Diagnoses: Knowledge deficit related to disease process and management Goals: Patient will maintain optimal edema control Date Initiated: 06/08/2019 Target Resolution Date: 08/28/2019 Goal Status: Active Interventions: Compression as ordered Notes: Wound/Skin Impairment Nursing Diagnoses: Impaired tissue integrity Goals: Ulcer/skin breakdown will have a volume reduction of 30% by week 4 Date Initiated: 05/17/2019 Target Resolution Date: 06/05/2019 Goal Status: Active Interventions: Assess ulceration(s) every visit Notes: Electronic Signature(s) Signed: 06/24/2019 4:50:17 PM By: [name] Entered By: [name] on 06/22/2019 08:31:01 Johnson, [name] [place], [name] -------------------------------------------------------------------------------- Pain Assessment Details Patient Name: [name]. Date of Service: 06/22/2019 8:00 AM Medical Record Number: [id number] Patient Account Number: [id number] Date of Birth/Sex: [birth year] (74 y.o. F) Treating RN: [name] Primary Care Shakeeta Godette: [name] Other Clinician: Referring Nashira Mcglynn: [name] Treating Karia Ehresman/Extender: [name], [name]: 5 Active Problems Location of Pain  Severity and Description of Pain Patient Has Paino No Site Locations Pain Management and Medication Current Pain Management: Electronic Signature(s) Signed: 06/22/2019 11:39:10 AM By: [name] Entered By: [name] on 06/22/2019 08:06:01 Schiavi, [name] -------------------------------------------------------------------------------- Patient/Caregiver Education Details Patient Name: [name]. Date of Service: 06/22/2019 8:00 AM Medical Record Number: [id number] Patient Account Number: [id number] Date of Birth/Gender: [birth year] (74 y.o. F) Treating RN: [name] Primary Care Physician: [name] Other Clinician: Referring Physician: Clarene [name] Treating Physician/Extender: Sharalyn Ink in [name]: 5 Education Assessment Education Provided To: Patient Education Topics Provided Venous: Handouts: Other: need for lymph pumps Methods: Explain/Verbal Responses: State content correctly Electronic Signature(s) Signed: 06/24/2019 4:50:17 PM By: [name] Entered By: [name] on 06/22/2019 08:36:24 Poullard, [name] -------------------------------------------------------------------------------- Wound Assessment Details Patient Name: [name] Date of Service: 06/22/2019 8:00 AM Medical Record Number: [id number] Patient Account Number: [id number] Date of Birth/Sex: 07-01-1945 (74 y.o. F) Treating RN: [name] Primary Care Nahomy Limburg: [name] Other Clinician: Referring Lauralei Clouse: [name] Treating Shaylon Gillean/Extender: [name], [name]: 5 Wound Status Wound Number: 1 Primary Etiology: Lymphedema Wound Location: Left Lower Leg - Medial Wound Status: Open Wounding Event: Gradually Appeared Comorbid History: Hypertension  Date Acquired: 08/24/2018 Weeks Of [name]: 5 Clustered Wound: No Photos Wound Measurements Length: (cm) 7 Width: (cm) 2.5 Depth: (cm) 0.2 Area:  (cm) 13.744 Volume: (cm) 2.749 % Reduction in Area: -2632.4% % Reduction in Volume: -1720.5% Epithelialization: None Tunneling: No Undermining: No Wound Description Classification: Full Thickness Without Exposed Support Struct Exudate Amount: Medium Exudate Type: Serosanguineous Exudate Color: red, brown ures Foul Odor After Cleansing: No Slough/Fibrino Yes Wound Bed Granulation Amount: Medium (34-66%) Exposed Structure Granulation Quality: Pink Fascia Exposed: No Fat Layer (Subcutaneous Tissue) Exposed: Yes Tendon Exposed: No Muscle Exposed: No Joint Exposed: No Bone Exposed: No [name] Notes Wound #1 (Left, Medial Lower Leg) Notes prisma, ABD, unna boot with tubi g at the top Electronic Signature(s) Signed: 06/22/2019 11:39:10 AM By: Verl Dicker, [name] ([id number]) Entered By: [name] on 06/22/2019 08:14:14 [place], [name] ([id number]) -------------------------------------------------------------------------------- Vitals Details Patient Name: [name]. Date of Service: 06/22/2019 8:00 AM Medical Record Number: [id number] Patient Account Number: [id number] Date of Birth/Sex: 1945/05/28 (74 y.o. F) Treating RN: [name] Primary Care Temitayo Covalt: [name] Other Clinician: Referring Vaniya Augspurger: [name] Treating Elnathan Fulford/Extender: [name], [name]: 5 Vital Signs Time Taken: 08:05 Temperature (F): 97.8 Height (in): 62 Pulse (bpm): 69 Weight (lbs): 380 Respiratory Rate (breaths/min): 16 Body Mass Index (BMI): 69.5 Blood Pressure (mmHg): 124/52 Reference Range: 80 - 120 mg / dl Electronic Signature(s) Signed: 06/22/2019 11:39:10 AM By: [name] Entered By: [name] on 06/22/2019 08:05:57

## 2019-06-28 NOTE — Progress Notes (Signed)
KRISSA, LAPOINTE ([id number]) Visit Report for 06/25/2019 Arrival Information Details Patient Name: [name], [name]. Date of Service: 06/25/2019 7:30 AM Medical Record Number: [id number] Patient Account Number: [id number] Date of Birth/Sex: July 07, 1945 (73 y.o. F) Treating RN: Cornell Barman Primary Care Sherhonda Gaspar: Clarene Reamer Other Clinician: Referring Zaakirah Kistner: Clarene Reamer Treating Nikoletta Varma/Extender: Melburn Hake, HOYT Weeks in Treatment: 5 Visit Information History Since Last Visit Has Dressing in Place as Prescribed: Yes Patient Arrived: Ambulatory Pain Present Now: Yes Arrival Time: 07:38 Accompanied By: self Transfer Assistance: None Patient Identification Verified: Yes Secondary Verification Process Completed: Yes Electronic Signature(s) Signed: 06/28/2019 11:52:59 AM By: Gretta Cool, BSN, RN, CWS, Kim RN, BSN Entered By: Gretta Cool, BSN, RN, CWS, Kim on 06/25/2019 07:38:39 [name] ([id number]) -------------------------------------------------------------------------------- Compression Therapy Details Patient Name: [name] Date of Service: 06/25/2019 7:30 AM Medical Record Number: [id number] Patient Account Number: [id number] Date of Birth/Sex: 1945/10/07 (73 y.o. F) Treating RN: Cornell Barman Primary Care Karolynn Infantino: Clarene Reamer Other Clinician: Referring Callista Hoh: Clarene Reamer Treating Benetta Maclaren/Extender: Melburn Hake, HOYT Weeks in Treatment: 5 Compression Therapy Performed for Wound Assessment: Wound #1 Left,Medial Lower Leg Performed By: Clinician Cornell Barman, RN Compression Type: Three Layer Pre Treatment ABI: 1.2 Electronic Signature(s) Signed: 06/28/2019 11:52:59 AM By: Gretta Cool, BSN, RN, CWS, Kim RN, BSN Entered By: Gretta Cool, BSN, RN, CWS, Kim on 06/25/2019 07:58:45 [name] ([id number]) -------------------------------------------------------------------------------- Encounter Discharge Information Details Patient Name: [name]. Date of Service:  06/25/2019 7:30 AM Medical Record Number: [id number] Patient Account Number: [id number] Date of Birth/Sex: 28-Aug-1945 (73 y.o. F) Treating RN: Cornell Barman Primary Care Junnie Loschiavo: Clarene Reamer Other Clinician: Referring Therron Sells: Clarene Reamer Treating Halina Asano/Extender: Sharalyn Ink in Treatment: 5 Encounter Discharge Information Items Discharge Condition: Stable Ambulatory Status: Ambulatory Discharge Destination: Home Transportation: Private Auto Accompanied By: self Schedule Follow-up Appointment: Yes Clinical Summary of Care: Electronic Signature(s) Signed: 06/28/2019 11:52:59 AM By: Gretta Cool, BSN, RN, CWS, Kim RN, BSN Entered By: Gretta Cool, BSN, RN, CWS, Kim on 06/25/2019 07:59:22 [name] ([id number]) -------------------------------------------------------------------------------- Wound Assessment Details Patient Name: [name], [name] Date of Service: 06/25/2019 7:30 AM Medical Record Number: [id number] Patient Account Number: [id number] Date of Birth/Sex: April 20, 1945 (73 y.o. F) Treating RN: Cornell Barman Primary Care Quetzaly Ebner: Clarene Reamer Other Clinician: Referring Wilburt Messina: Clarene Reamer Treating Anasophia Pecor/Extender: Melburn Hake, HOYT Weeks in Treatment: 5 Wound Status Wound Number: 1 Primary Etiology: Lymphedema Wound Location: Left, Medial Lower Leg Wound Status: Open Wounding Event: Gradually Appeared Comorbid History: Hypertension Date Acquired: 08/24/2018 Weeks Of Treatment: 5 Clustered Wound: No Wound Measurements Length: (cm) 0.5 Width: (cm) 0.2 Depth: (cm) 0.1 Area: (cm) 0.079 Volume: (cm) 0.008 % Reduction in Area: 84.3% % Reduction in Volume: 94.7% Epithelialization: None Tunneling: No Undermining: No Wound Description Classification: Full Thickness Without Exposed Support Structu Wound Margin: Flat and Intact Exudate Amount: Medium Exudate Type: Serosanguineous Exudate Color: red, brown res Foul Odor After Cleansing:  No Slough/Fibrino Yes Wound Bed Granulation Amount: Medium (34-66%) Exposed Structure Granulation Quality: Pink Fascia Exposed: No Fat Layer (Subcutaneous Tissue) Exposed: Yes Tendon Exposed: No Muscle Exposed: No Joint Exposed: No Bone Exposed: No Treatment Notes Wound #1 (Left, Medial Lower Leg) Notes prisma, ABD, unna boot with tubi g at the top [employer]) Signed: 06/28/2019 11:52:59 AM By: Gretta Cool, BSN, RN, CWS, Kim RN, BSN Entered By: Gretta Cool, BSN, RN, CWS, Kim on 06/25/2019 07:58:10

## 2019-06-29 ENCOUNTER — Other Ambulatory Visit: Payer: Self-pay

## 2019-06-29 ENCOUNTER — Encounter: Payer: Medicare Other | Admitting: Physician Assistant

## 2019-06-29 DIAGNOSIS — L97821 Non-pressure chronic ulcer of other part of left lower leg limited to breakdown of skin: Secondary | ICD-10-CM | POA: Diagnosis not present

## 2019-06-29 DIAGNOSIS — I10 Essential (primary) hypertension: Secondary | ICD-10-CM | POA: Diagnosis not present

## 2019-06-29 DIAGNOSIS — I890 Lymphedema, not elsewhere classified: Secondary | ICD-10-CM | POA: Diagnosis not present

## 2019-06-29 DIAGNOSIS — L97822 Non-pressure chronic ulcer of other part of left lower leg with fat layer exposed: Secondary | ICD-10-CM | POA: Diagnosis not present

## 2019-06-29 DIAGNOSIS — L97322 Non-pressure chronic ulcer of left ankle with fat layer exposed: Secondary | ICD-10-CM | POA: Diagnosis not present

## 2019-06-29 DIAGNOSIS — Z6841 Body Mass Index (BMI) 40.0 and over, adult: Secondary | ICD-10-CM | POA: Diagnosis not present

## 2019-06-30 NOTE — Progress Notes (Addendum)
ELEXIUS, BURRIER ([id number]) Visit Report for 06/29/2019 Chief Complaint Document Details Patient Name: [name], [name]. Date of Service: 06/29/2019 8:00 AM Medical Record Number: [id number] Patient Account Number: [id number] Date of Birth/Sex: [birth year] (74 y.o. F) Treating RN: Montey Hora Primary Care Provider: Clarene Reamer Other Clinician: Referring Provider: Clarene Reamer Treating Provider/Extender: Melburn Hake, Duran Ohern Weeks in Treatment: 6 Information Obtained from: Patient Chief Complaint Left LE and ankle ulcers Electronic Signature(s) Signed: 06/29/2019 8:24:35 AM By: Worthy Keeler PA-C Entered By: Worthy Keeler on 06/29/2019 08:24:34 [name], [name] -------------------------------------------------------------------------------- HPI Details Patient Name: [name]. Date of Service: 06/29/2019 8:00 AM Medical Record Number: [id number] Patient Account Number: [id number] Date of Birth/Sex: 1945-08-17 (74 y.o. F) Treating RN: Montey Hora Primary Care Provider: Clarene Reamer Other Clinician: Referring Provider: Clarene Reamer Treating Provider/Extender: Melburn Hake, Lonnie Reth Weeks in Treatment: 6 History of Present Illness HPI Description: 05/17/2019 upon evaluation today patient presents today for initial evaluation here in the clinic regarding her left lower extremity open wound secondary to lymphedema. She has previously been seen at the [facility] clinic 1 time in July 2020 but did not have any follow-up after that point. Subsequently she has not been seen here in [place] prior to today. Currently she tells me that she has been having issues for the past 1-2 months with wounds over the left lower extremity 2 that are more distinct she did has a breakdown of an area which is much more superficial as well on her leg but in general she mainly is dealing with what I would term stage III lymphedema. Fortunately there is no signs of active cellulitis she  does have erythema but I think this is more stasis dermatitis than anything infectious in nature. Fortunately there is no evidence of systemic infection which is also good news. She does have a history of lymphedema though she is never worn compression stockings she tells me. She does have hypertension as well. 05/25/2019 upon evaluation today patient appears to be doing about the same with regard to her wounds upon inspection today. Her edema is somewhat better but still with the larger calf muscle area and smaller area around her ankle it is very difficult to wrap her appropriately without the sliding down. Patient really did not feel like it slid down too much nonetheless it did appear to slide quite a bit based on what we were seeing today. Last Thursday was the same. With that being said she still does need compression we did have a discussion between several of the nurses and myself today with the patient about what the best thing to do may be. 06/01/2019 upon evaluation today patient appears to be doing slightly better in regard to her leg at this point. There are still a lot of irritation but she does not seem to be having as much as much issue with the weeping as compared to last time still we are not really doing a whole lot better as of yet in general. 06/08/2019 upon evaluation today patient actually seems to be making excellent progress in regard to her wounds and her leg in general. The weeping has dramatically improved and overall very pleased with things appear today. There is no signs of active infection which is good news. No fevers, chills, nausea, vomiting, or diarrhea. 06/15/2019 upon evaluation today patient appears to be doing excellent in regard to her left lower extremity. The wounds are measuring smaller and overall things are doing well despite the fact that  again we have had a difficult time getting the appropriate wrap on her. Nonetheless I do believe the Unna boot on the  bottom part with utilizing the Tubigrip at the top has done fairly well and again she seems to be making good progress. There is no signs of active infection at this time. 06/22/2019 upon evaluation today patient appears to be doing better with regard to her wounds on the left lower extremity. She has been tolerating the dressing changes without complication. Fortunately there is no signs of active infection at this time. No fevers, chills, nausea, vomiting, or diarrhea. 06/29/2019 upon evaluation today patient actually appears to be doing better in regard to the overall appearance of her leg ulcer. She has been tolerating the dressing changes without complication. Fortunately there is no signs of active infection at this time. No fevers, chills, nausea, vomiting, or diarrhea. Overall very pleased with how things seem to be progressing. Electronic Signature(s) Signed: 07/01/2019 8:44:27 AM By: Worthy Keeler PA-C Entered By: Worthy Keeler on 07/01/2019 08:44:27 [name] -------------------------------------------------------------------------------- Physical Exam Details Patient Name: [name]. Date of Service: 06/29/2019 8:00 AM Medical Record Number: [id number] Patient Account Number: [id number] Date of Birth/Sex: 23-Jan-1946 (74 y.o. F) Treating RN: Montey Hora Primary Care Provider: Clarene Reamer Other Clinician: Referring Provider: Clarene Reamer Treating Provider/Extender: Melburn Hake, Sheala Dosh Weeks in Treatment: 6 Constitutional Well-nourished and well-hydrated in no acute distress. Respiratory normal breathing without difficulty. Psychiatric this patient is able to make decisions and demonstrates good insight into disease process. Alert and Oriented x 3. pleasant and cooperative. Notes Patient's wound bed currently showed signs of good granulation at this time. Fortunately there is no evidence of active infection which is great news. I did not have to  perform any sharp debridement today which was also good news. Electronic Signature(s) Signed: 07/01/2019 8:45:22 AM By: Worthy Keeler PA-C Entered By: Worthy Keeler on 07/01/2019 08:45:22 [name], [name] -------------------------------------------------------------------------------- Physician Orders Details Patient Name: [name] Date of Service: 06/29/2019 8:00 AM Medical Record Number: [id number] Patient Account Number: [id number] Date of Birth/Sex: 1945-04-28 (74 y.o. F) Treating RN: Montey Hora Primary Care Provider: Clarene Reamer Other Clinician: Referring Provider: Clarene Reamer Treating Provider/Extender: Melburn Hake, Courtney Bellizzi Weeks in Treatment: 6 Verbal / Phone Orders: No Diagnosis Coding ICD-10 Coding Code Description I89.0 Lymphedema, not elsewhere classified L97.822 Non-pressure chronic ulcer of other part of left lower leg with fat layer exposed L97.322 Non-pressure chronic ulcer of left ankle with fat layer exposed L97.821 Non-pressure chronic ulcer of other part of left lower leg limited to breakdown of skin I10 Essential (primary) hypertension Wound Cleansing Wound #1 Left,Medial Lower Leg o Cleanse wound with mild soap and water - in office o May shower with protection. Wound #3 Left,Medial Ankle o Cleanse wound with mild soap and water - in office o May shower with protection. Wound #4 Left,Lateral Lower Leg o Cleanse wound with mild soap and water - in office o May shower with protection. Skin Barriers/Peri-Wound Care Wound #1 Left,Medial Lower Leg o Triamcinolone Acetonide Ointment (TCA) - to lower leg Wound #3 Left,Medial Ankle o Triamcinolone Acetonide Ointment (TCA) - to lower leg Wound #4 Left,Lateral Lower Leg o Triamcinolone Acetonide Ointment (TCA) - to lower leg Primary Wound Dressing Wound #1 Left,Medial Lower Leg o Silver Collagen Wound #3 Left,Medial Ankle o Silver Collagen Wound #4  Left,Lateral Lower Leg o Silver Alginate Secondary Dressing Wound #1 Left,Medial Lower Leg o ABD pad Wound #3  Left,Medial Ankle o ABD pad Wound #4 Left,Lateral Lower Leg o ABD pad Dressing Change Frequency Rather, Nohelia C. ([id number]) Wound #1 Left,Medial Lower Leg o Other: - Twice weekly Wound #3 Left,Medial Ankle o Other: - Twice weekly Wound #4 Left,Lateral Lower Leg o Other: - Twice weekly Follow-up Appointments o Return Appointment in 1 week. o Nurse Visit as needed - Thursday or Friday Edema Control Wound #1 Left,Medial Lower Leg o Unna Boot to Left Lower Extremity o Elevate legs to the level of the heart and pump ankles as often as possible o Other: - Tubi G at the top of patient's calf Wound #3 Left,Medial Ankle o Unna Boot to Left Lower Extremity o Elevate legs to the level of the heart and pump ankles as often as possible o Other: - Tubi G at the top of patient's calf Wound #4 Left,Lateral Lower Leg o Unna Boot to Left Lower Extremity o Elevate legs to the level of the heart and pump ankles as often as possible o Other: - Tubi G at the top of patient's calf Electronic Signature(s) Signed: 06/29/2019 4:31:26 PM By: Montey Hora Signed: 07/02/2019 10:31:16 AM By: Worthy Keeler PA-C Entered By: Montey Hora on 06/29/2019 08:34:35 [name], [name] -------------------------------------------------------------------------------- Problem List Details Patient Name: Alyssa Reges C. Date of Service: 06/29/2019 8:00 AM Medical Record Number: [id number] Patient Account Number: [id number] Date of Birth/Sex: 1945/04/06 (74 y.o. F) Treating RN: Montey Hora Primary Care Provider: Clarene Reamer Other Clinician: Referring Provider: Clarene Reamer Treating Provider/Extender: Melburn Hake, Krishiv Sandler Weeks in Treatment: 6 Active Problems ICD-10 Evaluated Encounter Code Description Active Date Today Diagnosis I89.0  Lymphedema, not elsewhere classified 05/17/2019 No Yes L97.822 Non-pressure chronic ulcer of other part of left lower leg with fat layer 05/17/2019 No Yes exposed L97.322 Non-pressure chronic ulcer of left ankle with fat layer exposed 05/17/2019 No Yes L97.821 Non-pressure chronic ulcer of other part of left lower leg limited to 05/17/2019 No Yes breakdown of skin I10 Essential (primary) hypertension 05/17/2019 No Yes Inactive Problems Resolved Problems Electronic Signature(s) Signed: 06/29/2019 8:24:28 AM By: Worthy Keeler PA-C Entered By: Worthy Keeler on 06/29/2019 08:24:28 [name], [name] -------------------------------------------------------------------------------- Progress Note Details Patient Name: [name]. Date of Service: 06/29/2019 8:00 AM Medical Record Number: [id number] Patient Account Number: [id number] Date of Birth/Sex: 1945/09/20 (74 y.o. F) Treating RN: Montey Hora Primary Care Provider: Clarene Reamer Other Clinician: Referring Provider: Clarene Reamer Treating Provider/Extender: Melburn Hake, Cintia Gleed Weeks in Treatment: 6 Subjective Chief Complaint Information obtained from Patient Left LE and ankle ulcers History of Present Illness (HPI) 05/17/2019 upon evaluation today patient presents today for initial evaluation here in the clinic regarding her left lower extremity open wound secondary to lymphedema. She has previously been seen at the [facility] clinic 1 time in July 2020 but did not have any follow-up after that point. Subsequently she has not been seen here in [place] prior to today. Currently she tells me that she has been having issues for the past 1-2 months with wounds over the left lower extremity 2 that are more distinct she did has a breakdown of an area which is much more superficial as well on her leg but in general she mainly is dealing with what I would term stage III lymphedema. Fortunately there is no signs of active  cellulitis she does have erythema but I think this is more stasis dermatitis than anything infectious in nature. Fortunately there is no evidence of systemic infection which is also good  news. She does have a history of lymphedema though she is never worn compression stockings she tells me. She does have hypertension as well. 05/25/2019 upon evaluation today patient appears to be doing about the same with regard to her wounds upon inspection today. Her edema is somewhat better but still with the larger calf muscle area and smaller area around her ankle it is very difficult to wrap her appropriately without the sliding down. Patient really did not feel like it slid down too much nonetheless it did appear to slide quite a bit based on what we were seeing today. Last Thursday was the same. With that being said she still does need compression we did have a discussion between several of the nurses and myself today with the patient about what the best thing to do may be. 06/01/2019 upon evaluation today patient appears to be doing slightly better in regard to her leg at this point. There are still a lot of irritation but she does not seem to be having as much as much issue with the weeping as compared to last time still we are not really doing a whole lot better as of yet in general. 06/08/2019 upon evaluation today patient actually seems to be making excellent progress in regard to her wounds and her leg in general. The weeping has dramatically improved and overall very pleased with things appear today. There is no signs of active infection which is good news. No fevers, chills, nausea, vomiting, or diarrhea. 06/15/2019 upon evaluation today patient appears to be doing excellent in regard to her left lower extremity. The wounds are measuring smaller and overall things are doing well despite the fact that again we have had a difficult time getting the appropriate wrap on her. Nonetheless I do believe the Unna  boot on the bottom part with utilizing the Tubigrip at the top has done fairly well and again she seems to be making good progress. There is no signs of active infection at this time. 06/22/2019 upon evaluation today patient appears to be doing better with regard to her wounds on the left lower extremity. She has been tolerating the dressing changes without complication. Fortunately there is no signs of active infection at this time. No fevers, chills, nausea, vomiting, or diarrhea. 06/29/2019 upon evaluation today patient actually appears to be doing better in regard to the overall appearance of her leg ulcer. She has been tolerating the dressing changes without complication. Fortunately there is no signs of active infection at this time. No fevers, chills, nausea, vomiting, or diarrhea. Overall very pleased with how things seem to be progressing. Objective Constitutional Well-nourished and well-hydrated in no acute distress. Vitals Time Taken: 8:04 AM, Height: 62 in, Weight: 380 lbs, BMI: 69.5, Temperature: 97.9 F, Pulse: 69 bpm, Respiratory Rate: 16 breaths/min, Blood Pressure: 131/63 mmHg. [name], [name] Respiratory normal breathing without difficulty. Psychiatric this patient is able to make decisions and demonstrates good insight into disease process. Alert and Oriented x 3. pleasant and cooperative. General Notes: Patient's wound bed currently showed signs of good granulation at this time. Fortunately there is no evidence of active infection which is great news. I did not have to perform any sharp debridement today which was also good news. Integumentary (Hair, Skin) Wound #1 status is Open. Original cause of wound was Gradually Appeared. The wound is located on the Left,Medial Lower Leg. The wound measures 1cm length x 1cm width x 0.1cm depth; 0.785cm^2 area and 0.079cm^3 volume. There is Fat  Layer (Subcutaneous Tissue) Exposed exposed. There is a medium amount of  serosanguineous drainage noted. The wound margin is flat and intact. There is medium (34-66%) pink granulation within the wound bed. There is a medium (34-66%) amount of necrotic tissue within the wound bed including Adherent Slough. Wound #3 status is Open. Original cause of wound was Gradually Appeared. The wound is located on the Left,Medial Ankle. The wound measures 1cm length x 0.4cm width x 0.1cm depth; 0.314cm^2 area and 0.031cm^3 volume. There is Fat Layer (Subcutaneous Tissue) Exposed exposed. There is no tunneling or undermining noted. There is a medium amount of serosanguineous drainage noted. There is small (1-33%) pink granulation within the wound bed. There is a large (67-100%) amount of necrotic tissue within the wound bed including Adherent Slough. Wound #4 status is Open. Original cause of wound was Gradually Appeared. The wound is located on the Left,Lateral Lower Leg. The wound measures 1.2cm length x 4cm width x 0.1cm depth; 3.77cm^2 area and 0.377cm^3 volume. There is Fat Layer (Subcutaneous Tissue) Exposed exposed. There is no tunneling or undermining noted. There is a medium amount of serosanguineous drainage noted. There is large (67-100%) red granulation within the wound bed. There is no necrotic tissue within the wound bed. 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 There was a Haematologist Compression Therapy Procedure with a pre-treatment ABI of 1.2 by Montey Hora, RN. Post procedure Diagnosis Wound #1: Same as Pre-Procedure Wound #3 Pre-procedure diagnosis of Wound  #3 is a Venous Leg Ulcer located on the Left,Medial Ankle . There was a Haematologist Compression Therapy Procedure with a pre-treatment ABI of 1.2 by Montey Hora, RN. Post procedure Diagnosis Wound #3: Same as Pre-Procedure Wound #4 Pre-procedure diagnosis of Wound #4 is a Venous Leg Ulcer located on the Left,Lateral Lower Leg . There was a Haematologist Compression Therapy Procedure with a pre-treatment ABI of 1.2 by Montey Hora, RN. Post procedure Diagnosis Wound #4: Same as Pre-Procedure [name], Alyssa C. ([id number]) Plan Wound Cleansing: Wound #1 Left,Medial Lower Leg: Cleanse wound with mild soap and water - in office May shower with protection. Wound #3 Left,Medial Ankle: Cleanse wound with mild soap and water - in office May shower with protection. Wound #4 Left,Lateral Lower Leg: Cleanse wound with mild soap and water - in office May shower with protection. Skin Barriers/Peri-Wound Care: Wound #1 Left,Medial Lower Leg: Triamcinolone Acetonide Ointment (TCA) - to lower leg Wound #3 Left,Medial Ankle: Triamcinolone Acetonide Ointment (TCA) - to lower leg Wound #4 Left,Lateral Lower Leg: Triamcinolone Acetonide Ointment (TCA) - to lower leg Primary Wound Dressing: Wound #1 Left,Medial Lower Leg: Silver Collagen Wound #3 Left,Medial Ankle: Silver Collagen Wound #4 Left,Lateral Lower Leg: Silver Alginate Secondary Dressing: Wound #1 Left,Medial Lower Leg: ABD pad Wound #3 Left,Medial Ankle: ABD pad Wound #4 Left,Lateral Lower Leg: ABD pad Dressing Change Frequency: Wound #1 Left,Medial Lower Leg: Other: - Twice weekly Wound #3 Left,Medial Ankle: Other: - Twice weekly Wound #4 Left,Lateral Lower Leg: Other: - Twice weekly Follow-up Appointments: Return Appointment in 1 week. Nurse Visit as needed - Thursday or Friday Edema Control: Wound #1 Left,Medial Lower Leg: [employer]  to Left Lower Extremity Elevate legs to the level of the heart and pump ankles as often as  possible Other: - Tubi G at the top of patient's calf Wound #3 Left,Medial Ankle: Unna Boot to Left Lower Extremity Elevate legs to the level of the heart and pump ankles as often as possible Other: - Tubi G at the top of patient's calf Wound #4 Left,Lateral Lower Leg: Unna Boot to Left Lower Extremity Elevate legs to the level of the heart and pump ankles as often as possible Other: - Tubi G at the top of patient's calf 1. My suggestion at this point is can be that we continue with the current wound care measures which includes utilizing triamcinolone to the leg in general and then silver collagen to the open wound areas. There is an alginate area that we will do on the lateral portion of the leg to help with keeping this area dry as it was somewhat weepy. 2. I would recommend as well that we continue with the Unna boot wrap to the lower extremity and then the Tubigrip on the upper part of her leg this has kept things from sliding around which is good news. We will see patient back for reevaluation in 1 week here in the clinic. If anything worsens or changes patient will contact our office for additional recommendations. [name], [name] Electronic Signature(s) Signed: 07/01/2019 8:46:14 AM By: Worthy Keeler PA-C Entered By: Worthy Keeler on 07/01/2019 08:46:14 [name], [name] -------------------------------------------------------------------------------- SuperBill Details Patient Name: [name] Date of Service: 06/29/2019 Medical Record Number: [id number] Patient Account Number: [id number] Date of Birth/Sex: 28-Jun-1945 (74 y.o. F) Treating RN: Montey Hora Primary Care Provider: Clarene Reamer Other Clinician: Referring Provider: Clarene Reamer Treating Provider/Extender: Melburn Hake, Likisha Alles Weeks in Treatment: 6 Diagnosis Coding ICD-10 Codes Code Description I89.0 Lymphedema, not elsewhere classified L97.822 Non-pressure chronic ulcer of  other part of left lower leg with fat layer exposed L97.322 Non-pressure chronic ulcer of left ankle with fat layer exposed L97.821 Non-pressure chronic ulcer of other part of left lower leg limited to breakdown of skin I10 Essential (primary) hypertension Facility Procedures CPT4 Code: [id number] Description: (Facility Use Only) [phone] - [place] LWR LT LEG Modifier: Quantity: 1 Physician Procedures CPT4 [id number] Description: [id number] - WC PHYS LEVEL 3 - EST PT Modifier: Quantity: 1 CPT4 Code: Description: ICD-10 Diagnosis Description I89.0 Lymphedema, not elsewhere classified L97.822 Non-pressure chronic ulcer of other part of left lower leg with fat layer ex L97.322 Non-pressure chronic ulcer of left ankle with fat layer exposed L97.821  Non-pressure chronic ulcer of other part of left lower leg limited to breakd Modifier: posed own of skin Quantity: Electronic Signature(s) Signed: 07/02/2019 10:31:16 AM By: Worthy Keeler PA-C Previous Signature: 06/29/2019 8:50:12 AM Version By: Montey Hora Entered By: Worthy Keeler on 06/29/2019 23:45:46

## 2019-06-30 NOTE — Progress Notes (Signed)
[name] Visit Report for 06/29/2019 Arrival Information Details Patient Name: [name], [name]. Date of Service: 06/29/2019 8:00 AM Medical Record Number: [id number] Patient Account Number: [id number] Date of Birth/Sex: 26-Mar-1945 (74 y.o. F) Treating RN: Army Melia Primary Care Abdirizak Richison: Clarene Reamer Other Clinician: Referring Amaziah Ghosh: Clarene Reamer Treating Kylieann Eagles/Extender: Melburn Hake, HOYT Weeks in Treatment: 6 Visit Information History Since Last Visit Added or deleted any medications: No Patient Arrived: Ambulatory Any new allergies or adverse reactions: No Arrival Time: 08:03 Had a fall or experienced change in No Accompanied By: self activities of daily living that may affect Transfer Assistance: None risk of falls: Patient Identification Verified: Yes Signs or symptoms of abuse/neglect since last visito No Hospitalized since last visit: No Has Dressing in Place as Prescribed: Yes Pain Present Now: No Electronic Signature(s) Signed: 06/29/2019 3:54:04 PM By: Army Melia Entered By: Army Melia on 06/29/2019 08:04:00 [name], [name] -------------------------------------------------------------------------------- Compression Therapy Details Patient Name: [name]. Date of Service: 06/29/2019 8:00 AM Medical Record Number: [id number] Patient Account Number: [id number] Date of Birth/Sex: [birth year] (74 y.o. F) Treating RN: Montey Hora Primary Care Ravin Bendall: Clarene Reamer Other Clinician: Referring Dorea Duff: Clarene Reamer Treating Mathew Postiglione/Extender: Melburn Hake, HOYT Weeks in Treatment: 6 Compression Therapy Performed for Wound Assessment: Wound #1 Left,Medial Lower Leg Performed By: Clinician Montey Hora, RN Compression Type: Rolena Infante Pre Treatment ABI: 1.2 Post Procedure Diagnosis Same as Pre-procedure Electronic Signature(s) Signed: 06/29/2019 8:49:56 AM By: Montey Hora Entered By: Montey Hora on  06/29/2019 08:49:56 [name], [name] -------------------------------------------------------------------------------- Compression Therapy Details Patient Name: [name] Date of Service: 06/29/2019 8:00 AM Medical Record Number: [id number] Patient Account Number: [id number] Date of Birth/Sex: 1945-12-03 (74 y.o. F) Treating RN: Montey Hora Primary Care Haadiya Frogge: Clarene Reamer Other Clinician: Referring Atharva Mirsky: Clarene Reamer Treating Roseana Rhine/Extender: Melburn Hake, HOYT Weeks in Treatment: 6 Compression Therapy Performed for Wound Assessment: Wound #3 Left,Medial Ankle Performed By: Clinician Montey Hora, RN Compression Type: Rolena Infante Pre Treatment ABI: 1.2 Post Procedure Diagnosis Same as Pre-procedure Electronic Signature(s) Signed: 06/29/2019 8:49:56 AM By: Montey Hora Entered By: Montey Hora on 06/29/2019 08:49:56 [name], [name] -------------------------------------------------------------------------------- Compression Therapy Details Patient Name: [name] Date of Service: 06/29/2019 8:00 AM Medical Record Number: [id number] Patient Account Number: [id number] Date of Birth/Sex: 1945-06-09 (74 y.o. F) Treating RN: Montey Hora Primary Care Camillo Quadros: Clarene Reamer Other Clinician: Referring Hernandez Losasso: Clarene Reamer Treating Neilani Duffee/Extender: Melburn Hake, HOYT Weeks in Treatment: 6 Compression Therapy Performed for Wound Assessment: Wound #4 Left,Lateral Lower Leg Performed By: Clinician Montey Hora, RN Compression Type: Rolena Infante Pre Treatment ABI: 1.2 Post Procedure Diagnosis Same as Pre-procedure Electronic Signature(s) Signed: 06/29/2019 8:49:56 AM By: Montey Hora Entered By: Montey Hora on 06/29/2019 08:49:56 [name], [name] -------------------------------------------------------------------------------- Encounter Discharge Information Details Patient Name: [name]. Date of  Service: 06/29/2019 8:00 AM Medical Record Number: [id number] Patient Account Number: [id number] Date of Birth/Sex: 1945-04-30 (74 y.o. F) Treating RN: Montey Hora Primary Care Kenyia Wambolt: Clarene Reamer Other Clinician: Referring Rima Blizzard: Clarene Reamer Treating Julies Carmickle/Extender: Sharalyn Ink in Treatment: 6 Encounter Discharge Information Items Discharge Condition: Stable Ambulatory Status: Ambulatory Discharge Destination: Home Transportation: Private Auto Accompanied By: self Schedule Follow-up Appointment: Yes Clinical Summary of Care: Electronic Signature(s) Signed: 06/29/2019 8:51:26 AM By: Montey Hora Entered By: Montey Hora on 06/29/2019 08:51:25 [name], [name] -------------------------------------------------------------------------------- Lower Extremity Assessment Details Patient Name: [name]. Date of Service: 06/29/2019 8:00 AM Medical Record Number: [id number] Patient Account Number:  [id number] Date of Birth/Sex: [birth year] (74 y.o. F) Treating RN: Army Melia Primary Care Cathline Dowen: Clarene Reamer Other Clinician: Referring Indiah Heyden: Clarene Reamer Treating Jocelynn Gioffre/Extender: Melburn Hake, HOYT Weeks in Treatment: 6 Edema Assessment Assessed: [Left: No] [Right: No] Edema: [Left: Ye] [Right: s] Calf Left: Right: Point of Measurement: 32 cm From Medial Instep 53 cm cm Ankle Left: Right: Point of Measurement: 10 cm From Medial Instep 27 cm cm Vascular Assessment Pulses: Dorsalis Pedis Palpable: [Left:Yes] Electronic Signature(s) Signed: 06/29/2019 3:54:04 PM By: Army Melia Entered By: Army Melia on 06/29/2019 08:14:03 [name], [name] -------------------------------------------------------------------------------- Multi Wound Chart Details Patient Name: [name]. Date of Service: 06/29/2019 8:00 AM Medical Record Number: [id number] Patient Account Number: [id number] Date of Birth/Sex: 1945/05/01  (74 y.o. F) Treating RN: Montey Hora Primary Care Antoinette Borgwardt: Clarene Reamer Other Clinician: Referring Tiffiny Worthy: Clarene Reamer Treating Fiorella Hanahan/Extender: Melburn Hake, HOYT Weeks in Treatment: 6 Vital Signs Height(in): 62 Pulse(bpm): 54 Weight(lbs): 380 Blood Pressure(mmHg): 131/63 Body Mass Index(BMI): 69 Temperature(F): 97.9 Respiratory Rate(breaths/min): 16 Photos: Wound Location: Left, Medial Lower Leg Left, Medial Lower Leg Left, Lateral Lower Leg Wounding Event: Gradually Appeared Gradually Appeared Gradually Appeared Primary Etiology: Lymphedema Venous Leg Ulcer Venous Leg Ulcer Comorbid History: Hypertension Hypertension Hypertension Date Acquired: 08/24/2018 06/29/2019 06/24/2019 Weeks of Treatment: 6 0 0 Wound Status: Open Open Open Measurements L x W x D (cm) 1x1x0.1 1x0.4x0.1 1.2x4x0.1 Area (cm) : 0.785 0.314 3.77 Volume (cm) : 0.079 0.031 0.377 % Reduction in Area: -56.10% N/A N/A % Reduction in Volume: 47.70% N/A N/A Classification: Full Thickness Without Exposed Full Thickness Without Exposed Full Thickness Without Exposed Support Structures Support Structures Support Structures Exudate Amount: Medium Medium Medium Exudate Type: Serosanguineous Serosanguineous Serosanguineous Exudate Color: red, brown red, brown red, brown Wound Margin: Flat and Intact N/A N/A Granulation Amount: Medium (34-66%) Small (1-33%) Large (67-100%) Granulation Quality: Pink Pink Red Necrotic Amount: Medium (34-66%) Large (67-100%) None Present (0%) Exposed Structures: Fat Layer (Subcutaneous Tissue) Fat Layer (Subcutaneous Tissue) Fat Layer (Subcutaneous Tissue) Exposed: Yes Exposed: Yes Exposed: Yes Fascia: No Fascia: No Fascia: No Tendon: No Tendon: No Tendon: No Muscle: No Muscle: No Muscle: No Joint: No Joint: No Joint: No Bone: No Bone: No Bone: No Epithelialization: None None None Treatment Notes Electronic Signature(s) Signed: 06/29/2019 4:31:26 PM By: Montey Hora Entered By: Montey Hora on 06/29/2019 08:33:42 [name], [name] [name], [name] -------------------------------------------------------------------------------- Multi-Disciplinary Care Plan Details Patient Name: [name]. Date of Service: 06/29/2019 8:00 AM Medical Record Number: [id number] Patient Account Number: [id number] Date of Birth/Sex: 10-Oct-1945 (74 y.o. F) Treating RN: Montey Hora Primary Care Zayvian Mcmurtry: Clarene Reamer Other Clinician: Referring Luvern Mcisaac: Clarene Reamer Treating Xyon Lukasik/Extender: Melburn Hake, HOYT Weeks in Treatment: 6 Active Inactive Orientation to the Wound Care Program Nursing Diagnoses: Knowledge deficit related to the wound healing center program Goals: Patient/caregiver will verbalize understanding of the [facility] Date Initiated: 05/17/2019 Target Resolution Date: 06/04/2019 Goal Status: Active Interventions: Provide education on orientation to the wound center Notes: Venous Leg Ulcer Nursing Diagnoses: Knowledge deficit related to disease process and management Goals: Patient will maintain optimal edema control Date Initiated: 06/08/2019 Target Resolution Date: 08/28/2019 Goal Status: Active Interventions: Compression as ordered Notes: Wound/Skin Impairment Nursing Diagnoses: Impaired tissue integrity Goals: Ulcer/skin breakdown will have a volume reduction of 30% by week 4 Date Initiated: 05/17/2019 Target Resolution Date: 06/05/2019 Goal Status: Active Interventions: Assess ulceration(s) every visit Notes: Electronic Signature(s) Signed: 06/29/2019 4:31:26 PM By: Montey Hora Entered By: Montey Hora  on 06/29/2019 08:29:47 SALINA, BLYTHER ([id number], ZIVKOVICH ([id number]) -------------------------------------------------------------------------------- Pain Assessment Details Patient Name: [name], [name]. Date of Service: 06/29/2019 8:00 AM Medical Record  Number: [id number] Patient Account Number: [id number] Date of Birth/Sex: Jul 31, 1945 (74 y.o. F) Treating RN: Army Melia Primary Care Rufina Kimery: Clarene Reamer Other Clinician: Referring Johsua Shevlin: Clarene Reamer Treating Chamille Werntz/Extender: Melburn Hake, HOYT Weeks in Treatment: 6 Active Problems Location of Pain Severity and Description of Pain Patient Has Paino No Site Locations Pain Management and Medication Current Pain Management: Electronic Signature(s) Signed: 06/29/2019 3:54:04 PM By: Army Melia Entered By: Army Melia on 06/29/2019 08:08:53 Welden, [name] -------------------------------------------------------------------------------- Patient/Caregiver Education Details Patient Name: [name]. Date of Service: 06/29/2019 8:00 AM Medical Record Number: [id number] Patient Account Number: [id number] Date of Birth/Gender: 1945/10/07 (74 y.o. F) Treating RN: Montey Hora Primary Care Physician: Clarene Reamer Other Clinician: Referring Physician: Clarene Reamer Treating Physician/Extender: Sharalyn Ink in Treatment: 6 Education Assessment Education Provided To: Patient Education Topics Provided Venous: Handouts: Other: need for ongoing comression Electronic Signature(s) Signed: 06/29/2019 4:31:26 PM By: Montey Hora Entered By: Montey Hora on 06/29/2019 08:50:35 [name], [name] -------------------------------------------------------------------------------- Wound Assessment Details Patient Name: [name]. Date of Service: 06/29/2019 8:00 AM Medical Record Number: [id number] Patient Account Number: [id number] Date of Birth/Sex: [birth year] (74 y.o. F) Treating RN: Army Melia Primary Care Osha Errico: Clarene Reamer Other Clinician: Referring Ruairi Stutsman: Clarene Reamer Treating Jawana Reagor/Extender: Melburn Hake, HOYT Weeks in Treatment: 6 Wound Status Wound Number: 1 Primary Etiology: Lymphedema Wound Location:  Left, Medial Lower Leg Wound Status: Open Wounding Event: Gradually Appeared Comorbid History: Hypertension Date Acquired: 08/24/2018 Weeks Of Treatment: 6 Clustered Wound: No Photos Wound Measurements Length: (cm) 1 Width: (cm) 1 Depth: (cm) 0.1 Area: (cm) 0.785 Volume: (cm) 0.079 % Reduction in Area: -56.1% % Reduction in Volume: 47.7% Epithelialization: None Wound Description Classification: Full Thickness Without Exposed Support Structures Wound Margin: Flat and Intact Exudate Amount: Medium Exudate Type: Serosanguineous Exudate Color: red, brown Foul Odor After Cleansing: No Slough/Fibrino Yes Wound Bed Granulation Amount: Medium (34-66%) Exposed Structure Granulation Quality: Pink Fascia Exposed: No Necrotic Amount: Medium (34-66%) Fat Layer (Subcutaneous Tissue) Exposed: Yes Necrotic Quality: Adherent Slough Tendon Exposed: No Muscle Exposed: No Joint Exposed: No Bone Exposed: No Treatment Notes Wound #1 (Left, Medial Lower Leg) Notes silvercel on left lateral, prisma on others, ABD, unna boot with tubi g at the top Electronic Signature(s) Signed: 06/29/2019 3:54:04 PM By: Verl Dicker, [name] Entered By: Army Melia on 06/29/2019 08:13:36 [name], [name] -------------------------------------------------------------------------------- Wound Assessment Details Patient Name: Alyssa Reges C. Date of Service: 06/29/2019 8:00 AM Medical Record Number: [id number] Patient Account Number: [id number] Date of Birth/Sex: November 22, 1945 (74 y.o. F) Treating RN: Army Melia Primary Care Porfirio Bollier: Clarene Reamer Other Clinician: Referring Oakley Kossman: Clarene Reamer Treating Paxtyn Boyar/Extender: Melburn Hake, HOYT Weeks in Treatment: 6 Wound Status Wound Number: 3 Primary Etiology: Venous Leg Ulcer Wound Location: Left, Medial Lower Leg Wound Status: Open Wounding Event: Gradually Appeared Comorbid History: Hypertension Date Acquired:  06/29/2019 Weeks Of Treatment: 0 Clustered Wound: No Photos Wound Measurements Length: (cm) 1 Width: (cm) 0.4 Depth: (cm) 0.1 Area: (cm) 0.314 Volume: (cm) 0.031 % Reduction in Area: % Reduction in Volume: Epithelialization: None Tunneling: No Undermining: No Wound Description Classification: Full Thickness Without Exposed Support Structu Exudate Amount: Medium Exudate Type: Serosanguineous Exudate Color: red, brown res Foul Odor After Cleansing: No Slough/Fibrino Yes Wound Bed Granulation Amount: Small (1-33%) Exposed Structure Granulation Quality: Pink Fascia  Exposed: No Necrotic Amount: Large (67-100%) Fat Layer (Subcutaneous Tissue) Exposed: Yes Necrotic Quality: Adherent Slough Tendon Exposed: No Muscle Exposed: No Joint Exposed: No Bone Exposed: No Electronic Signature(s) Signed: 06/29/2019 3:54:04 PM By: Army Melia Entered By: Army Melia on 06/29/2019 08:11:54 [name], [name] -------------------------------------------------------------------------------- Wound Assessment Details Patient Name: Alyssa Reges C. Date of Service: 06/29/2019 8:00 AM Medical Record Number: [id number] Patient Account Number: [id number] Date of Birth/Sex: 1945/09/08 (74 y.o. F) Treating RN: Army Melia Primary Care Javonne Louissaint: Clarene Reamer Other Clinician: Referring Teneshia Hedeen: Clarene Reamer Treating Anthony Tamburo/Extender: Melburn Hake, HOYT Weeks in Treatment: 6 Wound Status Wound Number: 4 Primary Etiology: Venous Leg Ulcer Wound Location: Left, Lateral Lower Leg Wound Status: Open Wounding Event: Gradually Appeared Comorbid History: Hypertension Date Acquired: 06/24/2019 Weeks Of Treatment: 0 Clustered Wound: No Photos Wound Measurements Length: (cm) 1.2 Width: (cm) 4 Depth: (cm) 0.1 Area: (cm) 3.77 Volume: (cm) 0.377 % Reduction in Area: % Reduction in Volume: Epithelialization: None Tunneling: No Undermining: No Wound Description Classification:  Full Thickness Without Exposed Support Struc Exudate Amount: Medium Exudate Type: Serosanguineous Exudate Color: red, brown tures Foul Odor After Cleansing: No Slough/Fibrino Yes Wound Bed Granulation Amount: Large (67-100%) Exposed Structure Granulation Quality: Red Fascia Exposed: No Necrotic Amount: None Present (0%) Fat Layer (Subcutaneous Tissue) Exposed: Yes Tendon Exposed: No Muscle Exposed: No Joint Exposed: No Bone Exposed: No Treatment Notes Wound #4 (Left, Lateral Lower Leg) Notes silvercel on left lateral, prisma on others, ABD, unna boot with tubi g at the top Electronic Signature(s) Signed: 06/29/2019 3:54:04 PM By: Verl Dicker, [name] Entered By: Army Melia on 06/29/2019 08:13:14 Shiraishi, [name] -------------------------------------------------------------------------------- Vitals Details Patient Name: [name]. Date of Service: 06/29/2019 8:00 AM Medical Record Number: [id number] Patient Account Number: [id number] Date of Birth/Sex: [birth year] (74 y.o. F) Treating RN: Army Melia Primary Care Senaida Chilcote: Clarene Reamer Other Clinician: Referring Carlson Belland: Clarene Reamer Treating Posey Petrik/Extender: Melburn Hake, HOYT Weeks in Treatment: 6 Vital Signs Time Taken: 08:04 Temperature (F): 97.9 Height (in): 62 Pulse (bpm): 69 Weight (lbs): 380 Respiratory Rate (breaths/min): 16 Body Mass Index (BMI): 69.5 Blood Pressure (mmHg): 131/63 Reference Range: 80 - 120 mg / dl Electronic Signature(s) Signed: 06/29/2019 3:54:04 PM By: Army Melia Entered By: Army Melia on 06/29/2019 08:04:29

## 2019-07-02 ENCOUNTER — Other Ambulatory Visit: Payer: Self-pay

## 2019-07-02 DIAGNOSIS — L97822 Non-pressure chronic ulcer of other part of left lower leg with fat layer exposed: Secondary | ICD-10-CM | POA: Diagnosis not present

## 2019-07-02 DIAGNOSIS — Z6841 Body Mass Index (BMI) 40.0 and over, adult: Secondary | ICD-10-CM | POA: Diagnosis not present

## 2019-07-02 DIAGNOSIS — I10 Essential (primary) hypertension: Secondary | ICD-10-CM | POA: Diagnosis not present

## 2019-07-02 DIAGNOSIS — I890 Lymphedema, not elsewhere classified: Secondary | ICD-10-CM | POA: Diagnosis not present

## 2019-07-02 DIAGNOSIS — L97322 Non-pressure chronic ulcer of left ankle with fat layer exposed: Secondary | ICD-10-CM | POA: Diagnosis not present

## 2019-07-02 DIAGNOSIS — L97821 Non-pressure chronic ulcer of other part of left lower leg limited to breakdown of skin: Secondary | ICD-10-CM | POA: Diagnosis not present

## 2019-07-02 NOTE — Progress Notes (Signed)
[name] Visit Report for 07/02/2019 Arrival Information Details Patient Name: [name]. Date of Service: 07/02/2019 8:00 AM Medical Record Number: [id number] Patient Account Number: [id number] Date of Birth/Sex: March 29, 1945 (73 y.o. F) Treating RN: Cornell Barman Primary Care Dorette Hartel: Clarene Reamer Other Clinician: Referring Bekka Qian: Clarene Reamer Treating Kento Gossman/Extender: Melburn Hake, HOYT Weeks in Treatment: 6 Visit Information History Since Last Visit Added or deleted any medications: No Patient Arrived: Ambulatory Pain Present Now: Yes Arrival Time: 08:08 Accompanied By: self Transfer Assistance: None Patient Identification Verified: Yes Secondary Verification Process Completed: Yes Electronic Signature(s) Signed: 07/02/2019 10:29:18 AM By: Gretta Cool, BSN, RN, CWS, Kim RN, BSN Entered By: Gretta Cool, BSN, RN, CWS, Kim on 07/02/2019 08:10:27 [name] -------------------------------------------------------------------------------- Compression Therapy Details Patient Name: [name] Date of Service: 07/02/2019 8:00 AM Medical Record Number: [id number] Patient Account Number: [id number] Date of Birth/Sex: 09/03/1945 (73 y.o. F) Treating RN: Cornell Barman Primary Care Jazir Newey: Clarene Reamer Other Clinician: Referring Felicite Zeimet: Clarene Reamer Treating Del Overfelt/Extender: Melburn Hake, HOYT Weeks in Treatment: 6 Compression Therapy Performed for Wound Assessment: Wound #1 Left,Medial Lower Leg Performed By: Clinician Cornell Barman, RN Compression Type: Rolena Infante Pre Treatment ABI: 1.2 Electronic Signature(s) Signed: 07/02/2019 10:29:18 AM By: Gretta Cool, BSN, RN, CWS, Kim RN, BSN Entered By: Gretta Cool, BSN, RN, CWS, Kim on 07/02/2019 08:24:36 [name] -------------------------------------------------------------------------------- Compression Therapy Details Patient Name: [name]. Date of Service: 07/02/2019 8:00  AM Medical Record Number: [id number] Patient Account Number: [id number] Date of Birth/Sex: [birth year] (73 y.o. F) Treating RN: Cornell Barman Primary Care Annaleigh Steinmeyer: Clarene Reamer Other Clinician: Referring Leeann Bady: Clarene Reamer Treating Shontay Wallner/Extender: Melburn Hake, HOYT Weeks in Treatment: 6 Compression Therapy Performed for Wound Assessment: Wound #3 Left,Medial Ankle Performed By: Clinician Cornell Barman, RN Compression Type: Rolena Infante Pre Treatment ABI: 1.2 Electronic Signature(s) Signed: 07/02/2019 10:29:18 AM By: Gretta Cool, BSN, RN, CWS, Kim RN, BSN Entered By: Gretta Cool, BSN, RN, CWS, Kim on 07/02/2019 08:24:36 [name] -------------------------------------------------------------------------------- Compression Therapy Details Patient Name: [name]. Date of Service: 07/02/2019 8:00 AM Medical Record Number: [id number] Patient Account Number: [id number] Date of Birth/Sex: [birth year] (73 y.o. F) Treating RN: Cornell Barman Primary Care Valori Hollenkamp: Clarene Reamer Other Clinician: Referring Gryffin Altice: Clarene Reamer Treating Jazell Rosenau/Extender: Melburn Hake, HOYT Weeks in Treatment: 6 Compression Therapy Performed for Wound Assessment: Wound #4 Left,Lateral Lower Leg Performed By: Clinician Cornell Barman, RN Compression Type: Rolena Infante Pre Treatment ABI: 1.2 Electronic Signature(s) Signed: 07/02/2019 10:29:18 AM By: Gretta Cool, BSN, RN, CWS, Kim RN, BSN Entered By: Gretta Cool, BSN, RN, CWS, Kim on 07/02/2019 08:24:36 [name] -------------------------------------------------------------------------------- Encounter Discharge Information Details Patient Name: [name] Date of Service: 07/02/2019 8:00 AM Medical Record Number: [id number] Patient Account Number: [id number] Date of Birth/Sex: 07-23-1945 (73 y.o. F) Treating RN: Cornell Barman Primary Care Carrson Lightcap: Clarene Reamer Other Clinician: Referring Millan Legan: Clarene Reamer Treating  Carolos Fecher/Extender: Sharalyn Ink in Treatment: 6 Encounter Discharge Information Items Discharge Condition: Stable Ambulatory Status: Ambulatory Discharge Destination: Home Transportation: Private Auto Accompanied By: self Schedule Follow-up Appointment: Yes Clinical Summary of Care: Electronic Signature(s) Signed: 07/02/2019 10:29:18 AM By: Gretta Cool, BSN, RN, CWS, Kim RN, BSN Entered By: Gretta Cool, BSN, RN, CWS, Kim on 07/02/2019 08:27:21 [name] -------------------------------------------------------------------------------- Wound Assessment Details Patient Name: [name], [name] Date of Service: 07/02/2019 8:00 AM Medical Record Number: [id number] Patient Account Number: [id number] Date of Birth/Sex: 1945-05-17 (73 y.o. F) Treating RN: Cornell Barman Primary Care Alexiana Laverdure: Clarene Reamer Other Clinician: Referring  Jacquelynn Friend: Clarene Reamer Treating Shaterrica Territo/Extender: Melburn Hake, HOYT Weeks in Treatment: 6 Wound Status Wound Number: 1 Primary Etiology: Lymphedema Wound Location: Left, Medial Lower Leg Wound Status: Open Wounding Event: Gradually Appeared Comorbid History: Hypertension Date Acquired: 08/24/2018 Weeks Of Treatment: 6 Clustered Wound: No Photos Photo Uploaded By: Gretta Cool, BSN, RN, CWS, Kim on 07/02/2019 08:30:16 Wound Measurements Length: (cm) 1.5 Width: (cm) 0.6 Depth: (cm) 0.1 Area: (cm) 0.707 Volume: (cm) 0.071 % Reduction in Area: -40.6% % Reduction in Volume: 53% Epithelialization: Small (1-33%) Wound Description Classification: Full Thickness Without Exposed Support Structu Wound Margin: Flat and Intact Exudate Amount: Medium Exudate Type: Serosanguineous Exudate Color: red, brown res Foul Odor After Cleansing: No Slough/Fibrino Yes Wound Bed Granulation Amount: Medium (34-66%) Exposed Structure Granulation Quality: Pink Fascia Exposed: No Necrotic Amount: Small (1-33%) Fat Layer (Subcutaneous Tissue) Exposed:  Yes Necrotic Quality: Adherent Slough Tendon Exposed: No Muscle Exposed: No Joint Exposed: No Bone Exposed: No Treatment Notes Wound #1 (Left, Medial Lower Leg) 1. Cleansed with: Cleanse wound with antibacterial soap and water 3. Peri-wound Care: Antifungal cream 4. Dressing Applied: JOSINA, MACDONNELL ([id number]) Other dressing (specify in notes) 7. Secured with Rolena Infante to Left Lower Extremity Notes [employer], Product manager) Signed: 07/02/2019 10:29:18 AM By: Gretta Cool, BSN, RN, CWS, Kim RN, BSN Entered By: Gretta Cool, BSN, RN, CWS, Kim on 07/02/2019 08:21:04 [name] -------------------------------------------------------------------------------- Wound Assessment Details Patient Name: ZYANNAH, BURLEW C. Date of Service: 07/02/2019 8:00 AM Medical Record Number: [id number] Patient Account Number: [id number] Date of Birth/Sex: 21-Feb-1946 (73 y.o. F) Treating RN: Cornell Barman Primary Care Tesslyn Baumert: Clarene Reamer Other Clinician: Referring Zainab Crumrine: Clarene Reamer Treating Abbigaile Rockman/Extender: Melburn Hake, HOYT Weeks in Treatment: 6 Wound Status Wound Number: 3 Primary Etiology: Venous Leg Ulcer Wound Location: Left, Medial Ankle Wound Status: Open Wounding Event: Gradually Appeared Comorbid History: Hypertension Date Acquired: 06/29/2019 Weeks Of Treatment: 0 Clustered Wound: No Photos Photo Uploaded By: Gretta Cool, BSN, RN, CWS, Kim on 07/02/2019 08:30:16 Wound Measurements Length: (cm) 0.9 Width: (cm) 1.9 Depth: (cm) 0.1 Area: (cm) 1.343 Volume: (cm) 0.134 % Reduction in Area: -327.7% % Reduction in Volume: -332.3% Epithelialization: Medium (34-66%) Tunneling: No Undermining: No Wound Description Classification: Full Thickness Without Exposed Support Structu Exudate Amount: Medium Exudate Type: Serosanguineous Exudate Color: red, brown res Foul Odor After Cleansing: No Slough/Fibrino Yes Wound Bed Granulation Amount: Small (1-33%)  Exposed Structure Granulation Quality: Pink Fascia Exposed: No Necrotic Amount: Large (67-100%) Fat Layer (Subcutaneous Tissue) Exposed: Yes Necrotic Quality: Adherent Slough Tendon Exposed: No Muscle Exposed: No Joint Exposed: No Bone Exposed: No Treatment Notes Wound #3 (Left, Medial Ankle) 1. Cleansed with: Cleanse wound with antibacterial soap and water 3. Peri-wound Care: Antifungal cream 4. Dressing Applied: Other dressing (specify in notes) Mantia, Yoselyn C. ([id number]) 7. Secured with Rolena Infante to Left Lower Extremity Notes [employer], Product manager) Signed: 07/02/2019 10:29:18 AM By: Gretta Cool, BSN, RN, CWS, Kim RN, BSN Entered By: Gretta Cool, BSN, RN, CWS, Kim on 07/02/2019 08:22:26 [name] -------------------------------------------------------------------------------- Wound Assessment Details Patient Name: HENNESY, ICENHOWER C. Date of Service: 07/02/2019 8:00 AM Medical Record Number: [id number] Patient Account Number: [id number] Date of Birth/Sex: 11-Jan-1946 (73 y.o. F) Treating RN: Cornell Barman Primary Care Chessie Neuharth: Clarene Reamer Other Clinician: Referring Vaneza Pickart: Clarene Reamer Treating Deyjah Kindel/Extender: Melburn Hake, HOYT Weeks in Treatment: 6 Wound Status Wound Number: 4 Primary Etiology: Venous Leg Ulcer Wound Location: Left, Lateral Lower Leg Wound Status: Open Wounding Event: Gradually Appeared Comorbid History: Hypertension Date Acquired: 06/24/2019 Weeks Of  Treatment: 0 Clustered Wound: No Photos Photo Uploaded By: Gretta Cool, BSN, RN, CWS, Kim on 07/02/2019 08:30:29 Wound Measurements Length: (cm) 0.8 Width: (cm) 3.5 Depth: (cm) 0.1 Area: (cm) 2.199 Volume: (cm) 0.22 % Reduction in Area: 41.7% % Reduction in Volume: 41.6% Epithelialization: None Wound Description Classification: Full Thickness Without Exposed Support Structu Exudate Amount: Medium Exudate Type: Serosanguineous Exudate Color: red, brown res  Foul Odor After Cleansing: No Slough/Fibrino No Wound Bed Granulation Amount: Large (67-100%) Exposed Structure Granulation Quality: Red Fascia Exposed: No Necrotic Amount: None Present (0%) Fat Layer (Subcutaneous Tissue) Exposed: Yes Tendon Exposed: No Muscle Exposed: No Joint Exposed: No Bone Exposed: No Treatment Notes Wound #4 (Left, Lateral Lower Leg) 1. Cleansed with: Cleanse wound with antibacterial soap and water 3. Peri-wound Care: Antifungal cream 4. Dressing Applied: Other dressing (specify in notes) Dejaynes, Ivie C. ([id number]) 7. Secured with Rolena Infante to Left Lower Extremity Notes [employer], Product manager) Signed: 07/02/2019 10:29:18 AM By: Gretta Cool, BSN, RN, CWS, Kim RN, BSN Entered By: Gretta Cool, BSN, RN, CWS, Kim on 07/02/2019 08:22:43

## 2019-07-06 ENCOUNTER — Encounter: Payer: Medicare Other | Admitting: Physician Assistant

## 2019-07-06 ENCOUNTER — Other Ambulatory Visit: Payer: Self-pay

## 2019-07-06 DIAGNOSIS — L97322 Non-pressure chronic ulcer of left ankle with fat layer exposed: Secondary | ICD-10-CM | POA: Diagnosis not present

## 2019-07-06 DIAGNOSIS — L97822 Non-pressure chronic ulcer of other part of left lower leg with fat layer exposed: Secondary | ICD-10-CM | POA: Diagnosis not present

## 2019-07-06 DIAGNOSIS — I10 Essential (primary) hypertension: Secondary | ICD-10-CM | POA: Diagnosis not present

## 2019-07-06 DIAGNOSIS — L97821 Non-pressure chronic ulcer of other part of left lower leg limited to breakdown of skin: Secondary | ICD-10-CM | POA: Diagnosis not present

## 2019-07-06 DIAGNOSIS — Z6841 Body Mass Index (BMI) 40.0 and over, adult: Secondary | ICD-10-CM | POA: Diagnosis not present

## 2019-07-06 DIAGNOSIS — I890 Lymphedema, not elsewhere classified: Secondary | ICD-10-CM | POA: Diagnosis not present

## 2019-07-06 NOTE — Progress Notes (Addendum)
HARINDER, ISKANDER ([id number]) Visit Report for 07/06/2019 Chief Complaint Document Details Patient Name: [name], [name]. Date of Service: 07/06/2019 8:00 AM Medical Record Number: [id number] Patient Account Number: [id number] Date of Birth/Sex: 1945/11/20 (74 y.o. F) Treating RN: Montey Hora Primary Care Provider: Clarene Reamer Other Clinician: Referring Provider: Clarene Reamer Treating Provider/Extender: Melburn Hake, HOYT Weeks in Treatment: 7 Information Obtained from: Patient Chief Complaint Left LE and ankle ulcers Electronic Signature(s) Signed: 07/06/2019 8:24:23 AM By: Worthy Keeler PA-C Entered By: Worthy Keeler on 07/06/2019 08:24:22 LEVEE, GARROD ([id number]) -------------------------------------------------------------------------------- HPI Details Patient Name: [name] Date of Service: 07/06/2019 8:00 AM Medical Record Number: [id number] Patient Account Number: [id number] Date of Birth/Sex: [birth year] (74 y.o. F) Treating RN: Montey Hora Primary Care Provider: Clarene Reamer Other Clinician: Referring Provider: Clarene Reamer Treating Provider/Extender: Melburn Hake, HOYT Weeks in Treatment: 7 History of Present Illness HPI Description: 05/17/2019 upon evaluation today patient presents today for initial evaluation here in the clinic regarding her left lower extremity open wound secondary to lymphedema. She has previously been seen at the [facility] clinic 1 time in July 2020 but did not have any follow-up after that point. Subsequently she has not been seen here in [place] prior to today. Currently she tells me that she has been having issues for the past 1-2 months with wounds over the left lower extremity 2 that are more distinct she did has a breakdown of an area which is much more superficial as well on her leg but in general she mainly is dealing with what I would term stage III lymphedema. Fortunately there is no signs of active cellulitis  she does have erythema but I think this is more stasis dermatitis than anything infectious in nature. Fortunately there is no evidence of systemic infection which is also good news. She does have a history of lymphedema though she is never worn compression stockings she tells me. She does have hypertension as well. 05/25/2019 upon evaluation today patient appears to be doing about the same with regard to her wounds upon inspection today. Her edema is somewhat better but still with the larger calf muscle area and smaller area around her ankle it is very difficult to wrap her appropriately without the sliding down. Patient really did not feel like it slid down too much nonetheless it did appear to slide quite a bit based on what we were seeing today. Last Thursday was the same. With that being said she still does need compression we did have a discussion between several of the nurses and myself today with the patient about what the best thing to do may be. 06/01/2019 upon evaluation today patient appears to be doing slightly better in regard to her leg at this point. There are still a lot of irritation but she does not seem to be having as much as much issue with the weeping as compared to last time still we are not really doing a whole lot better as of yet in general. 06/08/2019 upon evaluation today patient actually seems to be making excellent progress in regard to her wounds and her leg in general. The weeping has dramatically improved and overall very pleased with things appear today. There is no signs of active infection which is good news. No fevers, chills, nausea, vomiting, or diarrhea. 06/15/2019 upon evaluation today patient appears to be doing excellent in regard to her left lower extremity. The wounds are measuring smaller and overall things are doing well despite the fact that  again we have had a difficult time getting the appropriate wrap on her. Nonetheless I do believe the Unna boot on the  bottom part with utilizing the Tubigrip at the top has done fairly well and again she seems to be making good progress. There is no signs of active infection at this time. 06/22/2019 upon evaluation today patient appears to be doing better with regard to her wounds on the left lower extremity. She has been tolerating the dressing changes without complication. Fortunately there is no signs of active infection at this time. No fevers, chills, nausea, vomiting, or diarrhea. 06/29/2019 upon evaluation today patient actually appears to be doing better in regard to the overall appearance of her leg ulcer. She has been tolerating the dressing changes without complication. Fortunately there is no signs of active infection at this time. No fevers, chills, nausea, vomiting, or diarrhea. Overall very pleased with how things seem to be progressing. 07/06/2019 upon evaluation today patient appears to be doing excellent with regard to her left lower extremity. In fact 2 of the wound areas we have last week of completely sealed up and the 1 remaining is still showing signs of being open although this is extremely small. Overall the patient is doing well she also received her pumps last week. Today she has somebody coming out to show her how to use these. Electronic Signature(s) Signed: 07/06/2019 8:51:36 AM By: Worthy Keeler PA-C Entered By: Worthy Keeler on 07/06/2019 08:51:36 [name], [name] -------------------------------------------------------------------------------- Physical Exam Details Patient Name: [name]. Date of Service: 07/06/2019 8:00 AM Medical Record Number: [id number] Patient Account Number: [id number] Date of Birth/Sex: 14-Oct-1945 (74 y.o. F) Treating RN: Montey Hora Primary Care Provider: Clarene Reamer Other Clinician: Referring Provider: Clarene Reamer Treating Provider/Extender: Melburn Hake, HOYT Weeks in Treatment: 7 Constitutional Well-nourished and  well-hydrated in no acute distress. Respiratory normal breathing without difficulty. Psychiatric this patient is able to make decisions and demonstrates good insight into disease process. Alert and Oriented x 3. pleasant and cooperative. Notes Patient's wound currently showed signs of good granulation at this time. Fortunately there is no evidence of active infection which is great news. No fevers, chills, nausea, vomiting, or diarrhea. Electronic Signature(s) Signed: 07/06/2019 8:51:51 AM By: Worthy Keeler PA-C Entered By: Worthy Keeler on 07/06/2019 08:51:51 [name], [name] -------------------------------------------------------------------------------- Physician Orders Details Patient Name: [name] Date of Service: 07/06/2019 8:00 AM Medical Record Number: [id number] Patient Account Number: [id number] Date of Birth/Sex: Feb 13, 1946 (74 y.o. F) Treating RN: Montey Hora Primary Care Provider: Clarene Reamer Other Clinician: Referring Provider: Clarene Reamer Treating Provider/Extender: Melburn Hake, HOYT Weeks in Treatment: 7 Verbal / Phone Orders: No Diagnosis Coding ICD-10 Coding Code Description I89.0 Lymphedema, not elsewhere classified L97.822 Non-pressure chronic ulcer of other part of left lower leg with fat layer exposed L97.322 Non-pressure chronic ulcer of left ankle with fat layer exposed L97.821 Non-pressure chronic ulcer of other part of left lower leg limited to breakdown of skin I10 Essential (primary) hypertension Wound Cleansing Wound #1 Left,Medial Lower Leg o Cleanse wound with mild soap and water - in office o May shower with protection. Skin Barriers/Peri-Wound Care Wound #1 Left,Medial Lower Leg o Triamcinolone Acetonide Ointment (TCA) - to lower leg Primary Wound Dressing Wound #1 Left,Medial Lower Leg o Silver Collagen Secondary Dressing Wound #1 Left,Medial Lower Leg o ABD pad Dressing Change Frequency Wound  #1 Left,Medial Lower Leg o Other: - Twice weekly Follow-up Appointments o Return Appointment in 2  weeks. o Nurse Visit as needed - nurse visit twice weekly until next appointment Edema Control Wound #1 Left,Medial Lower Leg o Unna Boot to Left Lower Extremity o Elevate legs to the level of the heart and pump ankles as often as possible o Other: - Double Layer Tubi G at the top of patient's calf Electronic Signature(s) Signed: 07/06/2019 4:09:31 PM By: Montey Hora Signed: 07/06/2019 6:10:11 PM By: Worthy Keeler PA-C Entered By: Montey Hora on 07/06/2019 08:31:07 [name], [name] -------------------------------------------------------------------------------- Problem List Details Patient Name: [name]. Date of Service: 07/06/2019 8:00 AM Medical Record Number: [id number] Patient Account Number: [id number] Date of Birth/Sex: 1945-07-01 (74 y.o. F) Treating RN: Montey Hora Primary Care Provider: Clarene Reamer Other Clinician: Referring Provider: Clarene Reamer Treating Provider/Extender: Melburn Hake, HOYT Weeks in Treatment: 7 Active Problems ICD-10 Evaluated Encounter Code Description Active Date Today Diagnosis I89.0 Lymphedema, not elsewhere classified 05/17/2019 No Yes L97.822 Non-pressure chronic ulcer of other part of left lower leg with fat layer 05/17/2019 No Yes exposed L97.322 Non-pressure chronic ulcer of left ankle with fat layer exposed 05/17/2019 No Yes L97.821 Non-pressure chronic ulcer of other part of left lower leg limited to 05/17/2019 No Yes breakdown of skin I10 Essential (primary) hypertension 05/17/2019 No Yes Inactive Problems Resolved Problems Electronic Signature(s) Signed: 07/06/2019 8:23:44 AM By: Worthy Keeler PA-C Entered By: Worthy Keeler on 07/06/2019 08:23:44 [name], [name] -------------------------------------------------------------------------------- Progress Note Details Patient  Name: [name]. Date of Service: 07/06/2019 8:00 AM Medical Record Number: [id number] Patient Account Number: [id number] Date of Birth/Sex: Jun 01, 1945 (74 y.o. F) Treating RN: Montey Hora Primary Care Provider: Clarene Reamer Other Clinician: Referring Provider: Clarene Reamer Treating Provider/Extender: Melburn Hake, HOYT Weeks in Treatment: 7 Subjective Chief Complaint Information obtained from Patient Left LE and ankle ulcers History of Present Illness (HPI) 05/17/2019 upon evaluation today patient presents today for initial evaluation here in the clinic regarding her left lower extremity open wound secondary to lymphedema. She has previously been seen at the [facility] clinic 1 time in July 2020 but did not have any follow-up after that point. Subsequently she has not been seen here in [place] prior to today. Currently she tells me that she has been having issues for the past 1-2 months with wounds over the left lower extremity 2 that are more distinct she did has a breakdown of an area which is much more superficial as well on her leg but in general she mainly is dealing with what I would term stage III lymphedema. Fortunately there is no signs of active cellulitis she does have erythema but I think this is more stasis dermatitis than anything infectious in nature. Fortunately there is no evidence of systemic infection which is also good news. She does have a history of lymphedema though she is never worn compression stockings she tells me. She does have hypertension as well. 05/25/2019 upon evaluation today patient appears to be doing about the same with regard to her wounds upon inspection today. Her edema is somewhat better but still with the larger calf muscle area and smaller area around her ankle it is very difficult to wrap her appropriately without the sliding down. Patient really did not feel like it slid down too much nonetheless it did appear to slide quite a bit  based on what we were seeing today. Last Thursday was the same. With that being said she still does need compression we did have a discussion between several of the nurses and  myself today with the patient about what the best thing to do may be. 06/01/2019 upon evaluation today patient appears to be doing slightly better in regard to her leg at this point. There are still a lot of irritation but she does not seem to be having as much as much issue with the weeping as compared to last time still we are not really doing a whole lot better as of yet in general. 06/08/2019 upon evaluation today patient actually seems to be making excellent progress in regard to her wounds and her leg in general. The weeping has dramatically improved and overall very pleased with things appear today. There is no signs of active infection which is good news. No fevers, chills, nausea, vomiting, or diarrhea. 06/15/2019 upon evaluation today patient appears to be doing excellent in regard to her left lower extremity. The wounds are measuring smaller and overall things are doing well despite the fact that again we have had a difficult time getting the appropriate wrap on her. Nonetheless I do believe the Unna boot on the bottom part with utilizing the Tubigrip at the top has done fairly well and again she seems to be making good progress. There is no signs of active infection at this time. 06/22/2019 upon evaluation today patient appears to be doing better with regard to her wounds on the left lower extremity. She has been tolerating the dressing changes without complication. Fortunately there is no signs of active infection at this time. No fevers, chills, nausea, vomiting, or diarrhea. 06/29/2019 upon evaluation today patient actually appears to be doing better in regard to the overall appearance of her leg ulcer. She has been tolerating the dressing changes without complication. Fortunately there is no signs of active infection  at this time. No fevers, chills, nausea, vomiting, or diarrhea. Overall very pleased with how things seem to be progressing. 07/06/2019 upon evaluation today patient appears to be doing excellent with regard to her left lower extremity. In fact 2 of the wound areas we have last week of completely sealed up and the 1 remaining is still showing signs of being open although this is extremely small. Overall the patient is doing well she also received her pumps last week. Today she has somebody coming out to show her how to use these. Objective Constitutional Well-nourished and well-hydrated in no acute distress. [name], [name] Vitals Time Taken: 8:06 AM, Height: 62 in, Weight: 380 lbs, BMI: 69.5, Temperature: 97.7 F, Pulse: 67 bpm, Respiratory Rate: 16 breaths/min, Blood Pressure: 146/60 mmHg. Respiratory normal breathing without difficulty. Psychiatric this patient is able to make decisions and demonstrates good insight into disease process. Alert and Oriented x 3. pleasant and cooperative. General Notes: Patient's wound currently showed signs of good granulation at this time. Fortunately there is no evidence of active infection which is great news. No fevers, chills, nausea, vomiting, or diarrhea. Integumentary (Hair, Skin) Wound #1 status is Open. Original cause of wound was Gradually Appeared. The wound is located on the Left,Medial Lower Leg. The wound measures 1cm length x 0.4cm width x 0.1cm depth; 0.314cm^2 area and 0.031cm^3 volume. There is Fat Layer (Subcutaneous Tissue) Exposed exposed. There is a medium amount of serosanguineous drainage noted. The wound margin is flat and intact. There is medium (34-66%) pink granulation within the wound bed. There is a small (1-33%) amount of necrotic tissue within the wound bed including Adherent Slough. Wound #3 status is Healed - Epithelialized. Original cause of wound was Gradually Appeared. The  wound is located on the Left,Medial  Ankle. The wound measures 0cm length x 0cm width x 0cm depth; 0cm^2 area and 0cm^3 volume. There is Fat Layer (Subcutaneous Tissue) Exposed exposed. There is a medium amount of serosanguineous drainage noted. There is small (1-33%) pink granulation within the wound bed. There is a large (67- 100%) amount of necrotic tissue within the wound bed including Adherent Slough. Wound #4 status is Healed - Epithelialized. Original cause of wound was Gradually Appeared. The wound is located on the Left,Lateral Lower Leg. The wound measures 0cm length x 0cm width x 0cm depth; 0cm^2 area and 0cm^3 volume. There is Fat Layer (Subcutaneous Tissue) Exposed exposed. There is a medium amount of serosanguineous drainage noted. There is large (67-100%) red granulation within the wound bed. There is no necrotic tissue within the wound bed. 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 There was a Haematologist Compression Therapy Procedure with a pre-treatment ABI of 1.2 by Montey Hora, RN. Post procedure Diagnosis Wound #1: Same as Pre-Procedure Plan Wound Cleansing: Wound #1 Left,Medial Lower Leg: Cleanse wound with mild soap and water - in office May shower with protection. Skin Barriers/Peri-Wound Care: Wound #1 Left,Medial Lower Leg: [name], [name]. ([id number]) Triamcinolone Acetonide Ointment (TCA) - to lower leg Primary Wound Dressing: Wound #1 Left,Medial Lower Leg: Silver Collagen Secondary Dressing: Wound #1 Left,Medial Lower Leg: ABD pad Dressing Change Frequency: Wound #1 Left,Medial Lower  Leg: Other: - Twice weekly Follow-up Appointments: Return Appointment in 2 weeks. Nurse Visit as needed - nurse visit twice weekly until next appointment Edema Control: Wound #1 Left,Medial Lower Leg: Unna Boot to Left Lower Extremity Elevate legs to the level of the heart and pump ankles as often as possible Other: - Double Layer Tubi G at the top of patient's calf 1. My suggestion at this time is going to be that we go ahead and continue with the current wound care measures which includes utilization of collagen to the open wound remaining. 2. We will also continue to utilize the triamcinolone to the leg to help with her skin quality along with using an ABD pad to cover the wound location and an Unna boot to the left lower extremity along with double layer Tubigrip to the patient's calf region. 3. I would also recommend that we go ahead and continue with elevation is much as possible to try to keep edema under good control. We will see patient back for reevaluation in 1 week here in the clinic. If anything worsens or changes patient will contact our office for additional recommendations. Electronic Signature(s) Signed: 07/06/2019 8:53:09 AM By: Worthy Keeler PA-C Entered By: Worthy Keeler on 07/06/2019 08:53:09 [name], [name] -------------------------------------------------------------------------------- SuperBill Details Patient Name: [name] Date of Service: 07/06/2019 Medical Record Number: [id number] Patient Account Number: [id number] Date of Birth/Sex: 1945/04/09 (74 y.o. F) Treating RN: Montey Hora Primary Care Provider: Clarene Reamer Other Clinician: Referring Provider: Clarene Reamer Treating Provider/Extender: Melburn Hake, HOYT Weeks in Treatment: 7 Diagnosis Coding ICD-10 Codes Code Description I89.0 Lymphedema, not elsewhere classified L97.822 Non-pressure chronic ulcer of other part of left lower leg with fat  layer exposed L97.322  Non-pressure chronic ulcer of left ankle with fat layer exposed L97.821 Non-pressure chronic ulcer of other part of left lower leg limited to breakdown of skin I10 Essential (primary) hypertension Facility Procedures CPT4 Code: [id number] Description: (Facility Use Only) [id number] - APPLY UNNA BOOT LT Modifier: Quantity: 1 Physician Procedures CPT4 [id number] Description: [id number] - WC PHYS LEVEL 3 - EST PT Modifier: Quantity: 1 CPT4 Code: Description: ICD-10 Diagnosis Description I89.0 Lymphedema, not elsewhere classified L97.822 Non-pressure chronic ulcer of other part of left lower leg with fat layer ex L97.322 Non-pressure chronic ulcer of left ankle with fat layer exposed L97.821  Non-pressure chronic ulcer of other part of left lower leg limited to breakd Modifier: posed own of skin Quantity: Electronic Signature(s) Signed: 07/06/2019 8:53:23 AM By: Worthy Keeler PA-C Previous Signature: 07/06/2019 8:46:46 AM Version By: Montey Hora Entered By: Worthy Keeler on 07/06/2019 08:53:23

## 2019-07-06 NOTE — Progress Notes (Signed)
HAILEE, KEITT ([id number]) Visit Report for 07/06/2019 Arrival Information Details Patient Name: [name], [name]. Date of Service: 07/06/2019 8:00 AM Medical Record Number: [id number] Patient Account Number: [id number] Date of Birth/Sex: [birth year] (74 y.o. F) Treating RN: Army Melia Primary Care Devynne Sturdivant: Clarene Reamer Other Clinician: Referring Lakechia Nay: Clarene Reamer Treating Ednah Hammock/Extender: Melburn Hake, HOYT Weeks in Treatment: 7 Visit Information History Since Last Visit Added or deleted any medications: No Patient Arrived: Ambulatory Any new allergies or adverse reactions: No Arrival Time: 08:06 Had a fall or experienced change in No Accompanied By: self activities of daily living that may affect Transfer Assistance: None risk of falls: Patient Identification Verified: Yes Signs or symptoms of abuse/neglect since last visito No Hospitalized since last visit: No Has Dressing in Place as Prescribed: Yes Pain Present Now: No Electronic Signature(s) Signed: 07/06/2019 3:16:17 PM By: Army Melia Entered By: Army Melia on 07/06/2019 08:06:26 [name], [name] ([id number]) -------------------------------------------------------------------------------- Compression Therapy Details Patient Name: [name]. Date of Service: 07/06/2019 8:00 AM Medical Record Number: [id number] Patient Account Number: [id number] Date of Birth/Sex: 01-07-1946 (74 y.o. F) Treating RN: Montey Hora Primary Care Orlandis Sanden: Clarene Reamer Other Clinician: Referring Reba Hulett: Clarene Reamer Treating Johonna Binette/Extender: Melburn Hake, HOYT Weeks in Treatment: 7 Compression Therapy Performed for Wound Assessment: Wound #1 Left,Medial Lower Leg Performed By: Clinician Montey Hora, RN Compression Type: Rolena Infante Pre Treatment ABI: 1.2 Post Procedure Diagnosis Same as Pre-procedure Electronic Signature(s) Signed: 07/06/2019 4:09:31 PM By: Montey Hora Entered By: Montey Hora on  07/06/2019 08:29:35 [name], [name] ([id number]) -------------------------------------------------------------------------------- Encounter Discharge Information Details Patient Name: [name]. Date of Service: 07/06/2019 8:00 AM Medical Record Number: [id number] Patient Account Number: [id number] Date of Birth/Sex: October 19, 1945 (74 y.o. F) Treating RN: Montey Hora Primary Care Stevie Charter: Clarene Reamer Other Clinician: Referring Jastin Fore: Clarene Reamer Treating Bevan Vu/Extender: Sharalyn Ink in Treatment: 7 Encounter Discharge Information Items Discharge Condition: Stable Ambulatory Status: Ambulatory Discharge Destination: Home Transportation: Private Auto Accompanied By: self Schedule Follow-up Appointment: Yes Clinical Summary of Care: Electronic Signature(s) Signed: 07/06/2019 8:48:05 AM By: Montey Hora Entered By: Montey Hora on 07/06/2019 08:48:05 [name], [name] ([id number]) -------------------------------------------------------------------------------- Lower Extremity Assessment Details Patient Name: [name]. Date of Service: 07/06/2019 8:00 AM Medical Record Number: [id number] Patient Account Number: [id number] Date of Birth/Sex: 14-Jul-1945 (74 y.o. F) Treating RN: Army Melia Primary Care Nyeli Holtmeyer: Clarene Reamer Other Clinician: Referring Arley Garant: Clarene Reamer Treating Jasa Dundon/Extender: Melburn Hake, HOYT Weeks in Treatment: 7 Edema Assessment Assessed: [Left: No] [Right: No] Edema: [Left: Ye] [Right: s] Calf Left: Right: Point of Measurement: 32 cm From Medial Instep 52 cm cm Ankle Left: Right: Point of Measurement: 10 cm From Medial Instep 27.5 cm cm Vascular Assessment Pulses: Dorsalis Pedis Palpable: [Left:Yes] Electronic Signature(s) Signed: 07/06/2019 3:16:17 PM By: Army Melia Entered By: Army Melia on 07/06/2019 08:15:14 [name], [name]  ([id number]) -------------------------------------------------------------------------------- Multi Wound Chart Details Patient Name: [name]. Date of Service: 07/06/2019 8:00 AM Medical Record Number: [id number] Patient Account Number: [id number] Date of Birth/Sex: [birth year] (74 y.o. F) Treating RN: Montey Hora Primary Care Amnah Breuer: Clarene Reamer Other Clinician: Referring Janeece Blok: Clarene Reamer Treating Toretto Tingler/Extender: Melburn Hake, HOYT Weeks in Treatment: 7 Vital Signs Height(in): 62 Pulse(bpm): 33 Weight(lbs): 380 Blood Pressure(mmHg): 146/60 Body Mass Index(BMI): 69 Temperature(F): 97.7 Respiratory Rate(breaths/min): 16 Photos: Wound Location: Left, Medial Lower Leg Left, Medial Ankle Left, Lateral Lower Leg Wounding Event: Gradually Appeared Gradually Appeared Gradually Appeared Primary Etiology: Lymphedema Venous Leg Ulcer Venous Leg Ulcer Comorbid History: Hypertension Hypertension  Hypertension Date Acquired: 08/24/2018 06/29/2019 06/24/2019 Weeks of Treatment: 7 1 1  Wound Status: Open Open Open Measurements L x W x D (cm) 1x0.4x0.1 1x1x0.1 0.1x0.1x0.1 Area (cm) : 0.314 0.785 0.008 Volume (cm) : 0.031 0.079 0.001 % Reduction in Area: 37.60% -150.00% 99.80% % Reduction in Volume: 79.50% -154.80% 99.70% Classification: Full Thickness Without Exposed Full Thickness Without Exposed Full Thickness Without Exposed Support Structures Support Structures Support Structures Exudate Amount: Medium Medium Medium Exudate Type: Serosanguineous Serosanguineous Serosanguineous Exudate Color: red, brown red, brown red, brown Wound Margin: Flat and Intact N/A N/A Granulation Amount: Medium (34-66%) Small (1-33%) Large (67-100%) Granulation Quality: Pink Pink Red Necrotic Amount: Small (1-33%) Large (67-100%) None Present (0%) Exposed Structures: Fat Layer (Subcutaneous Tissue) Fat Layer (Subcutaneous Tissue) Fat Layer (Subcutaneous Tissue) Exposed: Yes Exposed:  Yes Exposed: Yes Fascia: No Fascia: No Fascia: No Tendon: No Tendon: No Tendon: No Muscle: No Muscle: No Muscle: No Joint: No Joint: No Joint: No Bone: No Bone: No Bone: No Epithelialization: Small (1-33%) Medium (34-66%) None Treatment Notes Electronic Signature(s) Signed: 07/06/2019 4:09:31 PM By: Montey Hora Entered By: Montey Hora on 07/06/2019 08:26:36 [name], [name] Omara, [name] -------------------------------------------------------------------------------- Multi-Disciplinary Care Plan Details Patient Name: [name]. Date of Service: 07/06/2019 8:00 AM Medical Record Number: [id number] Patient Account Number: [id number] Date of Birth/Sex: 1945/06/03 (74 y.o. F) Treating RN: Montey Hora Primary Care Nyle Limb: Clarene Reamer Other Clinician: Referring Tevion Laforge: Clarene Reamer Treating Lovey Crupi/Extender: Melburn Hake, HOYT Weeks in Treatment: 7 Active Inactive Orientation to the Wound Care Program Nursing Diagnoses: Knowledge deficit related to the wound healing center program Goals: Patient/caregiver will verbalize understanding of the [place] Program Date Initiated: 05/17/2019 Target Resolution Date: 06/04/2019 Goal Status: Active Interventions: Provide education on orientation to the wound center Notes: Venous Leg Ulcer Nursing Diagnoses: Knowledge deficit related to disease process and management Goals: Patient will maintain optimal edema control Date Initiated: 06/08/2019 Target Resolution Date: 08/28/2019 Goal Status: Active Interventions: Compression as ordered Notes: Wound/Skin Impairment Nursing Diagnoses: Impaired tissue integrity Goals: Ulcer/skin breakdown will have a volume reduction of 30% by week 4 Date Initiated: 05/17/2019 Target Resolution Date: 06/05/2019 Goal Status: Active Interventions: Assess ulceration(s) every visit Notes: Electronic Signature(s) Signed: 07/06/2019  4:09:31 PM By: Montey Hora Entered By: Montey Hora on 07/06/2019 08:26:29 [name], [name] [place] [name], [name] -------------------------------------------------------------------------------- Pain Assessment Details Patient Name: [name]. Date of Service: 07/06/2019 8:00 AM Medical Record Number: [id number] Patient Account Number: [id number] Date of Birth/Sex: 06-Jul-1945 (74 y.o. F) Treating RN: Army Melia Primary Care Demarkis Gheen: Clarene Reamer Other Clinician: Referring Marlicia Sroka: Clarene Reamer Treating Jaelynn Pozo/Extender: Melburn Hake, HOYT Weeks in Treatment: 7 Active Problems Location of Pain Severity and Description of Pain Patient Has Paino No Site Locations Pain Management and Medication Current Pain Management: Electronic Signature(s) Signed: 07/06/2019 3:16:17 PM By: Army Melia Entered By: Army Melia on 07/06/2019 08:07:32 [name], [name] -------------------------------------------------------------------------------- Patient/Caregiver Education Details Patient Name: [name]. Date of Service: 07/06/2019 8:00 AM Medical Record Number: [id number] Patient Account Number: [id number] Date of Birth/Gender: February 09, 1946 (74 y.o. F) Treating RN: Montey Hora Primary Care Physician: Clarene Reamer Other Clinician: Referring Physician: Clarene Reamer Treating Physician/Extender: Sharalyn Ink in Treatment: 7 Education Assessment Education Provided To: Patient Education Topics Provided Venous: Handouts: Other: need for compression pumps Methods: Explain/Verbal Responses: State content correctly Electronic Signature(s) Signed: 07/06/2019 4:09:31 PM By: Montey Hora Entered By: Montey Hora on 07/06/2019 08:47:28 [name], Alyssa C. ([id number]) -------------------------------------------------------------------------------- Wound Assessment Details  Patient Name: MAELENE, BANKEY. Date of Service:  07/06/2019 8:00 AM Medical Record Number: [id number] Patient Account Number: [id number] Date of Birth/Sex: [birth year] (74 y.o. F) Treating RN: Army Melia Primary Care Hikeem Andersson: Clarene Reamer Other Clinician: Referring Kenric Ginger: Clarene Reamer Treating Kinza Gouveia/Extender: Melburn Hake, HOYT Weeks in Treatment: 7 Wound Status Wound Number: 1 Primary Etiology: Lymphedema Wound Location: Left, Medial Lower Leg Wound Status: Open Wounding Event: Gradually Appeared Comorbid History: Hypertension Date Acquired: 08/24/2018 Weeks Of Treatment: 7 Clustered Wound: No Photos Wound Measurements Length: (cm) 1 Width: (cm) 0.4 Depth: (cm) 0.1 Area: (cm) 0.314 Volume: (cm) 0.031 % Reduction in Area: 37.6% % Reduction in Volume: 79.5% Epithelialization: Small (1-33%) Wound Description Classification: Full Thickness Without Exposed Support Struct Wound Margin: Flat and Intact Exudate Amount: Medium Exudate Type: Serosanguineous Exudate Color: red, brown ures Foul Odor After Cleansing: No Slough/Fibrino Yes Wound Bed Granulation Amount: Medium (34-66%) Exposed Structure Granulation Quality: Pink Fascia Exposed: No Necrotic Amount: Small (1-33%) Fat Layer (Subcutaneous Tissue) Exposed: Yes Necrotic Quality: Adherent Slough Tendon Exposed: No Muscle Exposed: No Joint Exposed: No Bone Exposed: No Treatment Notes Wound #1 (Left, Medial Lower Leg) Notes prisma, Scientist, forensic Signature(s) Signed: 07/06/2019 3:16:17 PM By: Verl Dicker, [name] Entered By: Army Melia on 07/06/2019 08:13:30 [name], [name] -------------------------------------------------------------------------------- Wound Assessment Details Patient Name: Alyssa Reges C. Date of Service: 07/06/2019 8:00 AM Medical Record Number: [id number] Patient Account Number: [id number] Date of Birth/Sex: 08-24-1945 (74 y.o. F) Treating RN: Montey Hora Primary Care Behr Cislo:  Clarene Reamer Other Clinician: Referring Dashonna Chagnon: Clarene Reamer Treating Jamira Barfuss/Extender: Melburn Hake, HOYT Weeks in Treatment: 7 Wound Status Wound Number: 3 Primary Etiology: Venous Leg Ulcer Wound Location: Left, Medial Ankle Wound Status: Healed - Epithelialized Wounding Event: Gradually Appeared Comorbid History: Hypertension Date Acquired: 06/29/2019 Weeks Of Treatment: 1 Clustered Wound: No Photos Wound Measurements Length: (cm) Width: (cm) Depth: (cm) Area: (cm) Volume: (cm) 0 % Reduction in Area: 100% 0 % Reduction in Volume: 100% 0 Epithelialization: Medium (34-66%) 0 0 Wound Description Classification: Full Thickness Without Exposed Support Structu Exudate Amount: Medium Exudate Type: Serosanguineous Exudate Color: red, brown res Foul Odor After Cleansing: No Slough/Fibrino Yes Wound Bed Granulation Amount: Small (1-33%) Exposed Structure Granulation Quality: Pink Fascia Exposed: No Necrotic Amount: Large (67-100%) Fat Layer (Subcutaneous Tissue) Exposed: Yes Necrotic Quality: Adherent Slough Tendon Exposed: No Muscle Exposed: No Joint Exposed: No Bone Exposed: No Electronic Signature(s) Signed: 07/06/2019 4:09:31 PM By: Montey Hora Entered By: Montey Hora on 07/06/2019 08:28:42 [name], [name] -------------------------------------------------------------------------------- Wound Assessment Details Patient Name: Alyssa Reges C. Date of Service: 07/06/2019 8:00 AM Medical Record Number: [id number] Patient Account Number: [id number] Date of Birth/Sex: December 13, 1945 (74 y.o. F) Treating RN: Montey Hora Primary Care Lyda Colcord: Clarene Reamer Other Clinician: Referring Darilyn Storbeck: Clarene Reamer Treating Karsyn Rochin/Extender: Melburn Hake, HOYT Weeks in Treatment: 7 Wound Status Wound Number: 4 Primary Etiology: Venous Leg Ulcer Wound Location: Left, Lateral Lower Leg Wound Status: Healed - Epithelialized Wounding Event:  Gradually Appeared Comorbid History: Hypertension Date Acquired: 06/24/2019 Weeks Of Treatment: 1 Clustered Wound: No Photos Wound Measurements Length: (cm) Width: (cm) Depth: (cm) Area: (cm) Volume: (cm) 0 % Reduction in Area: 100% 0 % Reduction in Volume: 100% 0 Epithelialization: None 0 0 Wound Description Classification: Full Thickness Without Exposed Support Structures Exudate Amount: Medium Exudate Type: Serosanguineous Exudate Color: red, brown Foul Odor After Cleansing: No Slough/Fibrino No Wound Bed Granulation Amount: Large (67-100%) Exposed Structure Granulation Quality: Red Fascia Exposed: No Necrotic Amount: None  Present (0%) Fat Layer (Subcutaneous Tissue) Exposed: Yes Tendon Exposed: No Muscle Exposed: No Joint Exposed: No Bone Exposed: No Electronic Signature(s) Signed: 07/06/2019 4:09:31 PM By: Montey Hora Entered By: Montey Hora on 07/06/2019 08:28:42 Tyree, [name] ([id number]) -------------------------------------------------------------------------------- Vitals Details Patient Name: [name]. Date of Service: 07/06/2019 8:00 AM Medical Record Number: [id number] Patient Account Number: [id number] Date of Birth/Sex: [birth year] (74 y.o. F) Treating RN: Army Melia Primary Care Catlin Aycock: Clarene Reamer Other Clinician: Referring Jerilyn Gillaspie: Clarene Reamer Treating Fortunato Nordin/Extender: Melburn Hake, HOYT Weeks in Treatment: 7 Vital Signs Time Taken: 08:06 Temperature (F): 97.7 Height (in): 62 Pulse (bpm): 67 Weight (lbs): 380 Respiratory Rate (breaths/min): 16 Body Mass Index (BMI): 69.5 Blood Pressure (mmHg): 146/60 Reference Range: 80 - 120 mg / dl Electronic Signature(s) Signed: 07/06/2019 3:16:17 PM By: Army Melia Entered By: Army Melia on 07/06/2019 08:07:22

## 2019-07-09 ENCOUNTER — Other Ambulatory Visit: Payer: Self-pay

## 2019-07-09 DIAGNOSIS — L97822 Non-pressure chronic ulcer of other part of left lower leg with fat layer exposed: Secondary | ICD-10-CM | POA: Diagnosis not present

## 2019-07-09 DIAGNOSIS — I890 Lymphedema, not elsewhere classified: Secondary | ICD-10-CM | POA: Diagnosis not present

## 2019-07-09 DIAGNOSIS — Z6841 Body Mass Index (BMI) 40.0 and over, adult: Secondary | ICD-10-CM | POA: Diagnosis not present

## 2019-07-09 DIAGNOSIS — L97821 Non-pressure chronic ulcer of other part of left lower leg limited to breakdown of skin: Secondary | ICD-10-CM | POA: Diagnosis not present

## 2019-07-09 DIAGNOSIS — I10 Essential (primary) hypertension: Secondary | ICD-10-CM | POA: Diagnosis not present

## 2019-07-09 DIAGNOSIS — L97322 Non-pressure chronic ulcer of left ankle with fat layer exposed: Secondary | ICD-10-CM | POA: Diagnosis not present

## 2019-07-09 NOTE — Progress Notes (Signed)
KATELEIGH, GALLIA ([id number]) Visit Report for 07/09/2019 Arrival Information Details Patient Name: Alyssa, [name]. Date of Service: 07/09/2019 8:00 AM Medical Record Number: [id number] Patient Account Number: [id number] Date of Birth/Sex: [birth year] (74 y.o. F) Treating RN: Montey Hora Primary Care Nuh Lipton: Clarene Reamer Other Clinician: Referring Cinnamon Morency: Clarene Reamer Treating Dwayne Bulkley/Extender: Melburn Hake, HOYT Weeks in Treatment: 7 Visit Information History Since Last Visit Added or deleted any medications: No Patient Arrived: Ambulatory Any new allergies or adverse reactions: No Arrival Time: 08:06 Had a fall or experienced change in No Transfer Assistance: None activities of daily living that may affect Patient Identification Verified: Yes risk of falls: Secondary Verification Process Completed: Yes Signs or symptoms of abuse/neglect since last visito No Patient Requires Transmission-Based Precautions: No Hospitalized since last visit: No Patient Has Alerts: No Implantable device outside of the clinic excluding No cellular tissue based products placed in the center since last visit: Has Dressing in Place as Prescribed: Yes Has Compression in Place as Prescribed: Yes Pain Present Now: No Notes 98.0 Electronic Signature(s) Signed: 07/09/2019 4:33:37 PM By: Montey Hora Entered By: Montey Hora on 07/09/2019 08:07:35 [name] -------------------------------------------------------------------------------- Encounter Discharge Information Details Patient Name: [name]. Date of Service: 07/09/2019 8:00 AM Medical Record Number: [id number] Patient Account Number: [id number] Date of Birth/Sex: 27-Feb-1946 (74 y.o. F) Treating RN: Montey Hora Primary Care Asucena Galer: Clarene Reamer Other Clinician: Referring Lamarkus Nebel: Clarene Reamer Treating Abdoulaye Drum/Extender: Sharalyn Ink in Treatment: 7 Encounter Discharge  Information Items Discharge Condition: Stable Ambulatory Status: Ambulatory Discharge Destination: Home Transportation: Private Auto Schedule Follow-up Appointment: Yes Clinical Summary of Care: Electronic Signature(s) Signed: 07/09/2019 4:33:37 PM By: Montey Hora Entered By: Montey Hora on 07/09/2019 08:26:00 [name] -------------------------------------------------------------------------------- Wound Assessment Details Patient Name: Alyssa Reges C. Date of Service: 07/09/2019 8:00 AM Medical Record Number: [id number] Patient Account Number: [id number] Date of Birth/Sex: 11/16/1945 (74 y.o. F) Treating RN: Montey Hora Primary Care Jeffifer Rabold: Clarene Reamer Other Clinician: Referring Noal Abshier: Clarene Reamer Treating Junie Engram/Extender: Melburn Hake, HOYT Weeks in Treatment: 7 Wound Status Wound Number: 1 Primary Etiology: Lymphedema Wound Location: Left, Medial Lower Leg Wound Status: Open Wounding Event: Gradually Appeared Comorbid History: Hypertension Date Acquired: 08/24/2018 Weeks Of Treatment: 7 Clustered Wound: No Photos Wound Measurements Length: (cm) 1.3 Width: (cm) 0.3 Depth: (cm) 0.1 Area: (cm) 0.306 Volume: (cm) 0.031 % Reduction in Area: 39.2% % Reduction in Volume: 79.5% Epithelialization: Medium (34-66%) Tunneling: No Undermining: No Wound Description Classification: Full Thickness Without Exposed Support Structure Wound Margin: Flat and Intact Exudate Amount: None Present s Foul Odor After Cleansing: No Slough/Fibrino Yes Wound Bed Granulation Amount: Small (1-33%) Exposed Structure Granulation Quality: Pink Fascia Exposed: No Necrotic Amount: Small (1-33%) Fat Layer (Subcutaneous Tissue) Exposed: Yes Necrotic Quality: Adherent Slough Tendon Exposed: No Muscle Exposed: No Joint Exposed: No Bone Exposed: No Treatment Notes Wound #1 (Left, Medial Lower Leg) 1. Cleansed with: Cleanse wound with antibacterial  soap and water 3. Peri-wound Care: Moisturizing lotion 5. Secondary Dressing Applied ABD Pad 7. Secured with [employer] to Left Lower Extremity [name], [name] Tubigrip Electronic Signature(s) Signed: 07/09/2019 4:33:37 PM By: Montey Hora Entered By: Montey Hora on 07/09/2019 08:13:25

## 2019-07-12 ENCOUNTER — Ambulatory Visit: Payer: Medicare Other

## 2019-07-13 ENCOUNTER — Ambulatory Visit: Payer: Medicare Other

## 2019-07-13 ENCOUNTER — Encounter: Payer: Medicare Other | Admitting: Physician Assistant

## 2019-07-13 ENCOUNTER — Other Ambulatory Visit: Payer: Self-pay

## 2019-07-13 DIAGNOSIS — L97822 Non-pressure chronic ulcer of other part of left lower leg with fat layer exposed: Secondary | ICD-10-CM | POA: Diagnosis not present

## 2019-07-13 DIAGNOSIS — I890 Lymphedema, not elsewhere classified: Secondary | ICD-10-CM | POA: Diagnosis not present

## 2019-07-13 DIAGNOSIS — L97821 Non-pressure chronic ulcer of other part of left lower leg limited to breakdown of skin: Secondary | ICD-10-CM | POA: Diagnosis not present

## 2019-07-13 DIAGNOSIS — I10 Essential (primary) hypertension: Secondary | ICD-10-CM | POA: Diagnosis not present

## 2019-07-13 DIAGNOSIS — L97322 Non-pressure chronic ulcer of left ankle with fat layer exposed: Secondary | ICD-10-CM | POA: Diagnosis not present

## 2019-07-13 DIAGNOSIS — Z6841 Body Mass Index (BMI) 40.0 and over, adult: Secondary | ICD-10-CM | POA: Diagnosis not present

## 2019-07-13 NOTE — Progress Notes (Signed)
[name], [name] Visit Report for 07/13/2019 Arrival Information Details Patient Name: [name], [name]. Date of Service: 07/13/2019 8:00 AM Medical Record Number: [id number] Patient Account Number: [id number] Date of Birth/Sex: 1946-02-13 (74 y.o. F) Treating RN: Army Melia Primary Care Leemon Ayala: Clarene Reamer Other Clinician: Referring Damarie Schoolfield: Clarene Reamer Treating Eathen Budreau/Extender: Melburn Hake, HOYT Weeks in Treatment: 8 Visit Information History Since Last Visit Added or deleted any medications: No Patient Arrived: Ambulatory Any new allergies or adverse reactions: No Arrival Time: 08:21 Had a fall or experienced change in No Accompanied By: self activities of daily living that may affect Transfer Assistance: None risk of falls: Patient Identification Verified: Yes Signs or symptoms of abuse/neglect since last visito No Patient Requires Transmission-Based Precautions: No Hospitalized since last visit: No Patient Has Alerts: No Has Dressing in Place as Prescribed: Yes Pain Present Now: No Electronic Signature(s) Signed: 07/13/2019 10:03:18 AM By: Army Melia Entered By: Army Melia on 07/13/2019 08:21:43 [name], [name] -------------------------------------------------------------------------------- Compression Therapy Details Patient Name: [name]. Date of Service: 07/13/2019 8:00 AM Medical Record Number: [id number] Patient Account Number: [id number] Date of Birth/Sex: 02/07/1946 (74 y.o. F) Treating RN: Army Melia Primary Care Christle Nolting: Clarene Reamer Other Clinician: Referring Alis Sawchuk: Clarene Reamer Treating Keyari Kleeman/Extender: Melburn Hake, HOYT Weeks in Treatment: 8 Compression Therapy Performed for Wound Assessment: Wound #1 Left,Medial Lower Leg Performed By: Clinician Army Melia, RN Compression Type: Rolena Infante Electronic Signature(s) Signed: 07/13/2019 10:03:18 AM By: Army Melia Entered By: Army Melia on  07/13/2019 08:22:01 [name], [name] -------------------------------------------------------------------------------- Encounter Discharge Information Details Patient Name: [name]. Date of Service: 07/13/2019 8:00 AM Medical Record Number: [id number] Patient Account Number: [id number] Date of Birth/Sex: 1945/06/12 (74 y.o. F) Treating RN: Army Melia Primary Care Latangela Mccomas: Clarene Reamer Other Clinician: Referring Taevyn Hausen: Clarene Reamer Treating Finley Dinkel/Extender: Melburn Hake, HOYT Weeks in Treatment: 8 Encounter Discharge Information Items Discharge Condition: Stable Ambulatory Status: Ambulatory Discharge Destination: Home Transportation: Private Auto Accompanied By: self Schedule Follow-up Appointment: Yes Clinical Summary of Care: Electronic Signature(s) Signed: 07/13/2019 10:03:18 AM By: Army Melia Entered By: Army Melia on 07/13/2019 08:23:06 [name], [name] -------------------------------------------------------------------------------- Wound Assessment Details Patient Name: [name]. Date of Service: 07/13/2019 8:00 AM Medical Record Number: [id number] Patient Account Number: [id number] Date of Birth/Sex: 08-28-1945 (74 y.o. F) Treating RN: Army Melia Primary Care Dashaun Onstott: Clarene Reamer Other Clinician: Referring Henrique Parekh: Clarene Reamer Treating Ivalee Strauser/Extender: Melburn Hake, HOYT Weeks in Treatment: 8 Wound Status Wound Number: 1 Primary Etiology: Lymphedema Wound Location: Left, Medial Lower Leg Wound Status: Open Wounding Event: Gradually Appeared Date Acquired: 08/24/2018 Weeks Of Treatment: 8 Clustered Wound: No Wound Measurements Length: (cm) 1.3 Width: (cm) 0.3 Depth: (cm) 0.1 Area: (cm) 0.306 Volume: (cm) 0.031 % Reduction in Area: 39.2% % Reduction in Volume: 79.5% Wound Description Classification: Full Thickness Without Exposed Support Structure s Treatment Notes Wound #1 (Left, Medial  Lower Leg) 1. Cleansed with: Cleanse wound with antibacterial soap and water 3. Peri-wound Care: Moisturizing lotion Other peri-wound care (specify in notes) 5. Secondary Dressing Applied ABD Pad 7. Secured with [employer] to Left Lower Extremity Tubigrip Notes TCA, prisma, unna boot, tubi Electronic Signature(s) Signed: 07/13/2019 10:03:18 AM By: Army Melia Entered By: Army Melia on 07/13/2019 08:21:49

## 2019-07-16 ENCOUNTER — Other Ambulatory Visit: Payer: Self-pay

## 2019-07-16 DIAGNOSIS — I890 Lymphedema, not elsewhere classified: Secondary | ICD-10-CM | POA: Diagnosis not present

## 2019-07-16 DIAGNOSIS — I10 Essential (primary) hypertension: Secondary | ICD-10-CM | POA: Diagnosis not present

## 2019-07-16 DIAGNOSIS — L97322 Non-pressure chronic ulcer of left ankle with fat layer exposed: Secondary | ICD-10-CM | POA: Diagnosis not present

## 2019-07-16 DIAGNOSIS — Z6841 Body Mass Index (BMI) 40.0 and over, adult: Secondary | ICD-10-CM | POA: Diagnosis not present

## 2019-07-16 DIAGNOSIS — L97822 Non-pressure chronic ulcer of other part of left lower leg with fat layer exposed: Secondary | ICD-10-CM | POA: Diagnosis not present

## 2019-07-16 DIAGNOSIS — L97821 Non-pressure chronic ulcer of other part of left lower leg limited to breakdown of skin: Secondary | ICD-10-CM | POA: Diagnosis not present

## 2019-07-16 NOTE — Progress Notes (Signed)
VENIS, BANKE ([id number]) Visit Report for 07/16/2019 Arrival Information Details Patient Name: [name], [name]. Date of Service: 07/16/2019 8:00 AM Medical Record Number: [id number] Patient Account Number: [id number] Date of Birth/Sex: 1945/10/19 (74 y.o. F) Treating RN: Army Melia Primary Care Robley Matassa: Clarene Reamer Other Clinician: Referring Patrecia Veiga: Clarene Reamer Treating Trinadee Verhagen/Extender: Melburn Hake, HOYT Weeks in Treatment: 8 Visit Information History Since Last Visit Added or deleted any medications: No Patient Arrived: Ambulatory Any new allergies or adverse reactions: No Arrival Time: 08:23 Had a fall or experienced change in No Accompanied By: self activities of daily living that may affect Transfer Assistance: None risk of falls: Patient Requires Transmission-Based Precautions: No Signs or symptoms of abuse/neglect since last visito No Patient Has Alerts: No Hospitalized since last visit: No Has Dressing in Place as Prescribed: Yes Pain Present Now: No Electronic Signature(s) Signed: 07/16/2019 11:45:23 AM By: Army Melia Entered By: Army Melia on 07/16/2019 08:23:21 Schaff, [name] ([id number]) -------------------------------------------------------------------------------- Compression Therapy Details Patient Name: [name]. Date of Service: 07/16/2019 8:00 AM Medical Record Number: [id number] Patient Account Number: [id number] Date of Birth/Sex: 02/18/1946 (74 y.o. F) Treating RN: Army Melia Primary Care Shakema Surita: Clarene Reamer Other Clinician: Referring Jenniffer Vessels: Clarene Reamer Treating Darianne Muralles/Extender: Melburn Hake, HOYT Weeks in Treatment: 8 Compression Therapy Performed for Wound Assessment: Wound #1 Left,Medial Lower Leg Performed By: Clinician Army Melia, RN Compression Type: Rolena Infante Electronic Signature(s) Signed: 07/16/2019 11:45:23 AM By: Army Melia Entered By: Army Melia on 07/16/2019 08:24:08 Boody, [name] -------------------------------------------------------------------------------- Encounter Discharge Information Details Patient Name: [name]. Date of Service: 07/16/2019 8:00 AM Medical Record Number: [id number] Patient Account Number: [id number] Date of Birth/Sex: April 25, 1945 (74 y.o. F) Treating RN: Army Melia Primary Care Morna Flud: Clarene Reamer Other Clinician: Referring Billye Pickerel: Clarene Reamer Treating Alpa Salvo/Extender: Melburn Hake, HOYT Weeks in Treatment: 8 Encounter Discharge Information Items Discharge Condition: Stable Ambulatory Status: Ambulatory Discharge Destination: Home Transportation: Private Auto Accompanied By: self Schedule Follow-up Appointment: Yes Clinical Summary of Care: Electronic Signature(s) Signed: 07/16/2019 11:45:23 AM By: Army Melia Entered By: Army Melia on 07/16/2019 08:24:32 Yinger, [name] ([id number]) -------------------------------------------------------------------------------- Wound Assessment Details Patient Name: [name]. Date of Service: 07/16/2019 8:00 AM Medical Record Number: [id number] Patient Account Number: [id number] Date of Birth/Sex: 19-Sep-1945 (74 y.o. F) Treating RN: Army Melia Primary Care Khole Arterburn: Clarene Reamer Other Clinician: Referring Marcelline Temkin: Clarene Reamer Treating Trinitee Horgan/Extender: Melburn Hake, HOYT Weeks in Treatment: 8 Wound Status Wound Number: 1 Primary Etiology: Lymphedema Wound Location: Left, Medial Lower Leg Wound Status: Open Wounding Event: Gradually Appeared Comorbid History: Hypertension Date Acquired: 08/24/2018 Weeks Of Treatment: 8 Clustered Wound: No Photos Wound Measurements Length: (cm) 0.1 Width: (cm) 0.1 Depth: (cm) 0.1 Area: (cm) 0.008 Volume: (cm) 0.001 % Reduction in Area: 98.4% % Reduction in Volume: 99.3% Epithelialization: Medium (34-66%) Tunneling: No Undermining: No Wound Description Classification: Full Thickness Without  Exposed Support Structu Exudate Amount: Medium Exudate Type: Serosanguineous Exudate Color: red, brown res Foul Odor After Cleansing: No Slough/Fibrino Yes Wound Bed Granulation Amount: Small (1-33%) Exposed Structure Granulation Quality: Red Fascia Exposed: No Necrotic Amount: Large (67-100%) Fat Layer (Subcutaneous Tissue) Exposed: Yes Necrotic Quality: Eschar Tendon Exposed: No Muscle Exposed: No Joint Exposed: No Bone Exposed: No Treatment Notes Wound #1 (Left, Medial Lower Leg) Notes TCA, prisma, unna boot, tubi Electronic Signature(s) Signed: 07/16/2019 11:45:23 AM By: Verl Dicker, [name] ([id number]) Entered By: Army Melia on 07/16/2019 08:23:54

## 2019-07-20 ENCOUNTER — Encounter: Payer: Medicare Other | Admitting: Physician Assistant

## 2019-07-20 ENCOUNTER — Other Ambulatory Visit: Payer: Self-pay

## 2019-07-20 DIAGNOSIS — L97822 Non-pressure chronic ulcer of other part of left lower leg with fat layer exposed: Secondary | ICD-10-CM | POA: Diagnosis not present

## 2019-07-20 DIAGNOSIS — I890 Lymphedema, not elsewhere classified: Secondary | ICD-10-CM | POA: Diagnosis not present

## 2019-07-20 DIAGNOSIS — L97821 Non-pressure chronic ulcer of other part of left lower leg limited to breakdown of skin: Secondary | ICD-10-CM | POA: Diagnosis not present

## 2019-07-20 DIAGNOSIS — L97322 Non-pressure chronic ulcer of left ankle with fat layer exposed: Secondary | ICD-10-CM | POA: Diagnosis not present

## 2019-07-20 DIAGNOSIS — I10 Essential (primary) hypertension: Secondary | ICD-10-CM | POA: Diagnosis not present

## 2019-07-20 DIAGNOSIS — Z6841 Body Mass Index (BMI) 40.0 and over, adult: Secondary | ICD-10-CM | POA: Diagnosis not present

## 2019-07-20 NOTE — Progress Notes (Addendum)
Alyssa, [name] ([id number]) Visit Report for 07/20/2019 Chief Complaint Document Details Patient Name: Alyssa, [name]. Date of Service: 07/20/2019 8:00 AM Medical Record Number: [id number] Patient Account Number: [id number] Date of Birth/Sex: [birth year] (74 y.o. F) Treating RN: Montey Hora Primary Care Provider: Clarene Reamer Other Clinician: Referring Provider: Clarene Reamer Treating Provider/Extender: Melburn Hake, Memori Sammon Weeks in Treatment: 9 Information Obtained from: Patient Chief Complaint Left LE and ankle ulcers Electronic Signature(s) Signed: 07/20/2019 8:23:04 AM By: Worthy Keeler PA-C Entered By: Worthy Keeler on 07/20/2019 08:23:04 [name], [name] ([id number]) -------------------------------------------------------------------------------- HPI Details Patient Name: [name]. Date of Service: 07/20/2019 8:00 AM Medical Record Number: [id number] Patient Account Number: [id number] Date of Birth/Sex: 06-24-1945 (74 y.o. F) Treating RN: Montey Hora Primary Care Provider: Clarene Reamer Other Clinician: Referring Provider: Clarene Reamer Treating Provider/Extender: Melburn Hake, Kynzlee Hucker Weeks in Treatment: 9 History of Present Illness HPI Description: 05/17/2019 upon evaluation today patient presents today for initial evaluation here in the clinic regarding her left lower extremity open wound secondary to lymphedema. She has previously been seen at the [facility] clinic 1 time in July 2020 but did not have any follow-up after that point. Subsequently she has not been seen here in [place] prior to today. Currently she tells me that she has been having issues for the past 1-2 months with wounds over the left lower extremity 2 that are more distinct she did has a breakdown of an area which is much more superficial as well on her leg but in general she mainly is dealing with what I would term stage III lymphedema. Fortunately there is no signs of active  cellulitis she does have erythema but I think this is more stasis dermatitis than anything infectious in nature. Fortunately there is no evidence of systemic infection which is also good news. She does have a history of lymphedema though she is never worn compression stockings she tells me. She does have hypertension as well. 05/25/2019 upon evaluation today patient appears to be doing about the same with regard to her wounds upon inspection today. Her edema is somewhat better but still with the larger calf muscle area and smaller area around her ankle it is very difficult to wrap her appropriately without the sliding down. Patient really did not feel like it slid down too much nonetheless it did appear to slide quite a bit based on what we were seeing today. Last Thursday was the same. With that being said she still does need compression we did have a discussion between several of the nurses and myself today with the patient about what the best thing to do may be. 06/01/2019 upon evaluation today patient appears to be doing slightly better in regard to her leg at this point. There are still a lot of irritation but she does not seem to be having as much as much issue with the weeping as compared to last time still we are not really doing a whole lot better as of yet in general. 06/08/2019 upon evaluation today patient actually seems to be making excellent progress in regard to her wounds and her leg in general. The weeping has dramatically improved and overall very pleased with things appear today. There is no signs of active infection which is good news. No fevers, chills, nausea, vomiting, or diarrhea. 06/15/2019 upon evaluation today patient appears to be doing excellent in regard to her left lower extremity. The wounds are measuring smaller and overall things are doing well despite the fact that  again we have had a difficult time getting the appropriate wrap on her. Nonetheless I do believe the Unna  boot on the bottom part with utilizing the Tubigrip at the top has done fairly well and again she seems to be making good progress. There is no signs of active infection at this time. 06/22/2019 upon evaluation today patient appears to be doing better with regard to her wounds on the left lower extremity. She has been tolerating the dressing changes without complication. Fortunately there is no signs of active infection at this time. No fevers, chills, nausea, vomiting, or diarrhea. 06/29/2019 upon evaluation today patient actually appears to be doing better in regard to the overall appearance of her leg ulcer. She has been tolerating the dressing changes without complication. Fortunately there is no signs of active infection at this time. No fevers, chills, nausea, vomiting, or diarrhea. Overall very pleased with how things seem to be progressing. 07/06/2019 upon evaluation today patient appears to be doing excellent with regard to her left lower extremity. In fact 2 of the wound areas we have last week of completely sealed up and the 1 remaining is still showing signs of being open although this is extremely small. Overall the patient is doing well she also received her pumps last week. Today she has somebody coming out to show her how to use these. 07/20/2019 on evaluation today patient appears to be doing excellent with regard to her wound in fact this appears to be completely healed based on what I am seeing at this point. Fortunately there is no signs of active infection which is also excellent news. No fevers, chills, nausea, vomiting, or diarrhea. Electronic Signature(s) Signed: 07/20/2019 9:07:46 AM By: Worthy Keeler PA-C Entered By: Worthy Keeler on 07/20/2019 09:07:46 [name], [name] ([id number]) -------------------------------------------------------------------------------- Physical Exam Details Patient Name: [name]. Date of Service: 07/20/2019 8:00 AM Medical Record  Number: [id number] Patient Account Number: [id number] Date of Birth/Sex: 1945/06/11 (74 y.o. F) Treating RN: Montey Hora Primary Care Provider: Clarene Reamer Other Clinician: Referring Provider: Clarene Reamer Treating Provider/Extender: Melburn Hake, Rayley Gao Weeks in Treatment: 9 Constitutional Well-nourished and well-hydrated in no acute distress. Respiratory normal breathing without difficulty. Psychiatric this patient is able to make decisions and demonstrates good insight into disease process. Alert and Oriented x 3. pleasant and cooperative. Notes Patient's wound showed signs of complete epithelization and appears to be doing excellent her leg and skin in general appear to be doing great as well. Electronic Signature(s) Signed: 07/20/2019 9:08:03 AM By: Worthy Keeler PA-C Entered By: Worthy Keeler on 07/20/2019 09:08:03 DAWNI, SCHWERY ([id number]) -------------------------------------------------------------------------------- Physician Orders Details Patient Name: [name] Date of Service: 07/20/2019 8:00 AM Medical Record Number: [id number] Patient Account Number: [id number] Date of Birth/Sex: 1945-04-20 (74 y.o. F) Treating RN: Montey Hora Primary Care Provider: Clarene Reamer Other Clinician: Referring Provider: Clarene Reamer Treating Provider/Extender: Melburn Hake, Edelin Fryer Weeks in Treatment: 9 Verbal / Phone Orders: No Diagnosis Coding ICD-10 Coding Code Description I89.0 Lymphedema, not elsewhere classified L97.822 Non-pressure chronic ulcer of other part of left lower leg with fat layer exposed L97.322 Non-pressure chronic ulcer of left ankle with fat layer exposed L97.821 Non-pressure chronic ulcer of other part of left lower leg limited to breakdown of skin I10 Essential (primary) hypertension Discharge From [facility] Services o Discharge from [place] - Please get measured asap for your custom compression. Electronic Signature(s) Signed:  07/20/2019 4:58:50 PM By: Montey Hora Signed: 07/20/2019 6:46:24 PM By:  Melburn Hake, Iam Lipson PA-C Entered By: Montey Hora on 07/20/2019 [id number] [name] -------------------------------------------------------------------------------- Problem List Details Patient Name: LAWONDA, LUBERTO. Date of Service: 07/20/2019 8:00 AM Medical Record Number: [id number] Patient Account Number: [id number] Date of Birth/Sex: 10/23/1945 (74 y.o. F) Treating RN: Montey Hora Primary Care Provider: Clarene Reamer Other Clinician: Referring Provider: Clarene Reamer Treating Provider/Extender: Melburn Hake, Caira Poche Weeks in Treatment: 9 Active Problems ICD-10 Encounter Code Description Active Date MDM Diagnosis I89.0 Lymphedema, not elsewhere classified 05/17/2019 No Yes L97.822 Non-pressure chronic ulcer of other part of left lower leg with fat layer 05/17/2019 No Yes exposed L97.322 Non-pressure chronic ulcer of left ankle with fat layer exposed 05/17/2019 No Yes L97.821 Non-pressure chronic ulcer of other part of left lower leg limited to 05/17/2019 No Yes breakdown of skin I10 Essential (primary) hypertension 05/17/2019 No Yes Inactive Problems Resolved Problems Electronic Signature(s) Signed: 07/20/2019 8:22:58 AM By: Worthy Keeler PA-C Entered By: Worthy Keeler on 07/20/2019 08:22:58 Stanislawski, [name] ([id number]) -------------------------------------------------------------------------------- Progress Note Details Patient Name: [name]. Date of Service: 07/20/2019 8:00 AM Medical Record Number: [id number] Patient Account Number: [id number] Date of Birth/Sex: [birth year] (74 y.o. F) Treating RN: Montey Hora Primary Care Provider: Clarene Reamer Other Clinician: Referring Provider: Clarene Reamer Treating Provider/Extender: Melburn Hake, Ariyana Faw Weeks in Treatment: 9 Subjective Chief Complaint Information obtained from Patient Left LE and ankle ulcers History of  Present Illness (HPI) 05/17/2019 upon evaluation today patient presents today for initial evaluation here in the clinic regarding her left lower extremity open wound secondary to lymphedema. She has previously been seen at the [facility] clinic 1 time in July 2020 but did not have any follow-up after that point. Subsequently she has not been seen here in [place] prior to today. Currently she tells me that she has been having issues for the past 1-2 months with wounds over the left lower extremity 2 that are more distinct she did has a breakdown of an area which is much more superficial as well on her leg but in general she mainly is dealing with what I would term stage III lymphedema. Fortunately there is no signs of active cellulitis she does have erythema but I think this is more stasis dermatitis than anything infectious in nature. Fortunately there is no evidence of systemic infection which is also good news. She does have a history of lymphedema though she is never worn compression stockings she tells me. She does have hypertension as well. 05/25/2019 upon evaluation today patient appears to be doing about the same with regard to her wounds upon inspection today. Her edema is somewhat better but still with the larger calf muscle area and smaller area around her ankle it is very difficult to wrap her appropriately without the sliding down. Patient really did not feel like it slid down too much nonetheless it did appear to slide quite a bit based on what we were seeing today. Last Thursday was the same. With that being said she still does need compression we did have a discussion between several of the nurses and myself today with the patient about what the best thing to do may be. 06/01/2019 upon evaluation today patient appears to be doing slightly better in regard to her leg at this point. There are still a lot of irritation but she does not seem to be having as much as much issue with the weeping  as compared to last time still we are not really doing a whole lot better as  of yet in general. 06/08/2019 upon evaluation today patient actually seems to be making excellent progress in regard to her wounds and her leg in general. The weeping has dramatically improved and overall very pleased with things appear today. There is no signs of active infection which is good news. No fevers, chills, nausea, vomiting, or diarrhea. 06/15/2019 upon evaluation today patient appears to be doing excellent in regard to her left lower extremity. The wounds are measuring smaller and overall things are doing well despite the fact that again we have had a difficult time getting the appropriate wrap on her. Nonetheless I do believe the Unna boot on the bottom part with utilizing the Tubigrip at the top has done fairly well and again she seems to be making good progress. There is no signs of active infection at this time. 06/22/2019 upon evaluation today patient appears to be doing better with regard to her wounds on the left lower extremity. She has been tolerating the dressing changes without complication. Fortunately there is no signs of active infection at this time. No fevers, chills, nausea, vomiting, or diarrhea. 06/29/2019 upon evaluation today patient actually appears to be doing better in regard to the overall appearance of her leg ulcer. She has been tolerating the dressing changes without complication. Fortunately there is no signs of active infection at this time. No fevers, chills, nausea, vomiting, or diarrhea. Overall very pleased with how things seem to be progressing. 07/06/2019 upon evaluation today patient appears to be doing excellent with regard to her left lower extremity. In fact 2 of the wound areas we have last week of completely sealed up and the 1 remaining is still showing signs of being open although this is extremely small. Overall the patient is doing well she also received her pumps last  week. Today she has somebody coming out to show her how to use these. 07/20/2019 on evaluation today patient appears to be doing excellent with regard to her wound in fact this appears to be completely healed based on what I am seeing at this point. Fortunately there is no signs of active infection which is also excellent news. No fevers, chills, nausea, vomiting, or diarrhea. Objective Constitutional Well-nourished and well-hydrated in no acute distress. Vitals Time Taken: 8:08 AM, Height: 62 in, Weight: 380 lbs, BMI: 69.5, Temperature: 97.7 F, Pulse: 68 bpm, Respiratory Rate: 16 breaths/min, Neth, Tonimarie C. ([id number]) Blood Pressure: 147/57 mmHg. Respiratory normal breathing without difficulty. Psychiatric this patient is able to make decisions and demonstrates good insight into disease process. Alert and Oriented x 3. pleasant and cooperative. General Notes: Patient's wound showed signs of complete epithelization and appears to be doing excellent her leg and skin in general appear to be doing great as well. Integumentary (Hair, Skin) Wound #1 status is Healed - Epithelialized. Original cause of wound was Gradually Appeared. The wound is located on the Left,Medial Lower Leg. The wound measures 0cm length x 0cm width x 0cm depth; 0cm^2 area and 0cm^3 volume. There is Fat Layer (Subcutaneous Tissue) Exposed exposed. There is a medium amount of serosanguineous drainage noted. There is medium (34-66%) red granulation within the wound bed. There is a medium (34-66%) amount of necrotic tissue within the wound bed including Adherent Slough. 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 1. Patient appears to be doing excellent and in fact is completely healed. I think that she is now ready to discharge and to go see the medical supply store where she is can be measured for custom compression stockings. 2. I also think that if she has any concerns or issues as far as new wounds opening she should contact the office and let me know we will be happy to see her back as needed. Follow-up as needed. Electronic Signature(s) Signed: 07/20/2019 6:39:36 PM By: Worthy Keeler PA-C Entered By: Worthy Keeler on 07/20/2019 18:39:36 Hollett, [name] ([id number]) -------------------------------------------------------------------------------- SuperBill Details Patient Name: [name] Date of Service: 07/20/2019 Medical Record Number: [id number] Patient Account Number: [id number] Date of Birth/Sex: 1945/04/22 (74 y.o. F) Treating RN: Montey Hora Primary Care Provider: Clarene Reamer Other Clinician: Referring Provider: Clarene Reamer Treating Provider/Extender: Melburn Hake, Armstrong Creasy Weeks in Treatment: 9 Diagnosis Coding ICD-10 Codes Code Description I89.0 Lymphedema, not elsewhere classified L97.822 Non-pressure chronic ulcer of other part of left lower leg with fat layer exposed L97.322 Non-pressure chronic ulcer of left ankle with fat layer exposed L97.821 Non-pressure chronic ulcer of other part of left lower leg limited to breakdown of skin I10 Essential (primary) hypertension Facility Procedures The patient participates with Medicare or their insurance follows the Medicare Facility Guidelines: CPT4 Code Description Modifier Quantity [id number] Meta VISIT-LEV 3 EST PT 1 Physician Procedures CPT4 Code: [id number] Description: [id number] - WC PHYS LEVEL 3 - EST PT Modifier: Quantity: 1 CPT4 Code: Description: ICD-10 Diagnosis Description I89.0 Lymphedema,  not elsewhere classified L97.822 Non-pressure chronic ulcer of other part of left lower leg with fat laye L97.322 Non-pressure chronic ulcer of left ankle with fat layer exposed L97.821  Non-pressure chronic ulcer of other part of left lower leg limited to br Modifier: r exposed eakdown of skin Quantity: Electronic Signature(s) Signed: 07/20/2019 6:39:59 PM By: Worthy Keeler PA-C Entered By: Worthy Keeler on 07/20/2019 18:39:59

## 2019-07-21 NOTE — Progress Notes (Addendum)
[name] ([id number]) Visit Report for 07/20/2019 Arrival Information Details Patient Name: [name]. Date of Service: 07/20/2019 8:00 AM Medical Record Number: [id number] Patient Account Number: [id number] Date of Birth/Sex: 16-May-1945 (74 y.o. F) Treating RN: Army Melia Primary Care Fauna Neuner: Clarene Reamer Other Clinician: Referring Terrika Zuver: Clarene Reamer Treating Moriyah Byington/Extender: Melburn Hake, HOYT Weeks in Treatment: 9 Visit Information History Since Last Visit Added or deleted any medications: No Patient Arrived: Ambulatory Any new allergies or adverse reactions: No Arrival Time: 08:07 Had a fall or experienced change in No Accompanied By: self activities of daily living that may affect Transfer Assistance: None risk of falls: Patient Identification Verified: Yes Signs or symptoms of abuse/neglect since last visito No Patient Requires Transmission-Based Precautions: No Hospitalized since last visit: No Patient Has Alerts: No Has Dressing in Place as Prescribed: Yes Pain Present Now: No Electronic Signature(s) Signed: 07/20/2019 2:43:41 PM By: Army Melia Entered By: Army Melia on 07/20/2019 08:08:06 [name], [name] ([id number]) -------------------------------------------------------------------------------- Clinic Level of Care Assessment Details Patient Name: [name]. Date of Service: 07/20/2019 8:00 AM Medical Record Number: [id number] Patient Account Number: [id number] Date of Birth/Sex: 15-Jul-1945 (74 y.o. F) Treating RN: Montey Hora Primary Care Griff Badley: Clarene Reamer Other Clinician: Referring Kalib Bhagat: Clarene Reamer Treating Halli Equihua/Extender: Melburn Hake, HOYT Weeks in Treatment: 9 Clinic Level of Care Assessment Items TOOL 4 Quantity Score []  - Use when only an EandM is performed on FOLLOW-UP visit 0 ASSESSMENTS - Nursing Assessment / Reassessment X - Reassessment of Co-morbidities (includes updates in patient status)  1 10 X- 1 5 Reassessment of Adherence to Treatment Plan ASSESSMENTS - Wound and Skin Assessment / Reassessment X - Simple Wound Assessment / Reassessment - one wound 1 5 []  - 0 Complex Wound Assessment / Reassessment - multiple wounds []  - 0 Dermatologic / Skin Assessment (not related to wound area) ASSESSMENTS - Focused Assessment X - Circumferential Edema Measurements - multi extremities 1 5 []  - 0 Nutritional Assessment / Counseling / Intervention X- 1 5 Lower Extremity Assessment (monofilament, tuning fork, pulses) []  - 0 Peripheral Arterial Disease Assessment (using hand held doppler) ASSESSMENTS - Ostomy and/or Continence Assessment and Care []  - Incontinence Assessment and Management 0 []  - 0 Ostomy Care Assessment and Management (repouching, etc.) PROCESS - Coordination of Care X - Simple Patient / Family Education for ongoing care 1 15 []  - 0 Complex (extensive) Patient / Family Education for ongoing care X- 1 10 Staff obtains Programmer, systems, Records, Test Results / Process Orders []  - 0 Staff telephones HHA, Nursing Homes / Clarify orders / etc []  - 0 Routine Transfer to another Facility (non-emergent condition) []  - 0 Routine Hospital Admission (non-emergent condition) []  - 0 New Admissions / Biomedical engineer / Ordering NPWT, Apligraf, etc. []  - 0 Emergency Hospital Admission (emergent condition) X- 1 10 Simple Discharge Coordination []  - 0 Complex (extensive) Discharge Coordination PROCESS - Special Needs []  - Pediatric / Minor Patient Management 0 []  - 0 Isolation Patient Management []  - 0 Hearing / Language / Visual special needs []  - 0 Assessment of Community assistance (transportation, D/C planning, etc.) []  - 0 Additional assistance / Altered mentation []  - 0 Support Surface(s) Assessment (bed, cushion, seat, etc.) INTERVENTIONS - Wound Cleansing / Measurement [name], [name] ([id number]) X- 1 5 Simple Wound Cleansing - one wound []  -  0 Complex Wound Cleansing - multiple wounds X- 1 5 Wound Imaging (photographs - any number of wounds) []  - 0 Wound Tracing (instead of photographs) X-  1 5 Simple Wound Measurement - one wound []  - 0 Complex Wound Measurement - multiple wounds INTERVENTIONS - Wound Dressings []  - Small Wound Dressing one or multiple wounds 0 []  - 0 Medium Wound Dressing one or multiple wounds []  - 0 Large Wound Dressing one or multiple wounds []  - 0 Application of Medications - topical []  - 0 Application of Medications - injection INTERVENTIONS - Miscellaneous []  - External ear exam 0 []  - 0 Specimen Collection (cultures, biopsies, blood, body fluids, etc.) []  - 0 Specimen(s) / Culture(s) sent or taken to Lab for analysis []  - 0 Patient Transfer (multiple staff / Civil Service fast streamer / Similar devices) []  - 0 Simple Staple / Suture removal (25 or less) []  - 0 Complex Staple / Suture removal (26 or more) []  - 0 Hypo / Hyperglycemic Management (close monitor of Blood Glucose) []  - 0 Ankle / Brachial Index (ABI) - do not check if billed separately X- 1 5 Vital Signs Has the patient been seen at the hospital within the last three years: Yes Total Score: 85 Level Of Care: New/Established - Level 3 Electronic Signature(s) Signed: 07/20/2019 4:58:50 PM By: Montey Hora Entered By: Montey Hora on 07/20/2019 08:50:37 [name], [name] ([id number]) -------------------------------------------------------------------------------- Encounter Discharge Information Details Patient Name: [name]. Date of Service: 07/20/2019 8:00 AM Medical Record Number: [id number] Patient Account Number: [id number] Date of Birth/Sex: 1946-02-28 (74 y.o. F) Treating RN: Montey Hora Primary Care Dhruvi Crenshaw: Clarene Reamer Other Clinician: Referring Jahniya Duzan: Clarene Reamer Treating Paulmichael Schreck/Extender: Melburn Hake, HOYT Weeks in Treatment: 9 Encounter Discharge Information Items Discharge Condition:  Stable Ambulatory Status: Ambulatory Discharge Destination: Home Transportation: Private Auto Accompanied By: self Schedule Follow-up Appointment: No Clinical Summary of Care: Electronic Signature(s) Signed: 07/20/2019 8:51:25 AM By: Montey Hora Entered By: Montey Hora on 07/20/2019 08:51:25 Ayyad, [name] ([id number]) -------------------------------------------------------------------------------- Lower Extremity Assessment Details Patient Name: [name]. Date of Service: 07/20/2019 8:00 AM Medical Record Number: [id number] Patient Account Number: [id number] Date of Birth/Sex: 10-07-1945 (74 y.o. F) Treating RN: Army Melia Primary Care Christella App: Clarene Reamer Other Clinician: Referring Azaela Caracci: Clarene Reamer Treating Malayla Granberry/Extender: Melburn Hake, HOYT Weeks in Treatment: 9 Edema Assessment Assessed: [Left: No] [Right: No] Edema: [Left: Ye] [Right: s] Calf Left: Right: Point of Measurement: 32 cm From Medial Instep 51 cm cm Ankle Left: Right: Point of Measurement: 10 cm From Medial Instep 26 cm cm Vascular Assessment Pulses: Dorsalis Pedis Palpable: [Left:Yes] Electronic Signature(s) Signed: 07/20/2019 2:43:41 PM By: Army Melia Entered By: Army Melia on 07/20/2019 08:17:02 Stahly, [name] ([id number]) -------------------------------------------------------------------------------- [place] Details Patient Name: [name]. Date of Service: 07/20/2019 8:00 AM Medical Record Number: [id number] Patient Account Number: [id number] Date of Birth/Sex: September 17, 1945 (74 y.o. F) Treating RN: Montey Hora Primary Care Merlen Gurry: Clarene Reamer Other Clinician: Referring Dennison Mcdaid: Clarene Reamer Treating Rolene Andrades/Extender: Melburn Hake, HOYT Weeks in Treatment: 9 Active Inactive Electronic Signature(s) Signed: 07/20/2019 4:58:50 PM By: Montey Hora Entered By: Montey Hora on 07/20/2019 08:26:45 Donaway, [name] -------------------------------------------------------------------------------- Pain Assessment Details Patient Name: [name]. Date of Service: 07/20/2019 8:00 AM Medical Record Number: [id number] Patient Account Number: [id number] Date of Birth/Sex: 09-Apr-1945 (74 y.o. F) Treating RN: Army Melia Primary Care Danelly Hassinger: Clarene Reamer Other Clinician: Referring Minie Roadcap: Clarene Reamer Treating Nollan Muldrow/Extender: Melburn Hake, HOYT Weeks in Treatment: 9 Active Problems Location of Pain Severity and Description of Pain Patient Has Paino No Site Locations Pain Management and Medication Current Pain Management: Electronic Signature(s) Signed: 07/20/2019 2:43:41 PM By: Army Melia  Entered By: Army Melia on 07/20/2019 [id number] [name] -------------------------------------------------------------------------------- Patient/Caregiver Education Details Patient Name: [name] Date of Service: 07/20/2019 8:00 AM Medical Record Number: [id number] Patient Account Number: [id number] Date of Birth/Gender: September 27, 1945 (74 y.o. F) Treating RN: Montey Hora Primary Care Physician: Clarene Reamer Other Clinician: Referring Physician: Clarene Reamer Treating Physician/Extender: Sharalyn Ink in Treatment: 9 Education Assessment Education Provided To: Patient Education Topics Provided Venous: Handouts: Other: need for ongoing compression Methods: Explain/Verbal Responses: State content correctly Electronic Signature(s) Signed: 07/20/2019 4:58:50 PM By: Montey Hora Entered By: Montey Hora on 07/20/2019 08:51:04 Culbreath, [name] ([id number]) -------------------------------------------------------------------------------- Wound Assessment Details Patient Name: [name] Date of Service: 07/20/2019 8:00 AM Medical Record Number: [id number] Patient Account Number: [id number] Date of Birth/Sex: 04-23-1945 (74 y.o.  F) Treating RN: Montey Hora Primary Care Kersten Salmons: Clarene Reamer Other Clinician: Referring Naftoli Penny: Clarene Reamer Treating Cordae Mccarey/Extender: Melburn Hake, HOYT Weeks in Treatment: 9 Wound Status Wound Number: 1 Primary Etiology: Lymphedema Wound Location: Left, Medial Lower Leg Wound Status: Healed - Epithelialized Wounding Event: Gradually Appeared Comorbid History: Hypertension Date Acquired: 08/24/2018 Weeks Of Treatment: 9 Clustered Wound: No Photos Wound Measurements Length: (cm) 0 Width: (cm) 0 Depth: (cm) 0 Area: (cm) 0 Volume: (cm) 0 % Reduction in Area: 100% % Reduction in Volume: 100% Epithelialization: Medium (34-66%) Wound Description Classification: Full Thickness Without Exposed Support Structu Exudate Amount: Medium Exudate Type: Serosanguineous Exudate Color: red, brown res Foul Odor After Cleansing: No Slough/Fibrino Yes Wound Bed Granulation Amount: Medium (34-66%) Exposed Structure Granulation Quality: Red Fascia Exposed: No Necrotic Amount: Medium (34-66%) Fat Layer (Subcutaneous Tissue) Exposed: Yes Necrotic Quality: Adherent Slough Tendon Exposed: No Muscle Exposed: No Joint Exposed: No Bone Exposed: No Electronic Signature(s) Signed: 07/20/2019 4:58:50 PM By: Montey Hora Entered By: Montey Hora on 07/20/2019 08:25:55 Urquidi, [name] ([id number]) -------------------------------------------------------------------------------- Vitals Details Patient Name: [name]. Date of Service: 07/20/2019 8:00 AM Medical Record Number: [id number] Patient Account Number: [id number] Date of Birth/Sex: 30-Nov-1945 (74 y.o. F) Treating RN: Army Melia Primary Care Stephane Niemann: Clarene Reamer Other Clinician: Referring Lequisha Cammack: Clarene Reamer Treating Agusta Hackenberg/Extender: Melburn Hake, HOYT Weeks in Treatment: 9 Vital Signs Time Taken: 08:08 Temperature (F): 97.7 Height (in): 62 Pulse (bpm): 68 Weight (lbs): 380 Respiratory Rate  (breaths/min): 16 Body Mass Index (BMI): 69.5 Blood Pressure (mmHg): 147/57 Reference Range: 80 - 120 mg / dl Electronic Signature(s) Signed: 07/20/2019 2:43:41 PM By: Army Melia Entered By: Army Melia on 07/20/2019 08:09:00

## 2019-10-04 ENCOUNTER — Encounter: Payer: Self-pay | Admitting: Family Medicine

## 2019-10-04 ENCOUNTER — Other Ambulatory Visit: Payer: Self-pay | Admitting: Family Medicine

## 2019-10-04 DIAGNOSIS — E7800 Pure hypercholesterolemia, unspecified: Secondary | ICD-10-CM

## 2019-10-04 DIAGNOSIS — I10 Essential (primary) hypertension: Secondary | ICD-10-CM

## 2019-10-04 DIAGNOSIS — E2839 Other primary ovarian failure: Secondary | ICD-10-CM

## 2019-10-04 DIAGNOSIS — Z6841 Body Mass Index (BMI) 40.0 and over, adult: Secondary | ICD-10-CM

## 2019-10-04 DIAGNOSIS — L03119 Cellulitis of unspecified part of limb: Secondary | ICD-10-CM

## 2019-10-04 DIAGNOSIS — R7303 Prediabetes: Secondary | ICD-10-CM

## 2019-10-08 ENCOUNTER — Other Ambulatory Visit (INDEPENDENT_AMBULATORY_CARE_PROVIDER_SITE_OTHER): Payer: Medicare Other

## 2019-10-08 ENCOUNTER — Other Ambulatory Visit: Payer: Self-pay

## 2019-10-08 DIAGNOSIS — R7303 Prediabetes: Secondary | ICD-10-CM | POA: Diagnosis not present

## 2019-10-08 DIAGNOSIS — E7800 Pure hypercholesterolemia, unspecified: Secondary | ICD-10-CM | POA: Diagnosis not present

## 2019-10-08 DIAGNOSIS — L03119 Cellulitis of unspecified part of limb: Secondary | ICD-10-CM

## 2019-10-08 DIAGNOSIS — I10 Essential (primary) hypertension: Secondary | ICD-10-CM | POA: Diagnosis not present

## 2019-10-08 LAB — LIPID PANEL
Cholesterol: 190 mg/dL (ref 0–200)
HDL: 45.5 mg/dL (ref >39.00)
LDL Cholesterol: 109 mg/dL — ABNORMAL HIGH (ref 0–99)
NonHDL: 144.81
Total CHOL/HDL Ratio: 4
Triglycerides: 180 mg/dL — ABNORMAL HIGH (ref 0.0–149.0)
VLDL: 36 mg/dL (ref 0.0–40.0)

## 2019-10-08 LAB — CBC WITH DIFFERENTIAL/PLATELET
Basophils Absolute: 0.1 10*3/uL (ref 0.0–0.1)
Basophils Relative: 1.9 % (ref 0.0–3.0)
Eosinophils Absolute: 0.2 10*3/uL (ref 0.0–0.7)
Eosinophils Relative: 5.4 % — ABNORMAL HIGH (ref 0.0–5.0)
HCT: 39 % (ref 36.0–46.0)
Hemoglobin: 13.1 g/dL (ref 12.0–15.0)
Lymphocytes Relative: 44.4 % (ref 12.0–46.0)
Lymphs Abs: 1.9 10*3/uL (ref 0.7–4.0)
MCHC: 33.7 g/dL (ref 30.0–36.0)
MCV: 82.8 fl (ref 78.0–100.0)
Monocytes Absolute: 0.4 10*3/uL (ref 0.1–1.0)
Monocytes Relative: 10 % (ref 3.0–12.0)
Neutro Abs: 1.7 10*3/uL (ref 1.4–7.7)
Neutrophils Relative %: 38.3 % — ABNORMAL LOW (ref 43.0–77.0)
Platelets: 235 10*3/uL (ref 150.0–400.0)
RBC: 4.71 Mil/uL (ref 3.87–5.11)
RDW: 14.3 % (ref 11.5–15.5)
WBC: 4.3 10*3/uL (ref 4.0–10.5)

## 2019-10-08 LAB — COMPREHENSIVE METABOLIC PANEL
ALT: 13 U/L (ref 0–35)
AST: 18 U/L (ref 0–37)
Albumin: 4.1 g/dL (ref 3.5–5.2)
Alkaline Phosphatase: 53 U/L (ref 39–117)
BUN: 25 mg/dL — ABNORMAL HIGH (ref 6–23)
CO2: 31 mEq/L (ref 19–32)
Calcium: 9.8 mg/dL (ref 8.4–10.5)
Chloride: 100 mEq/L (ref 96–112)
Creatinine, Ser: 1.12 mg/dL (ref 0.40–1.20)
GFR: 47.65 mL/min — ABNORMAL LOW (ref >60.00)
Glucose, Bld: 106 mg/dL — ABNORMAL HIGH (ref 70–99)
Potassium: 4 mEq/L (ref 3.5–5.1)
Sodium: 139 mEq/L (ref 135–145)
Total Bilirubin: 0.8 mg/dL (ref 0.2–1.2)
Total Protein: 6.8 g/dL (ref 6.0–8.3)

## 2019-10-08 LAB — HEMOGLOBIN A1C: Hgb A1c MFr Bld: 5.8 % (ref 4.6–6.5)

## 2019-10-08 LAB — TSH: TSH: 1.2 u[IU]/mL (ref 0.35–4.50)

## 2019-10-15 ENCOUNTER — Ambulatory Visit (INDEPENDENT_AMBULATORY_CARE_PROVIDER_SITE_OTHER): Payer: Medicare Other | Admitting: Family Medicine

## 2019-10-15 ENCOUNTER — Other Ambulatory Visit: Payer: Self-pay

## 2019-10-15 ENCOUNTER — Encounter: Payer: Self-pay | Admitting: Gastroenterology

## 2019-10-15 ENCOUNTER — Encounter: Payer: Self-pay | Admitting: Family Medicine

## 2019-10-15 VITALS — BP 110/62 | HR 112 | Temp 97.8°F | Ht 63.0 in | Wt 271.4 lb

## 2019-10-15 DIAGNOSIS — Z1211 Encounter for screening for malignant neoplasm of colon: Secondary | ICD-10-CM

## 2019-10-15 DIAGNOSIS — Z6841 Body Mass Index (BMI) 40.0 and over, adult: Secondary | ICD-10-CM | POA: Diagnosis not present

## 2019-10-15 DIAGNOSIS — I872 Venous insufficiency (chronic) (peripheral): Secondary | ICD-10-CM

## 2019-10-15 DIAGNOSIS — Z0000 Encounter for general adult medical examination without abnormal findings: Secondary | ICD-10-CM | POA: Diagnosis not present

## 2019-10-15 NOTE — Progress Notes (Signed)
Subjective:    Patient ID: [name], female    DOB: 1945-07-06, 74 y.o.   MRN: [id number]  HPI Chief Complaint  Patient presents with   Annual Exam    no new concerns   This is a 74 yo female who presents today for annual exam. Has been doing well.   Last CPE- 09/30/2018 Mammo- 04/13/2019 Pap- 09/10/2012, aged out Colonoscopy-08/27/2013 Tdap-09/18/2016 Flu- annual Eye-regular Dental-dentures Exercise- not regular  Lymphedema- was seen by vascular and wound clinic with significant improvement of open wounds/ cellulitis, using lymph press twice a day with good results.   Hyperlipidemia- has been taking omega 3, does not want to go on meds.   Obesity- breakfast- cheerios, bannana, Lunch- tuna salad, vegetables, Dinner- beans, vegetables, meat, Snacks- berries, sweets, chips. Has cut out soda.   Review of Systems     Objective:   Physical Exam Vitals reviewed.  Constitutional:      General: She is not in acute distress.    Appearance: Normal appearance. She is obese. She is not ill-appearing, toxic-appearing or diaphoretic.  HENT:     Head: Normocephalic and atraumatic.  Eyes:     Conjunctiva/sclera: Conjunctivae normal.  Cardiovascular:     Rate and Rhythm: Normal rate and regular rhythm.     Heart sounds: Normal heart sounds.  Pulmonary:     Effort: Pulmonary effort is normal.     Breath sounds: Normal breath sounds.  Chest:     Breasts:        Right: Normal.        Left: Normal.  Abdominal:     General: Abdomen is flat. Bowel sounds are normal. There is no distension.     Palpations: Abdomen is soft. There is no mass.     Tenderness: There is no abdominal tenderness. There is no guarding or rebound.     Hernia: No hernia is present.  Musculoskeletal:     Cervical back: Normal range of motion and neck supple.     Comments: Trace bilateral edema.   Skin:    General: Skin is warm and dry.     Comments: Bilateral legs with chronic skin changes,  erythema, hyperpigmentation, left leg with scattered patchy areas. Dry areas on legs.   Neurological:     Mental Status: She is alert and oriented to person, place, and time.  Psychiatric:        Mood and Affect: Mood normal.        Behavior: Behavior normal.        Thought Content: Thought content normal.        Judgment: Judgment normal.       BP (!) 110/62 (BP Location: Left Arm, Patient Position: Sitting, Cuff Size: Large)    Pulse (!) 112    Temp 97.8 F (36.6 C) (Temporal)    Ht 5\' 3"  (1.6 m)    Wt (!) 271 lb 6.4 oz (123.1 kg)    SpO2 94%    BMI 48.08 kg/m  Wt Readings from Last 3 Encounters:  10/15/19 (!) 271 lb 6.4 oz (123.1 kg)  05/11/19 279 lb 12.8 oz (126.9 kg)  04/23/19 283 lb (128.4 kg)       Assessment & Plan:  1. Annual physical exam - Discussed and encouraged healthy lifestyle choices- adequate sleep, regular exercise, stress management and healthy food choices.    2. Screening for colon cancer - overdue for follow up colonoscopy - Ambulatory referral to Gastroenterology  3. Morbid obesity  with BMI of 45.0-49.9, adult (HCC) - has had 12 pound weight loss in last 6 months, counseled her regarding diet and encouraged her to increase walking  4. Chronic venous stasis dermatitis of both lower extremities - will prescribe betamethasone valerate foam, not sure if covered by her insurance, if not will try lotion.   - follow up in 6 months  This visit occurred during the SARS-CoV-2 public health emergency.  Safety protocols were in place, including screening questions prior to the visit, additional usage of staff PPE, and extensive cleaning of exam room while observing appropriate contact time as indicated for disinfecting solutions.    Clarene Reamer, FNP-BC  [facility]  10/18/2019 9:27 AM

## 2019-10-15 NOTE — Patient Instructions (Signed)
Great to see you today!!  Follow up in 6 months to recheck your cholesterol and follow up on your weight   Keep up the good work with your healthy food choices, water intake, get back to swimming and try to walk as much as possible.   Resources I like-  Www.dietdoctor.com  Youtube- Dr. Sharman Cheek, Dr. Enrigue Catena  A resource that I like is www.dietdoctor.com/diabetes/diet  Here are some guidelines to help you with meal planning -  If you are not hungry, don't eat  Avoid all processed and packaged foods (bread, pasta, crackers, chips, etc) and beverages containing calories.  Avoid added sugars and excessive natural sugars.  Attention to how you feel if you consume artificial sweeteners.  Do they make you more hungry or raise your blood sugar?  With every meal and snack, aim to get 20 g of protein (3 ounces of meat, 4 ounces of fish, 3 eggs, protein powder, 1 cup [place] yogurt, 1 cup cottage cheese, etc.)  Increase fiber in the form of non-starchy vegetables.  These help you feel full with very little carbohydrates and are good for gut health.  Eat 1 serving healthy carb per meal- 1/2 cup brown rice, beans, potato, corn- pay attention to whether or not this significantly raises your blood sugar. If it does, reduce the frequency you consume these.   Eat 2-3 servings of lower sugar fruits daily.  This includes berries, apples, oranges, peaches, pears, one half banana.  Have small amounts of good fats such as avocado, nuts, olive oil, nut butters, olives.  Add a little cheese to your salads to make them tasty.

## 2019-10-18 ENCOUNTER — Encounter: Payer: Self-pay | Admitting: Family Medicine

## 2019-10-18 MED ORDER — BETAMETHASONE VALERATE 0.12 % EX FOAM: 1.0000 "application " | Freq: Two times a day (BID) | CUTANEOUS | 1 refills | Status: DC

## 2019-10-22 ENCOUNTER — Other Ambulatory Visit: Payer: Self-pay | Admitting: *Deleted

## 2019-10-22 MED ORDER — RAMIPRIL 5 MG PO CAPS: ORAL_CAPSULE | ORAL | 3 refills | Status: DC

## 2019-11-30 ENCOUNTER — Encounter: Payer: Self-pay | Admitting: Family Medicine

## 2019-11-30 DIAGNOSIS — H35372 Puckering of macula, left eye: Secondary | ICD-10-CM | POA: Diagnosis not present

## 2019-11-30 DIAGNOSIS — H5213 Myopia, bilateral: Secondary | ICD-10-CM | POA: Diagnosis not present

## 2019-11-30 DIAGNOSIS — H524 Presbyopia: Secondary | ICD-10-CM | POA: Diagnosis not present

## 2019-11-30 DIAGNOSIS — H2513 Age-related nuclear cataract, bilateral: Secondary | ICD-10-CM | POA: Diagnosis not present

## 2019-11-30 DIAGNOSIS — H353132 Nonexudative age-related macular degeneration, bilateral, intermediate dry stage: Secondary | ICD-10-CM | POA: Diagnosis not present

## 2019-12-01 ENCOUNTER — Other Ambulatory Visit: Payer: Self-pay | Admitting: Family Medicine

## 2019-12-09 ENCOUNTER — Other Ambulatory Visit: Payer: Self-pay

## 2019-12-09 ENCOUNTER — Ambulatory Visit (AMBULATORY_SURGERY_CENTER): Payer: Self-pay

## 2019-12-09 VITALS — Ht 63.0 in | Wt 273.0 lb

## 2019-12-09 DIAGNOSIS — Z1211 Encounter for screening for malignant neoplasm of colon: Secondary | ICD-10-CM

## 2019-12-09 DIAGNOSIS — Z800 Family history of malignant neoplasm of digestive organs: Secondary | ICD-10-CM

## 2019-12-09 MED ORDER — NA SULFATE-K SULFATE-MG SULF 17.5-3.13-1.6 GM/177ML PO SOLN: 1.0000 | Freq: Once | ORAL | 0 refills | Status: AC

## 2019-12-09 NOTE — Progress Notes (Signed)
No egg or soy allergy known to patient  No issues with past sedation with any surgeries or procedures---patient reports she has had PONV in the past No intubation problems in the past  No FH of Malignant Hyperthermia No diet pills per patient No home 02 use per patient  No blood thinners per patient  Pt denies issues with constipation  No A fib or A flutter  EMMI video via MyChart  COVID 19 guidelines implemented in [place] today with Pt and RN  Coupon given to pt in [facility] today , Code to Pharmacy  COVID vaccines completed on 04/2019 per pt;  Due to the COVID-19 pandemic we are asking patients to follow these guidelines. Please only bring one care partner. Please be aware that your care partner may wait in the car in the parking lot or if they feel like they will be too hot to wait in the car, they may wait in the lobby on the 4th floor. All care partners are required to wear a mask the entire time (we do not have any that we can provide them), they need to practice social distancing, and we will do a Covid check for all patient's and care partners when you arrive. Also we will check their temperature and your temperature. If the care partner waits in their car they need to stay in the parking lot the entire time and we will call them on their cell phone when the patient is ready for discharge so they can bring the car to the front of the building. Also all patient's will need to wear a mask into building.

## 2019-12-18 ENCOUNTER — Other Ambulatory Visit: Payer: Self-pay

## 2019-12-18 ENCOUNTER — Ambulatory Visit (INDEPENDENT_AMBULATORY_CARE_PROVIDER_SITE_OTHER): Payer: Medicare Other

## 2019-12-18 DIAGNOSIS — Z23 Encounter for immunization: Secondary | ICD-10-CM | POA: Diagnosis not present

## 2019-12-20 DIAGNOSIS — Z23 Encounter for immunization: Secondary | ICD-10-CM | POA: Diagnosis not present

## 2019-12-23 ENCOUNTER — Encounter: Payer: Self-pay | Admitting: Gastroenterology

## 2019-12-23 ENCOUNTER — Other Ambulatory Visit: Payer: Self-pay

## 2019-12-23 ENCOUNTER — Ambulatory Visit (AMBULATORY_SURGERY_CENTER): Payer: Medicare Other | Admitting: Gastroenterology

## 2019-12-23 VITALS — BP 114/65 | HR 73 | Temp 97.7°F | Resp 21 | Ht 63.0 in | Wt 273.0 lb

## 2019-12-23 DIAGNOSIS — Z1211 Encounter for screening for malignant neoplasm of colon: Secondary | ICD-10-CM | POA: Diagnosis not present

## 2019-12-23 DIAGNOSIS — D124 Benign neoplasm of descending colon: Secondary | ICD-10-CM | POA: Diagnosis not present

## 2019-12-23 DIAGNOSIS — Z800 Family history of malignant neoplasm of digestive organs: Secondary | ICD-10-CM | POA: Diagnosis not present

## 2019-12-23 DIAGNOSIS — D123 Benign neoplasm of transverse colon: Secondary | ICD-10-CM

## 2019-12-23 DIAGNOSIS — D122 Benign neoplasm of ascending colon: Secondary | ICD-10-CM

## 2019-12-23 DIAGNOSIS — K635 Polyp of colon: Secondary | ICD-10-CM | POA: Diagnosis not present

## 2019-12-23 MED ORDER — SODIUM CHLORIDE 0.9 % IV SOLN: 500.0000 mL | Freq: Once | INTRAVENOUS | Status: DC

## 2019-12-23 NOTE — Op Note (Signed)
[facility] Patient Name: [name] Procedure Date: 12/23/2019 8:41 AM MRN: [id number] Endoscopist: Mauri Pole , MD Age: 74 Referring MD:  Date of Birth: [birth year] Gender: Female Account #: [id number] Procedure:                Colonoscopy Indications:              Screening in patient at increased risk: Family                            history of 1st-degree relative with colorectal                            cancer Medicines:                Monitored Anesthesia Care Procedure:                Pre-Anesthesia Assessment:                           - Prior to the procedure, a History and Physical                            was performed, and patient medications and                            allergies were reviewed. The patient's tolerance of                            previous anesthesia was also reviewed. The risks                            and benefits of the procedure and the sedation                            options and risks were discussed with the patient.                            All questions were answered, and informed consent                            was obtained. Prior Anticoagulants: The patient has                            taken no previous anticoagulant or antiplatelet                            agents. ASA Grade Assessment: III - A patient with                            severe systemic disease. After reviewing the risks                            and benefits, the patient was deemed in  satisfactory condition to undergo the procedure.                           After obtaining informed consent, the colonoscope                            was passed under direct vision. Throughout the                            procedure, the patient's blood pressure, pulse, and                            oxygen saturations were monitored continuously. The                            Colonoscope was introduced through the anus and                             advanced to the the cecum, identified by                            appendiceal orifice and ileocecal valve. The                            colonoscopy was performed without difficulty. The                            patient tolerated the procedure well. The quality                            of the bowel preparation was excellent. The                            ileocecal valve, appendiceal orifice, and rectum                            were photographed. Scope In: 8:58:42 AM Scope Out: 9:17:05 AM Scope Withdrawal Time: 0 hours 11 minutes 56 seconds  Total Procedure Duration: 0 hours 18 minutes 23 seconds  Findings:                 The perianal and digital rectal examinations were                            normal.                           Three sessile polyps were found in the descending                            colon, transverse colon and ascending colon. The                            polyps were 5 to 7 mm in size. These polyps were  removed with a cold snare. Resection and retrieval                            were complete.                           A less than 1 mm polyp was found in the descending                            colon. The polyp was sessile. The polyp was removed                            with a cold biopsy forceps. Resection and retrieval                            were complete.                           Multiple small and large-mouthed diverticula were                            found in the sigmoid colon and descending colon.                           Non-bleeding internal hemorrhoids were found during                            retroflexion. The hemorrhoids were small. Complications:            No immediate complications. Estimated Blood Loss:     Estimated blood loss was minimal. Impression:               - Three 5 to 7 mm polyps in the descending colon,                            in the transverse colon and in  the ascending colon,                            removed with a cold snare. Resected and retrieved.                           - One less than 1 mm polyp in the descending colon,                            removed with a cold biopsy forceps. Resected and                            retrieved.                           - Diverticulosis in the sigmoid colon and in the                            descending colon.                           -  Non-bleeding internal hemorrhoids. Recommendation:           - Patient has a contact number available for                            emergencies. The signs and symptoms of potential                            delayed complications were discussed with the                            patient. Return to normal activities tomorrow.                            Written discharge instructions were provided to the                            patient.                           - Resume previous diet.                           - Continue present medications.                           - Await pathology results.                           - Repeat colonoscopy in 3 - 5 years for                            surveillance based on pathology results. Mauri Pole, MD 12/23/2019 9:29:30 AM This report has been signed electronically.

## 2019-12-23 NOTE — Patient Instructions (Signed)
Handouts were given to your care partner on polyps, diverticulosis, and diverticulosis. You may resume your current medications today. Await biopsy results. Please call if any questions or concerns.     YOU HAD AN ENDOSCOPIC PROCEDURE TODAY AT [facility]:   Refer to the procedure report that was given to you for any specific questions about what was found during the examination.  If the procedure report does not answer your questions, please call your gastroenterologist to clarify.  If you requested that your care partner not be given the details of your procedure findings, then the procedure report has been included in a sealed envelope for you to review at your convenience later.  YOU SHOULD EXPECT: Some feelings of bloating in the abdomen. Passage of more gas than usual.  Walking can help get rid of the air that was put into your GI tract during the procedure and reduce the bloating. If you had a lower endoscopy (such as a colonoscopy or flexible sigmoidoscopy) you may notice spotting of blood in your stool or on the toilet paper. If you underwent a bowel prep for your procedure, you may not have a normal bowel movement for a few days.  Please Note:  You might notice some irritation and congestion in your nose or some drainage.  This is from the oxygen used during your procedure.  There is no need for concern and it should clear up in a day or so.  SYMPTOMS TO REPORT IMMEDIATELY:   Following lower endoscopy (colonoscopy or flexible sigmoidoscopy):  Excessive amounts of blood in the stool  Significant tenderness or worsening of abdominal pains  Swelling of the abdomen that is new, acute  Fever of 100F or higher   For urgent or emergent issues, a gastroenterologist can be reached at any hour by calling [phone]. Do not use MyChart messaging for urgent concerns.    DIET:  We do recommend a small meal at first, but then you may proceed to your regular diet.  Drink  plenty of fluids but you should avoid alcoholic beverages for 24 hours.  ACTIVITY:  You should plan to take it easy for the rest of today and you should NOT DRIVE or use heavy machinery until tomorrow (because of the sedation medicines used during the test).    FOLLOW UP: Our staff will call the number listed on your records 48-72 hours following your procedure to check on you and address any questions or concerns that you may have regarding the information given to you following your procedure. If we do not reach you, we will leave a message.  We will attempt to reach you two times.  During this call, we will ask if you have developed any symptoms of COVID 19. If you develop any symptoms (ie: fever, flu-like symptoms, shortness of breath, cough etc.) before then, please call [phone].  If you test positive for Covid 19 in the 2 weeks post procedure, please call and report this information to [place].    If any biopsies were taken you will be contacted by phone or by letter within the next 1-3 weeks.  Please call us at [phone] if you have not heard about the biopsies in 3 weeks.    SIGNATURES/CONFIDENTIALITY: You and/or your care partner have signed paperwork which will be entered into your electronic medical record.  These signatures attest to the fact that that the information above on your After Visit Summary has been reviewed and is understood.  Full responsibility of the confidentiality of this discharge information lies with you and/or your care-partner.

## 2019-12-23 NOTE — Progress Notes (Signed)
Called to room to assist during endoscopic procedure.  Patient ID and intended procedure confirmed with present staff. Received instructions for my participation in the procedure from the performing physician.  

## 2019-12-23 NOTE — Progress Notes (Signed)
Pt's states no medical or surgical changes since previsit or office visit. 

## 2019-12-23 NOTE — Progress Notes (Signed)
VS taken by MO 

## 2019-12-23 NOTE — Progress Notes (Signed)
A and O x3. Report to RN. Tolerated MAC anesthesia well.

## 2019-12-23 NOTE — Progress Notes (Signed)
No problems noted in the recovery room. maw 

## 2019-12-27 ENCOUNTER — Telehealth: Payer: Self-pay | Admitting: *Deleted

## 2019-12-27 NOTE — Telephone Encounter (Signed)
  Follow up Call-  Call back number 12/23/2019  Post procedure Call Back phone  # [phone]  Permission to leave phone message Yes  Some recent data might be hidden     Patient questions:  Do you have a fever, pain , or abdominal swelling? No. Pain Score  0 *  Have you tolerated food without any problems? Yes.    Have you been able to return to your normal activities? Yes.    Do you have any questions about your discharge instructions: Diet   No. Medications  No. Follow up visit  No.  Do you have questions or concerns about your Care? No.  Actions: * If pain score is 4 or above: No action needed, pain <4.  1. Have you developed a fever since your procedure? no  2.   Have you had an respiratory symptoms (SOB or cough) since your procedure? no  3.   Have you tested positive for COVID 19 since your procedure no  4.   Have you had any family members/close contacts diagnosed with the COVID 19 since your procedure?  no   If yes to any of these questions please route to Joylene John, RN and Joella Prince, RN

## 2019-12-30 ENCOUNTER — Encounter: Payer: Self-pay | Admitting: Gastroenterology

## 2020-02-29 ENCOUNTER — Other Ambulatory Visit: Payer: Self-pay | Admitting: Family Medicine

## 2020-02-29 DIAGNOSIS — Z1231 Encounter for screening mammogram for malignant neoplasm of breast: Secondary | ICD-10-CM

## 2020-03-09 ENCOUNTER — Encounter: Payer: Self-pay | Admitting: Family Medicine

## 2020-03-10 ENCOUNTER — Other Ambulatory Visit: Payer: Self-pay | Admitting: Family Medicine

## 2020-03-10 DIAGNOSIS — I872 Venous insufficiency (chronic) (peripheral): Secondary | ICD-10-CM

## 2020-03-10 MED ORDER — BETAMETHASONE VALERATE 0.12 % EX FOAM: 1.0000 "application " | Freq: Two times a day (BID) | CUTANEOUS | 1 refills | Status: DC | PRN

## 2020-04-13 ENCOUNTER — Ambulatory Visit
Admission: RE | Admit: 2020-04-13 | Discharge: 2020-04-13 | Disposition: A | Discharge status: Home / Self Care | Payer: Medicare Other | Source: Ambulatory Visit | Attending: Family Medicine | Admitting: Family Medicine

## 2020-04-13 ENCOUNTER — Other Ambulatory Visit: Payer: Self-pay

## 2020-04-13 DIAGNOSIS — Z1231 Encounter for screening mammogram for malignant neoplasm of breast: Secondary | ICD-10-CM

## 2020-04-17 ENCOUNTER — Ambulatory Visit: Payer: Medicare Other | Admitting: Family Medicine

## 2020-04-27 ENCOUNTER — Ambulatory Visit (INDEPENDENT_AMBULATORY_CARE_PROVIDER_SITE_OTHER): Payer: Medicare Other | Admitting: Family Medicine

## 2020-04-27 ENCOUNTER — Encounter: Payer: Self-pay | Admitting: Family Medicine

## 2020-04-27 ENCOUNTER — Other Ambulatory Visit: Payer: Self-pay

## 2020-04-27 VITALS — BP 118/70 | HR 78 | Temp 97.8°F | Ht 63.0 in | Wt 272.2 lb

## 2020-04-27 DIAGNOSIS — I10 Essential (primary) hypertension: Secondary | ICD-10-CM | POA: Diagnosis not present

## 2020-04-27 DIAGNOSIS — I872 Venous insufficiency (chronic) (peripheral): Secondary | ICD-10-CM

## 2020-04-27 DIAGNOSIS — E7800 Pure hypercholesterolemia, unspecified: Secondary | ICD-10-CM

## 2020-04-27 NOTE — Assessment & Plan Note (Signed)
BP well controlled. Cont ramipril 5 mg and HCTZ 25 mg

## 2020-04-27 NOTE — Progress Notes (Signed)
Subjective:     [name] is a 75 y.o. female presenting for Transitions Of Care (No concerns )     HPI  Husband has bladder cancer  She helps with bag changes of nephrostomy tubes  #HLD - never discussed statin - not interested right now  #HTN - taking medication no concerns  Review of Systems   Social History   Tobacco Use  Smoking Status Never Smoker  Smokeless Tobacco Never Used        Objective:    BP Readings from Last 3 Encounters:  04/27/20 118/70  12/23/19 114/65  10/15/19 (!) 110/62   Wt Readings from Last 3 Encounters:  04/27/20 272 lb 4 oz (123.5 kg)  12/23/19 273 lb (123.8 kg)  12/09/19 273 lb (123.8 kg)    BP 118/70   Pulse 78   Temp 97.8 F (36.6 C) (Temporal)   Ht 5\' 3"  (1.6 m)   Wt 272 lb 4 oz (123.5 kg)   SpO2 97%   BMI 48.23 kg/m    Physical Exam Constitutional:      General: She is not in acute distress.    Appearance: She is well-developed. She is not diaphoretic.  HENT:     Right Ear: External ear normal.     Left Ear: External ear normal.  Eyes:     Conjunctiva/sclera: Conjunctivae normal.  Cardiovascular:     Rate and Rhythm: Normal rate and regular rhythm.     Heart sounds: No murmur heard.   Pulmonary:     Effort: Pulmonary effort is normal. No respiratory distress.     Breath sounds: Normal breath sounds. No wheezing.  Musculoskeletal:     Cervical back: Neck supple.  Skin:    General: Skin is warm and dry.     Capillary Refill: Capillary refill takes less than 2 seconds.     Comments: LE erythema  Neurological:     Mental Status: She is alert. Mental status is at baseline.  Psychiatric:        Mood and Affect: Mood normal.        Behavior: Behavior normal.      The 10-year ASCVD risk score Mikey Bussing DC Jr., et al., 2013) is: 14.8%   Values used to calculate the score:     Age: 55 years     Sex: Female     Is Non-Hispanic African American: No     Diabetic: No     Tobacco smoker: No      Systolic Blood Pressure: 938 mmHg     Is BP treated: Yes     HDL Cholesterol: 45.5 mg/dL     Total Cholesterol: 190 mg/dL      Assessment & Plan:   Problem List Items Addressed This Visit      Cardiovascular and Mediastinum   Essential hypertension - Primary    BP well controlled. Cont ramipril 5 mg and HCTZ 25 mg        Musculoskeletal and Integument   Chronic venous stasis dermatitis    Stable. Cont prn betamethasone foam        Other   PURE HYPERCHOLESTEROLEMIA    Discussed ASCVD 14.8% and benefit of statin. She would like to reassess at annual visit in 6 months. Cont omega 3 fatty acids.           Return in about 6 months (around 10/25/2020) for wellness visit.  Lesleigh Noe, MD  This visit occurred during the SARS-CoV-2 public  health emergency.  Safety protocols were in place, including screening questions prior to the visit, additional usage of staff PPE, and extensive cleaning of exam room while observing appropriate contact time as indicated for disinfecting solutions.

## 2020-04-27 NOTE — Assessment & Plan Note (Signed)
Stable. Cont prn betamethasone foam

## 2020-04-27 NOTE — Assessment & Plan Note (Signed)
Discussed ASCVD 14.8% and benefit of statin. She would like to reassess at annual visit in 6 months. Cont omega 3 fatty acids.

## 2020-04-27 NOTE — Patient Instructions (Signed)
Great to meet you!  Consider a cholesterol medication like a atorvastatin.   Come back in about 6 months for annual wellness

## 2020-05-25 ENCOUNTER — Telehealth: Payer: Self-pay

## 2020-05-25 MED ORDER — HYDROCHLOROTHIAZIDE 25 MG PO TABS
25.0000 mg | ORAL_TABLET | Freq: Every day | ORAL | 1 refills | Status: DC
Start: 2020-05-25 — End: 2020-11-20

## 2020-05-25 NOTE — Telephone Encounter (Signed)
Pharmacy requests refill on: Hydrochlorothiazide 25 mg   LAST REFILL: 12/01/2019 (Q-90, R-1) LAST OV: 04/27/2020 NEXT OV: 10/25/2020 PHARMACY: [id number] [place], [place]

## 2020-10-20 ENCOUNTER — Ambulatory Visit (INDEPENDENT_AMBULATORY_CARE_PROVIDER_SITE_OTHER): Payer: Medicare Other

## 2020-10-20 DIAGNOSIS — Z0000 Encounter for general adult medical examination without abnormal findings: Secondary | ICD-10-CM | POA: Diagnosis not present

## 2020-10-20 NOTE — Progress Notes (Signed)
PCP notes:  Health Maintenance: Shingrix- due Dexa- due    Abnormal Screenings: none   Patient concerns: none   Nurse concerns: none   Next PCP appt.: 10/25/2020 @ 8:40 am

## 2020-10-20 NOTE — Progress Notes (Signed)
Subjective:   [name] is a 75 y.o. female who presents for Medicare Annual (Subsequent) preventive examination.  Review of Systems: N/A       I connected with the patient today by telephone and verified that I am speaking with the correct person using two identifiers. Location patient: home Location nurse: work Persons participating in the telephone visit: patient, nurse.   I discussed the limitations, risks, security and privacy concerns of performing an evaluation and management service by telephone and the availability of in person appointments. I also discussed with the patient that there may be a patient responsible charge related to this service. The patient expressed understanding and verbally consented to this telephonic visit.        Cardiac Risk Factors include: advanced age (>62mn, >>9women);hypertension;Other (see comment), Risk factor comments: hypercholesterolemia     Objective:    Today's Vitals   There is no height or weight on file to calculate BMI.  Advanced Directives 10/20/2020 05/11/2019 09/30/2018 09/24/2017 08/27/2013 08/18/2013 10/13/2012  Does Patient Have a Medical Advance Directive? Yes Yes Yes No Patient does not have advance directive Patient does not have advance directive Patient does not have advance directive;Patient would like information  Type of Advance Directive [place] will [place] will [place] will - - - -  Does patient want to make changes to medical advance directive? - No - Patient declined No - Patient declined - - - -  Copy of [place] Chart? No - copy requested - No - copy requested - - - -  Would patient like information on creating a medical advance directive? - - - No - Patient declined - - Advance directive packet given  Pre-existing out of facility DNR order (yellow form or pink MOST form) - - - - No No No    Current  Medications (verified) Outpatient Encounter Medications as of 10/20/2020  Medication Sig   Betamethasone Valerate 0.12 % foam Apply 1 application topically 2 (two) times daily as needed.   Calcium Carbonate-Vitamin D 600-400 MG-UNIT tablet Take 1 tablet by mouth daily.   hydrochlorothiazide (HYDRODIURIL) 25 MG tablet Take 1 tablet (25 mg total) by mouth daily.   Omega-3 Fatty Acids (FISH OIL PO) Take by mouth.   ramipril (ALTACE) 5 MG capsule TAKE 1 CAPSULE BY MOUTH EVERY DAY   No facility-administered encounter medications on file as of 10/20/2020.    Allergies (verified) Patient has no known allergies.   History: Past Medical History:  Diagnosis Date   Arthritis    "both knees" (07/07/2012)   Hypertension    on meds   PONV (postoperative nausea and vomiting)    Past Surgical History:  Procedure Laterality Date   COLONOSCOPY N/A 08/27/2013   Procedure: COLONOSCOPY;  Surgeon: DLafayette Dragon MD;  Location: [facility];  Service: Endoscopy;  Laterality: N/A;   JOINT REPLACEMENT     TOTAL KNEE ARTHROPLASTY Right 07/07/2012   TOTAL KNEE ARTHROPLASTY Right 07/07/2012   Procedure: TOTAL KNEE ARTHROPLASTY;  Surgeon: GMeredith Pel MD;  [facility]  Service: Orthopedics;  Laterality: Right;  Right Total Knee Arthroplasty   TOTAL KNEE ARTHROPLASTY Left 10/13/2012   Procedure: TOTAL KNEE ARTHROPLASTY;  Surgeon: GMeredith Pel MD;  [facility]  Service: Orthopedics;  Laterality: Left;   VAGINAL DELIVERY     X3   Family History  Problem Relation Age of Onset   Colon cancer  Mother 11   Colon polyps Mother 61   Colon cancer Father 31   Colon polyps Father 44   Esophageal cancer Neg Hx    Rectal cancer Neg Hx    Stomach cancer Neg Hx    Social History   Socioeconomic History   Marital status: Married    Spouse name: [name]   Number of children: 3   Years of education: high school   Highest education level: Not on file  Occupational History   Not on file  Tobacco  Use   Smoking status: Never   Smokeless tobacco: Never  Vaping Use   Vaping Use: Never used  Substance and Sexual Activity   Alcohol use: No   Drug use: Yes   Sexual activity: Not Currently  Other Topics Concern   Not on file  Social History Narrative   Desires CPR.   Would want life support but not for prolonged period of time.   Does not want feeding tubes.   Has discussed this with her husband, [name].      04/27/20   From: the area   Living: with husband, [name] [phone])    Work: retired - Marine scientist      Family: 3 children - [name], [name], and [name] - 4 granddaughters      Enjoys: swimming      Exercise: not currently   Diet: not great      Safety   Seat [name]: Yes    Guns: Yes  and secure   Safe in relationships: Yes    Social Determinants of Health   Financial Resource Strain: Low Risk    Difficulty of Paying Living Expenses: Not hard at all  Food Insecurity: No Food Insecurity   Worried About Charity fundraiser in the Last Year: Never true   [place] in the Last Year: Never true  Transportation Needs: No Transportation Needs   Lack of Transportation (Medical): No   Lack of Transportation (Non-Medical): No  Physical Activity: Insufficiently Active   Days of Exercise per Week: 2 days   Minutes of Exercise per Session: 60 min  Stress: No Stress Concern Present   Feeling of Stress : Not at all  Social Connections: Not on file    Tobacco Counseling Counseling given: Not Answered   Clinical Intake:  Pre-visit preparation completed: Yes  Pain : No/denies pain     Nutritional Risks: None Diabetes: No  How often do you need to have someone help you when you read instructions, pamphlets, or other written materials from your doctor or pharmacy?: 1 - Never  Diabetic: No Nutrition Risk Assessment:  Has the patient had any N/V/D within the last 2 months?  No  Does the patient have any non-healing wounds?  No  Has the patient had any  unintentional weight loss or weight gain?  No   Diabetes:  Is the patient diabetic?  No  If diabetic, was a CBG obtained today?   N/A Did the patient bring in their glucometer from home?   N/A How often do you monitor your CBG's? N/A.   Financial Strains and Diabetes Management:  Are you having any financial strains with the device, your supplies or your medication?  N/A .  Does the patient want to be seen by Chronic Care Management for management of their diabetes?   N/A Would the patient like to be referred to a Nutritionist or for Diabetic Management?   N/A  Interpreter Needed?: No  Information entered by :: CJohnson, RN   Activities of Daily Living In your present state of health, do you have any difficulty performing the following activities: 10/20/2020  Hearing? N  Vision? N  Difficulty concentrating or making decisions? N  Walking or climbing stairs? N  Dressing or bathing? N  Doing errands, shopping? N  Preparing Food and eating ? N  Using the Toilet? N  In the past six months, have you accidently leaked urine? N  Do you have problems with loss of bowel control? N  Managing your Medications? N  Managing your Finances? N  Housekeeping or managing your Housekeeping? N  Some recent data might be hidden    Patient Care Team: Lesleigh Noe, MD as PCP - General (Family Medicine) [name], MD (Inactive) as Consulting Physician (Gastroenterology) Marlou Sa Tonna Corner, MD as Consulting Physician (Orthopedic Surgery)  Indicate any recent Medical Services you may have received from other than [facility] providers in the past year (date may be approximate).     Assessment:   This is a routine wellness examination for [place].  Hearing/Vision screen Vision Screening - Comments:: Patient gets annual eye exams   Dietary issues and exercise activities discussed: Current Exercise Habits: Structured exercise class, Type of exercise: Other - see comments (swimming), Time  (Minutes): 60, Frequency (Times/Week): 2, Weekly Exercise (Minutes/Week): 120, Intensity: Moderate, Exercise limited by: None identified   Goals Addressed             This Visit's Progress    Patient Stated       10/20/2020, I will continue to swim 2 days a week for 1 hour.        Depression Screen PHQ 2/9 Scores 10/20/2020 10/15/2019 04/23/2019 09/30/2018 09/24/2017 09/18/2016 09/18/2015  PHQ - 2 Score 0 0 0 0 0 0 0  PHQ- 9 Score 0 - - - - - -    Fall Risk Fall Risk  10/20/2020 10/15/2019 04/23/2019 02/16/2019 02/04/2019  Falls in the past year? 0 0 0 0 0  Comment - - - Emmi Telephone Survey: data to providers prior to load -  Number falls in past yr: 0 - - - -  Injury with Fall? 0 - - - -  Risk for fall due to : Medication side effect - - - -  Follow up Falls evaluation completed;Falls prevention discussed - - - Falls evaluation completed    FALL RISK PREVENTION PERTAINING TO THE HOME:  Any stairs in or around the home? Yes  If so, are there any without handrails? No  Home free of loose throw rugs in walkways, pet beds, electrical cords, etc? Yes  Adequate lighting in your home to reduce risk of falls? Yes   ASSISTIVE DEVICES UTILIZED TO PREVENT FALLS:  Life alert? No  Use of a cane, walker or w/c? No  Grab bars in the bathroom? No  Shower chair or bench in shower? No  Elevated toilet seat or a handicapped toilet? No   TIMED UP AND GO:  Was the test performed?  N/A telephone visit .    Cognitive Function: MMSE - Mini Mental State Exam 10/20/2020 09/24/2017  Not completed: Refused -  Orientation to time - 5  Orientation to Place - 5  Registration - 3  Attention/ Calculation - 5  Recall - 3  Language- name 2 objects - 2  Language- repeat - 1  Language- follow 3 step command - 3  Language- read & follow  direction - 1  Write a sentence - 1  Copy design - 1  Total score - 30  Mini Cog  Mini-Cog screen was not completed. Patient refused. Maximum score is 22. A value of  0 denotes this part of the MMSE was not completed or the patient failed this part of the Mini-Cog screening.       Immunizations Immunization History  Administered Date(s) Administered   Fluad Quad(high Dose 65+) 12/22/2018, 12/28/2018, 12/18/2019   Influenza Split 12/19/2010, 12/18/2011   Influenza Whole 01/02/2007, 12/23/2007, 12/21/2008, 12/13/2009   Influenza,inj,Quad PF,6+ Mos 12/15/2012, 12/22/2013, 12/07/2014, 12/29/2015, 12/27/2016, 12/25/2017   PFIZER Comirnaty(Gray Top)Covid-19 Tri-Sucrose Vaccine 05/01/2019, 05/22/2019, 12/20/2019   Pneumococcal Conjugate-13 09/13/2013   Pneumococcal Polysaccharide-23 07/08/2012   Td 09/03/2005   Tdap 09/18/2016   Zoster, Live 05/01/2009    TDAP status: Up to date  Flu Vaccine status: Up to date  Pneumococcal vaccine status: Up to date  Covid-19 vaccine status: Completed 3 vaccines  Qualifies for Shingles Vaccine? Yes   Zostavax completed Yes   Shingrix Completed?: No.    Education has been provided regarding the importance of this vaccine. Patient has been advised to call insurance company to determine out of pocket expense if they have not yet received this vaccine. Advised may also receive vaccine at local pharmacy or Health Dept. Verbalized acceptance and understanding.  Screening Tests Health Maintenance  Topic Date Due   Zoster Vaccines- Shingrix (1 of 2) Never done   DEXA SCAN  Never done   COVID-19 Vaccine (4 - Booster for Pfizer series) 04/20/2020   INFLUENZA VACCINE  10/23/2020   MAMMOGRAM  04/13/2022   COLONOSCOPY (Pts 45-61yr Insurance coverage will need to be confirmed)  12/22/2024   TETANUS/TDAP  09/19/2026   Hepatitis C Screening  Completed   PNA vac Low Risk Adult  Completed   HPV VACCINES  Aged Out    Health Maintenance  Health Maintenance Due  Topic Date Due   Zoster Vaccines- Shingrix (1 of 2) Never done   DEXA SCAN  Never done   COVID-19 Vaccine (4 - Booster for Pfizer series) 04/20/2020     Colorectal cancer screening: Type of screening: Colonoscopy. Completed 12/23/2019. Repeat every 5 years  Mammogram status: Completed 04/13/2020. Repeat every year  Bone Density status: due, will discuss with provider at physical   Lung Cancer Screening: (Low Dose CT Chest recommended if Age 74-80years, 30 pack-year currently smoking OR have quit w/in 15 years.) does not qualify.   Additional Screening:  Hepatitis C Screening: does qualify; Completed 09/12/2016  Vision Screening: Recommended annual ophthalmology exams for early detection of glaucoma and other disorders of the eye. Is the patient up to date with their annual eye exam?  Yes  Who is the provider or what is the name of the [facility] which the patient [facility]ends annual [facility] exams[facility] provider, would they like to be referred to a provider to establish care? No .   Dental Screening: Recommended annual dental exams for proper oral hygiene  Community Resource Referral / Chronic Care Management: CRR required this visit?  No   CCM required this visit?  No      Plan:     I have personally reviewed and noted the following in the patient's chart:   Medical and social history Use of alcohol, tobacco or illicit drugs  Current medications and supplements including opioid prescriptions.  Functional ability and status Nutritional status  Physical activity Advanced directives List of other physicians Hospitalizations, surgeries, and ER visits in previous 12 months Vitals Screenings to include cognitive, depression, and falls Referrals and appointments  In addition, I have reviewed and discussed with patient certain preventive protocols, quality metrics, and best practice recommendations. A written personalized care plan for preventive services as well as general preventive health recommendations were provided to patient.   Due to this being a telephonic visit, the after visit  summary with patients personalized plan was offered to patient via office or my-chart. Patient preferred to pick up at office at next visit or via mychart.   Andrez Grime, LPN   [id number]

## 2020-10-20 NOTE — Patient Instructions (Signed)
[name] , Thank you for taking time to come for your Medicare Wellness Visit. I appreciate your ongoing commitment to your health goals. Please review the following plan we discussed and let me know if I can assist you in the future.   Screening recommendations/referrals: Colonoscopy: Up to date, completed 12/23/2019, due 11/2024 Mammogram: Up to date, completed 04/13/2020, due 03/2021 Bone Density: due, will discuss with provider at physical  Recommended yearly ophthalmology/optometry visit for glaucoma screening and checkup Recommended yearly dental visit for hygiene and checkup  Vaccinations: Influenza vaccine: Up to date, completed 12/18/2019, due 10/2020 Pneumococcal vaccine: Completed series Tdap vaccine: Up to date, completed 09/18/2016, due 08/2026 Shingles vaccine: due, check with your insurance regarding coverage if interested    Covid-19:completed 3 vaccines   Advanced directives: Please bring a copy of your POA (Power of Attorney) and/or Living Will to your next appointment.   Conditions/risks identified: hypertension, hypercholesterolemia   Next appointment: Follow up in one year for your annual wellness visit    Preventive Care 74 Years and Older, Female Preventive care refers to lifestyle choices and visits with your health care provider that can promote health and wellness. What does preventive care include? A yearly physical exam. This is also called an annual well check. Dental exams once or twice a year. Routine eye exams. Ask your health care provider how often you should have your eyes checked. Personal lifestyle choices, including: Daily care of your teeth and gums. Regular physical activity. Eating a healthy diet. Avoiding tobacco and drug use. Limiting alcohol use. Practicing safe sex. Taking low-dose aspirin every day. Taking vitamin and mineral supplements as recommended by your health care provider. What happens during an annual well check? The services  and screenings done by your health care provider during your annual well check will depend on your age, overall health, lifestyle risk factors, and family history of disease. Counseling  Your health care provider may ask you questions about your: Alcohol use. Tobacco use. Drug use. Emotional well-being. Home and relationship well-being. Sexual activity. Eating habits. History of falls. Memory and ability to understand (cognition). Work and work Statistician. Reproductive health. Screening  You may have the following tests or measurements: Height, weight, and BMI. Blood pressure. Lipid and cholesterol levels. These may be checked every 5 years, or more frequently if you are over 27 years old. Skin check. Lung cancer screening. You may have this screening every year starting at age 51 if you have a 30-pack-year history of smoking and currently smoke or have quit within the past 15 years. Fecal occult blood test (FOBT) of the stool. You may have this test every year starting at age 43. Flexible sigmoidoscopy or colonoscopy. You may have a sigmoidoscopy every 5 years or a colonoscopy every 10 years starting at age 24. Hepatitis C blood test. Hepatitis B blood test. Sexually transmitted disease (STD) testing. Diabetes screening. This is done by checking your blood sugar (glucose) after you have not eaten for a while (fasting). You may have this done every 1-3 years. Bone density scan. This is done to screen for osteoporosis. You may have this done starting at age 34. Mammogram. This may be done every 1-2 years. Talk to your health care provider about how often you should have regular mammograms. Talk with your health care provider about your test results, treatment options, and if necessary, the need for more tests. Vaccines  Your health care provider may recommend certain vaccines, such as: Influenza vaccine. This is recommended  every year. Tetanus, diphtheria, and acellular pertussis  (Tdap, Td) vaccine. You may need a Td booster every 10 years. Zoster vaccine. You may need this after age 16. Pneumococcal 13-valent conjugate (PCV13) vaccine. One dose is recommended after age 17. Pneumococcal polysaccharide (PPSV23) vaccine. One dose is recommended after age 47. Talk to your health care provider about which screenings and vaccines you need and how often you need them. This information is not intended to replace advice given to you by your health care provider. Make sure you discuss any questions you have with your health care provider. Document Released: 04/07/2015 Document Revised: 11/29/2015 Document Reviewed: 01/10/2015 Elsevier Interactive Patient Education  2017 [place] Prevention in the Home Falls can cause injuries. They can happen to people of all ages. There are many things you can do to make your home safe and to help prevent falls. What can I do on the outside of my home? Regularly fix the edges of walkways and driveways and fix any cracks. Remove anything that might make you trip as you walk through a door, such as a raised step or threshold. Trim any bushes or trees on the path to your home. Use bright outdoor lighting. Clear any walking paths of anything that might make someone trip, such as rocks or tools. Regularly check to see if handrails are loose or broken. Make sure that both sides of any steps have handrails. Any raised decks and porches should have guardrails on the edges. Have any leaves, snow, or ice cleared regularly. Use sand or salt on walking paths during winter. Clean up any spills in your garage right away. This includes oil or grease spills. What can I do in the bathroom? Use night lights. Install grab bars by the toilet and in the tub and shower. Do not use towel bars as grab bars. Use non-skid mats or decals in the tub or shower. If you need to sit down in the shower, use a plastic, non-slip stool. Keep the floor dry. Clean  up any water that spills on the floor as soon as it happens. Remove soap buildup in the tub or shower regularly. Attach bath mats securely with double-sided non-slip rug tape. Do not have throw rugs and other things on the floor that can make you trip. What can I do in the bedroom? Use night lights. Make sure that you have a light by your bed that is easy to reach. Do not use any sheets or blankets that are too big for your bed. They should not hang down onto the floor. Have a firm chair that has side arms. You can use this for support while you get dressed. Do not have throw rugs and other things on the floor that can make you trip. What can I do in the kitchen? Clean up any spills right away. Avoid walking on wet floors. Keep items that you use a lot in easy-to-reach places. If you need to reach something above you, use a strong step stool that has a grab bar. Keep electrical cords out of the way. Do not use floor polish or wax that makes floors slippery. If you must use wax, use non-skid floor wax. Do not have throw rugs and other things on the floor that can make you trip. What can I do with my stairs? Do not leave any items on the stairs. Make sure that there are handrails on both sides of the stairs and use them. Fix handrails that are broken  or loose. Make sure that handrails are as long as the stairways. Check any carpeting to make sure that it is firmly attached to the stairs. Fix any carpet that is loose or worn. Avoid having throw rugs at the top or bottom of the stairs. If you do have throw rugs, attach them to the floor with carpet tape. Make sure that you have a light switch at the top of the stairs and the bottom of the stairs. If you do not have them, ask someone to add them for you. What else can I do to help prevent falls? Wear shoes that: Do not have high heels. Have rubber bottoms. Are comfortable and fit you well. Are closed at the toe. Do not wear sandals. If you  use a stepladder: Make sure that it is fully opened. Do not climb a closed stepladder. Make sure that both sides of the stepladder are locked into place. Ask someone to hold it for you, if possible. Clearly mark and make sure that you can see: Any grab bars or handrails. First and last steps. Where the edge of each step is. Use tools that help you move around (mobility aids) if they are needed. These include: Canes. Walkers. Scooters. Crutches. Turn on the lights when you go into a dark area. Replace any light bulbs as soon as they burn out. Set up your furniture so you have a clear path. Avoid moving your furniture around. If any of your floors are uneven, fix them. If there are any pets around you, be aware of where they are. Review your medicines with your doctor. Some medicines can make you feel dizzy. This can increase your chance of falling. Ask your doctor what other things that you can do to help prevent falls. This information is not intended to replace advice given to you by your health care provider. Make sure you discuss any questions you have with your health care provider. Document Released: 01/05/2009 Document Revised: 08/17/2015 Document Reviewed: 04/15/2014 Elsevier Interactive Patient Education  2017 [employer].

## 2020-10-25 ENCOUNTER — Encounter: Payer: Self-pay | Admitting: Family Medicine

## 2020-10-25 ENCOUNTER — Other Ambulatory Visit: Payer: Self-pay

## 2020-10-25 ENCOUNTER — Ambulatory Visit (INDEPENDENT_AMBULATORY_CARE_PROVIDER_SITE_OTHER): Payer: Medicare Other | Admitting: Family Medicine

## 2020-10-25 VITALS — BP 136/84 | HR 98 | Temp 97.7°F | Ht 62.25 in | Wt 287.0 lb

## 2020-10-25 DIAGNOSIS — E2839 Other primary ovarian failure: Secondary | ICD-10-CM

## 2020-10-25 DIAGNOSIS — I10 Essential (primary) hypertension: Secondary | ICD-10-CM

## 2020-10-25 DIAGNOSIS — E7800 Pure hypercholesterolemia, unspecified: Secondary | ICD-10-CM

## 2020-10-25 DIAGNOSIS — R7303 Prediabetes: Secondary | ICD-10-CM | POA: Diagnosis not present

## 2020-10-25 LAB — LIPID PANEL
Cholesterol: 193 mg/dL (ref 0–200)
HDL: 42.2 mg/dL (ref >39.00)
NonHDL: 151.16
Total CHOL/HDL Ratio: 5
Triglycerides: 212 mg/dL — ABNORMAL HIGH (ref 0.0–149.0)
VLDL: 42.4 mg/dL — ABNORMAL HIGH (ref 0.0–40.0)

## 2020-10-25 LAB — COMPREHENSIVE METABOLIC PANEL
ALT: 17 U/L (ref 0–35)
AST: 21 U/L (ref 0–37)
Albumin: 4 g/dL (ref 3.5–5.2)
Alkaline Phosphatase: 53 U/L (ref 39–117)
BUN: 20 mg/dL (ref 6–23)
CO2: 30 mEq/L (ref 19–32)
Calcium: 10.2 mg/dL (ref 8.4–10.5)
Chloride: 97 mEq/L (ref 96–112)
Creatinine, Ser: 1.05 mg/dL (ref 0.40–1.20)
GFR: 52.42 mL/min — ABNORMAL LOW (ref >60.00)
Glucose, Bld: 116 mg/dL — ABNORMAL HIGH (ref 70–99)
Potassium: 3.4 mEq/L — ABNORMAL LOW (ref 3.5–5.1)
Sodium: 138 mEq/L (ref 135–145)
Total Bilirubin: 0.9 mg/dL (ref 0.2–1.2)
Total Protein: 6.9 g/dL (ref 6.0–8.3)

## 2020-10-25 LAB — LDL CHOLESTEROL, DIRECT: Direct LDL: 119 mg/dL

## 2020-10-25 LAB — HEMOGLOBIN A1C: Hgb A1c MFr Bld: 5.9 % (ref 4.6–6.5)

## 2020-10-25 NOTE — Patient Instructions (Addendum)
#  Bring a copy of your advanced Directive   Schedule your Dexa with your next Mammogram  Get the shingles vaccine from the pharmacy

## 2020-10-25 NOTE — Progress Notes (Signed)
Subjective:     [name] is a 75 y.o. female presenting for Annual Exam     HPI  #HTN - doing well  - on HCTZ - occasionally checking at home  #obesity - has gained some weight - eating out more  - wants to try and eat healthier - just started back at exercise - has been swimming again 1 hour twice weekly   Review of Systems  Eyes:  Negative for visual disturbance.  Respiratory:  Negative for shortness of breath and wheezing.   Cardiovascular:  Negative for chest pain and palpitations.  Neurological:  Negative for headaches.    Social History   Tobacco Use  Smoking Status Never  Smokeless Tobacco Never        Objective:    BP Readings from Last 3 Encounters:  10/25/20 136/84  04/27/20 118/70  12/23/19 114/65   Wt Readings from Last 3 Encounters:  10/25/20 287 lb (130.2 kg)  04/27/20 272 lb 4 oz (123.5 kg)  12/23/19 273 lb (123.8 kg)    BP 136/84   Pulse 98   Temp 97.7 F (36.5 C) (Temporal)   Ht 5' 2.25" (1.581 m)   Wt 287 lb (130.2 kg)   SpO2 95%   BMI 52.07 kg/m    Physical Exam Constitutional:      General: She is not in acute distress.    Appearance: She is well-developed. She is not diaphoretic.  HENT:     Right Ear: External ear normal.     Left Ear: External ear normal.  Eyes:     Conjunctiva/sclera: Conjunctivae normal.  Cardiovascular:     Rate and Rhythm: Normal rate and regular rhythm.  Pulmonary:     Effort: Pulmonary effort is normal. No respiratory distress.     Breath sounds: Normal breath sounds. No wheezing.  Musculoskeletal:     Cervical back: Neck supple.  Skin:    General: Skin is warm and dry.     Capillary Refill: Capillary refill takes less than 2 seconds.  Neurological:     Mental Status: She is alert. Mental status is at baseline.  Psychiatric:        Mood and Affect: Mood normal.        Behavior: Behavior normal.      Mini-Cog - 10/25/20 0900     Normal clock drawing test? yes    How  many words correct? 3              Lab Results  Component Value Date   CHOL 190 10/08/2019   HDL 45.50 10/08/2019   LDLCALC 109 (H) 10/08/2019   LDLDIRECT 151.3 03/15/2013   TRIG 180.0 (H) 10/08/2019   CHOLHDL 4 10/08/2019   The 10-year ASCVD risk score Mikey Bussing DC Jr., et al., 2013) is: 21.2%   Values used to calculate the score:     Age: 62 years     Sex: Female     Is Non-Hispanic African American: No     Diabetic: No     Tobacco smoker: No     Systolic Blood Pressure: [id number] mmHg     Is BP treated: Yes     HDL Cholesterol: 45.5 mg/dL     Total Cholesterol: 190 mg/dL      Assessment & Plan:   Problem List Items Addressed This Visit       Cardiovascular and Mediastinum   Essential hypertension - Primary    BP controlled. Cont hctz 25 mg  and ramipril 5 mg.        Relevant Orders   Comprehensive metabolic panel   Lipid panel     Other   PURE HYPERCHOLESTEROLEMIA    Discussed risk of 21% based on prior labs and benefit of statin. Will repeat today. At this time she will work on healthy diet and weight loss.        Relevant Orders   Lipid panel   OBESITY, MORBID    Weight gain in the setting of loss of husband. Encouraged healthy eating and regular exercise.        Relevant Orders   Comprehensive metabolic panel   Lipid panel   Pre-diabetes   Relevant Orders   Hemoglobin A1c   Other Visit Diagnoses     Estrogen deficiency       Relevant Orders   DG Bone Density        Return in about 1 year (around 10/25/2021) for annual.  Lesleigh Noe, MD  This visit occurred during the SARS-CoV-2 public health emergency.  Safety protocols were in place, including screening questions prior to the visit, additional usage of staff PPE, and extensive cleaning of exam room while observing appropriate contact time as indicated for disinfecting solutions.

## 2020-10-25 NOTE — Assessment & Plan Note (Signed)
BP controlled. Cont hctz 25 mg and ramipril 5 mg.

## 2020-10-25 NOTE — Assessment & Plan Note (Signed)
Weight gain in the setting of loss of husband. Encouraged healthy eating and regular exercise.

## 2020-10-25 NOTE — Assessment & Plan Note (Signed)
Discussed risk of 21% based on prior labs and benefit of statin. Will repeat today. At this time she will work on healthy diet and weight loss.

## 2020-10-26 ENCOUNTER — Other Ambulatory Visit: Payer: Self-pay | Admitting: Family Medicine

## 2020-10-26 ENCOUNTER — Other Ambulatory Visit: Payer: Self-pay | Admitting: *Deleted

## 2020-10-26 DIAGNOSIS — N1831 Chronic kidney disease, stage 3a: Secondary | ICD-10-CM

## 2020-10-26 DIAGNOSIS — E876 Hypokalemia: Secondary | ICD-10-CM

## 2020-10-26 MED ORDER — RAMIPRIL 5 MG PO CAPS: ORAL_CAPSULE | ORAL | 3 refills | Status: DC

## 2020-10-26 NOTE — Addendum Note (Signed)
Addended by: Ellamae Sia on: 10/26/2020 09:23 AM   Modules accepted: Orders

## 2020-11-18 ENCOUNTER — Other Ambulatory Visit: Payer: Self-pay | Admitting: Family Medicine

## 2020-11-28 ENCOUNTER — Other Ambulatory Visit: Payer: Self-pay

## 2020-11-28 ENCOUNTER — Other Ambulatory Visit (INDEPENDENT_AMBULATORY_CARE_PROVIDER_SITE_OTHER): Payer: Medicare Other

## 2020-11-28 DIAGNOSIS — E876 Hypokalemia: Secondary | ICD-10-CM | POA: Diagnosis not present

## 2020-11-28 DIAGNOSIS — N1831 Chronic kidney disease, stage 3a: Secondary | ICD-10-CM

## 2020-11-28 LAB — BASIC METABOLIC PANEL
BUN: 18 mg/dL (ref 6–23)
CO2: 31 mEq/L (ref 19–32)
Calcium: 9.7 mg/dL (ref 8.4–10.5)
Chloride: 101 mEq/L (ref 96–112)
Creatinine, Ser: 0.98 mg/dL (ref 0.40–1.20)
GFR: 56.9 mL/min — ABNORMAL LOW (ref >60.00)
Glucose, Bld: 106 mg/dL — ABNORMAL HIGH (ref 70–99)
Potassium: 3.9 mEq/L (ref 3.5–5.1)
Sodium: 140 mEq/L (ref 135–145)

## 2020-12-28 DIAGNOSIS — H524 Presbyopia: Secondary | ICD-10-CM | POA: Diagnosis not present

## 2020-12-28 DIAGNOSIS — H5213 Myopia, bilateral: Secondary | ICD-10-CM | POA: Diagnosis not present

## 2020-12-28 DIAGNOSIS — H35372 Puckering of macula, left eye: Secondary | ICD-10-CM | POA: Diagnosis not present

## 2020-12-28 DIAGNOSIS — H2513 Age-related nuclear cataract, bilateral: Secondary | ICD-10-CM | POA: Diagnosis not present

## 2020-12-28 DIAGNOSIS — H25013 Cortical age-related cataract, bilateral: Secondary | ICD-10-CM | POA: Diagnosis not present

## 2020-12-28 DIAGNOSIS — H353132 Nonexudative age-related macular degeneration, bilateral, intermediate dry stage: Secondary | ICD-10-CM | POA: Diagnosis not present

## 2020-12-29 ENCOUNTER — Other Ambulatory Visit: Payer: Self-pay

## 2020-12-29 ENCOUNTER — Ambulatory Visit (INDEPENDENT_AMBULATORY_CARE_PROVIDER_SITE_OTHER): Payer: Medicare Other

## 2020-12-29 DIAGNOSIS — Z23 Encounter for immunization: Secondary | ICD-10-CM

## 2021-03-01 ENCOUNTER — Other Ambulatory Visit: Payer: Self-pay | Admitting: Family Medicine

## 2021-03-01 DIAGNOSIS — Z1231 Encounter for screening mammogram for malignant neoplasm of breast: Secondary | ICD-10-CM

## 2021-04-16 ENCOUNTER — Ambulatory Visit
Admission: RE | Admit: 2021-04-16 | Discharge: 2021-04-16 | Disposition: A | Discharge status: Home / Self Care | Payer: Medicare Other | Source: Ambulatory Visit | Attending: Family Medicine | Admitting: Family Medicine

## 2021-04-16 DIAGNOSIS — Z1231 Encounter for screening mammogram for malignant neoplasm of breast: Secondary | ICD-10-CM | POA: Diagnosis not present

## 2021-10-30 ENCOUNTER — Ambulatory Visit (INDEPENDENT_AMBULATORY_CARE_PROVIDER_SITE_OTHER): Payer: Medicare Other | Admitting: Family Medicine

## 2021-10-30 ENCOUNTER — Encounter: Payer: Self-pay | Admitting: Family Medicine

## 2021-10-30 VITALS — BP 130/82 | HR 99 | Temp 97.0°F | Ht 62.25 in | Wt 290.0 lb

## 2021-10-30 DIAGNOSIS — E2839 Other primary ovarian failure: Secondary | ICD-10-CM

## 2021-10-30 DIAGNOSIS — E7800 Pure hypercholesterolemia, unspecified: Secondary | ICD-10-CM

## 2021-10-30 DIAGNOSIS — I10 Essential (primary) hypertension: Secondary | ICD-10-CM

## 2021-10-30 DIAGNOSIS — R7303 Prediabetes: Secondary | ICD-10-CM | POA: Diagnosis not present

## 2021-10-30 DIAGNOSIS — Z0000 Encounter for general adult medical examination without abnormal findings: Secondary | ICD-10-CM

## 2021-10-30 LAB — COMPREHENSIVE METABOLIC PANEL
ALT: 16 U/L (ref 0–35)
AST: 22 U/L (ref 0–37)
Albumin: 4.1 g/dL (ref 3.5–5.2)
Alkaline Phosphatase: 46 U/L (ref 39–117)
BUN: 19 mg/dL (ref 6–23)
CO2: 31 mEq/L (ref 19–32)
Calcium: 10.1 mg/dL (ref 8.4–10.5)
Chloride: 99 mEq/L (ref 96–112)
Creatinine, Ser: 1.03 mg/dL (ref 0.40–1.20)
GFR: 53.26 mL/min — ABNORMAL LOW (ref >60.00)
Glucose, Bld: 121 mg/dL — ABNORMAL HIGH (ref 70–99)
Potassium: 3.8 mEq/L (ref 3.5–5.1)
Sodium: 140 mEq/L (ref 135–145)
Total Bilirubin: 0.9 mg/dL (ref 0.2–1.2)
Total Protein: 6.8 g/dL (ref 6.0–8.3)

## 2021-10-30 LAB — CBC WITH DIFFERENTIAL/PLATELET
Basophils Absolute: 0.1 10*3/uL (ref 0.0–0.1)
Basophils Relative: 2.1 % (ref 0.0–3.0)
Eosinophils Absolute: 0.3 10*3/uL (ref 0.0–0.7)
Eosinophils Relative: 6.8 % — ABNORMAL HIGH (ref 0.0–5.0)
HCT: 42.7 % (ref 36.0–46.0)
Hemoglobin: 14.2 g/dL (ref 12.0–15.0)
Lymphocytes Relative: 30.7 % (ref 12.0–46.0)
Lymphs Abs: 1.4 10*3/uL (ref 0.7–4.0)
MCHC: 33.2 g/dL (ref 30.0–36.0)
MCV: 84.9 fl (ref 78.0–100.0)
Monocytes Absolute: 0.4 10*3/uL (ref 0.1–1.0)
Monocytes Relative: 9.6 % (ref 3.0–12.0)
Neutro Abs: 2.3 10*3/uL (ref 1.4–7.7)
Neutrophils Relative %: 50.8 % (ref 43.0–77.0)
Platelets: 203 10*3/uL (ref 150.0–400.0)
RBC: 5.04 Mil/uL (ref 3.87–5.11)
RDW: 13.3 % (ref 11.5–15.5)
WBC: 4.5 10*3/uL (ref 4.0–10.5)

## 2021-10-30 LAB — LDL CHOLESTEROL, DIRECT: Direct LDL: 127 mg/dL

## 2021-10-30 LAB — LIPID PANEL
Cholesterol: 195 mg/dL (ref 0–200)
HDL: 42.2 mg/dL (ref >39.00)
NonHDL: 152.98
Total CHOL/HDL Ratio: 5
Triglycerides: 205 mg/dL — ABNORMAL HIGH (ref 0.0–149.0)
VLDL: 41 mg/dL — ABNORMAL HIGH (ref 0.0–40.0)

## 2021-10-30 LAB — HEMOGLOBIN A1C: Hgb A1c MFr Bld: 6.2 % (ref 4.6–6.5)

## 2021-10-30 NOTE — Patient Instructions (Signed)
Schedule your dexa with your next mammogram  Try to work on healthy diet   Let me know if you want to see a nutritionist

## 2021-10-30 NOTE — Progress Notes (Signed)
Subjective:   [name] is a 76 y.o. female who presents for Medicare Annual (Subsequent) preventive examination.  Review of Systems    Review of Systems  Constitutional:  Negative for chills and fever.  HENT:  Negative for congestion and sore throat.   Eyes:  Negative for blurred vision and double vision.  Respiratory:  Negative for shortness of breath.   Cardiovascular:  Negative for chest pain.  Gastrointestinal:  Negative for heartburn, nausea and vomiting.  Genitourinary: Negative.   Musculoskeletal:  Positive for back pain. Negative for myalgias.  Skin:  Negative for rash.  Neurological:  Negative for dizziness and headaches.  Endo/Heme/Allergies:  Does not bruise/bleed easily.  Psychiatric/Behavioral:  Negative for depression. The patient is not nervous/anxious.     Cardiac Risk Factors include: advanced age (>44mn, >>61women);hypertension;sedentary lifestyle     Objective:    Today's Vitals   10/30/21 0815  BP: 130/82  Pulse: 99  Temp: (!) 97 F (36.1 C)  TempSrc: Temporal  SpO2: 97%  Weight: 290 lb (131.5 kg)  Height: 5' 2.25" (1.581 m)   Body mass index is 52.62 kg/m.     10/30/2021    8:31 AM 10/20/2020    8:53 AM 05/11/2019    8:56 AM 09/30/2018    8:16 AM 09/24/2017    8:00 AM 08/27/2013    9:44 AM 08/18/2013    8:52 AM  Advanced Directives  Does Patient Have a Medical Advance Directive? Yes Yes Yes Yes No Patient does not have advance directive Patient does not have advance directive  Type of [place]ive [place]     Does patient want to make changes to medical advance directive? No - Patient declined  No - Patient declined No - Patient declined     Copy of [place] Chart?  No - copy requested  No - copy requested     Would patient like information on creating a medical advance directive?      No - Patient declined    Pre-existing out of facility DNR order (yellow form or pink MOST form)      No No    Current Medications (verified) Outpatient Encounter Medications as of 10/30/2021  Medication Sig   Betamethasone Valerate 0.12 % foam Apply 1 application topically 2 (two) times daily as needed.   Calcium Carbonate-Vitamin D 600-400 MG-UNIT tablet Take 1 tablet by mouth daily.   hydrochlorothiazide (HYDRODIURIL) 25 MG tablet TAKE 1 TABLET (25 MG TOTAL) BY MOUTH DAILY.   Omega-3 Fatty Acids (FISH OIL PO) Take by mouth.   ramipril (ALTACE) 5 MG capsule TAKE 1 CAPSULE BY MOUTH EVERY DAY   No facility-administered encounter medications on file as of 10/30/2021.    Allergies (verified) Patient has no known allergies.   History: Past Medical History:  Diagnosis Date   Arthritis    "both knees" (07/07/2012)   Hypertension    on meds   PONV (postoperative nausea and vomiting)    Past Surgical History:  Procedure Laterality Date   COLONOSCOPY N/A 08/27/2013   Procedure: COLONOSCOPY;  Surgeon: DLafayette Dragon MD;  Location: [facility];  Service: Endoscopy;  Laterality: N/A;   JOINT REPLACEMENT     TOTAL KNEE ARTHROPLASTY Right 07/07/2012   TOTAL KNEE ARTHROPLASTY Right 07/07/2012   Procedure: TOTAL KNEE ARTHROPLASTY;  Surgeon: GMeredith Pel MD;  [facility]  Service:  Orthopedics;  Laterality: Right;  Right Total Knee Arthroplasty   TOTAL KNEE ARTHROPLASTY Left 10/13/2012   Procedure: TOTAL KNEE ARTHROPLASTY;  Surgeon: Meredith Pel, MD;  Location: [facility];  Service: Orthopedics;  Laterality: Left;   VAGINAL DELIVERY     X3   Family History  Problem Relation Age of Onset   Colon cancer Mother 92   Colon polyps Mother 98   Colon cancer Father 76   Colon polyps Father 29   Esophageal cancer Neg Hx    Rectal cancer Neg Hx    Stomach cancer Neg Hx    Breast cancer Neg Hx    Social History   Socioeconomic History   Marital status: Widowed    Spouse name: [name]    Number of children: 3   Years of education: high school   Highest education level: Not on file  Occupational History   Not on file  Tobacco Use   Smoking status: Never   Smokeless tobacco: Never  Vaping Use   Vaping Use: Never used  Substance and Sexual Activity   Alcohol use: No   Drug use: Yes   Sexual activity: Not Currently  Other Topics Concern   Not on file  Social History Narrative   Desires CPR.   Would want life support but not for prolonged period of time.   Does not want feeding tubes.   Has discussed this with her husband, [name].      04/27/20   From: the area   Living: with husband, [name] [phone])    Work: retired - Marine scientist      Family: 3 children - [name], [name], and [place] - 4 granddaughters      Enjoys: swimming      Exercise: not currently   Diet: not great      Safety   Seat belts: Yes    Guns: Yes  and secure   Safe in relationships: Yes    Social Determinants of Health   Financial Resource Strain: Low Risk  (10/20/2020)   Overall Financial Resource Strain (CARDIA)    Difficulty of Paying Living Expenses: Not hard at all  Food Insecurity: No Food Insecurity (10/20/2020)   Hunger Vital Sign    Worried About Running Out of Food in the Last Year: Never true    [place] in the Last Year: Never true  Transportation Needs: No Transportation Needs (10/20/2020)   PRAPARE - Hydrologist (Medical): No    Lack of Transportation (Non-Medical): No  Physical Activity: Insufficiently Active (10/20/2020)   Exercise Vital Sign    Days of Exercise per Week: 2 days    Minutes of Exercise per Session: 60 min  Stress: No Stress Concern Present (10/20/2020)   [place]    Feeling of Stress : Not at all  Social Connections: Not on file    Tobacco Counseling Counseling given: Not Answered   Clinical Intake:  Pre-visit preparation completed: No  Pain :  No/denies pain     BMI - recorded: 52.62 Nutritional Status: BMI > 30  Obese Nutritional Risks: None Diabetes: No  How often do you need to have someone help you when you read instructions, pamphlets, or other written materials from your doctor or pharmacy?: 1 - Never  Diabetic?no  Interpreter Needed?: No      Activities of Daily Living    10/30/2021    8:33 AM  In your present state of health, do you have any difficulty performing the following activities:  Hearing? 0  Vision? 0  Difficulty concentrating or making decisions? 0  Walking or climbing stairs? 0  Dressing or bathing? 0  Doing errands, shopping? 0  Preparing Food and eating ? N  Using the Toilet? N  In the past six months, have you accidently leaked urine? N  Do you have problems with loss of bowel control? N  Managing your Medications? N  Managing your Finances? N  Housekeeping or managing your Housekeeping? N    Patient Care Team: Lesleigh Noe, MD as PCP - General (Family Medicine) [name], MD (Inactive) as Consulting Physician (Gastroenterology) Marlou Sa Tonna Corner, MD as Consulting Physician (Orthopedic Surgery)  Indicate any recent Medical Services you may have received from other than [facility] providers in the past year (date may be approximate).     Assessment:   This is a routine wellness examination for [place].  Hearing/Vision screen Hearing Screening   '500Hz'$  '1000Hz'$  '2000Hz'$  '4000Hz'$   Right ear '20 20 20 20  '$ Left ear '20 20 20 20    '$ Dietary issues and exercise activities discussed: Current Exercise Habits: Home exercise routine, Type of exercise: Other - see comments (swimming), Time (Minutes): 60, Frequency (Times/Week): 2, Weekly Exercise (Minutes/Week): 120, Intensity: Mild, Exercise limited by: None identified   Goals Addressed             This Visit's Progress    Weight (lb) < 275 lb (124.7 kg)   290 lb (131.5 kg)    Continue to swim      Depression Screen    10/30/2021     8:21 AM 10/20/2020    8:54 AM 10/15/2019    9:33 AM 04/23/2019   11:58 AM 09/30/2018    8:17 AM 09/24/2017    8:01 AM 09/18/2016    8:25 AM  PHQ 2/9 Scores  PHQ - 2 Score 0 0 0 0 0 0 0  PHQ- 9 Score  0         Fall Risk    10/30/2021    8:21 AM 10/20/2020    8:54 AM 10/15/2019    9:33 AM 04/23/2019   11:58 AM 02/16/2019    1:20 PM  [place] in the past year? 0 0 0 0 0  Comment     Emmi Telephone Survey: data to providers prior to load  Number falls in past yr: 0 0     Injury with Fall? 0 0     Risk for fall due to : No Fall Risks Medication side effect     Follow up Falls evaluation completed Falls evaluation completed;Falls prevention discussed       FALL RISK PREVENTION PERTAINING TO THE HOME:  Any stairs in or around the home? No  If so, are there any without handrails?  Not any on the outside steps Home free of loose throw rugs in walkways, pet beds, electrical cords, etc? Yes  Adequate lighting in your home to reduce risk of falls? Yes   ASSISTIVE DEVICES UTILIZED TO PREVENT FALLS:  Life alert? No  Use of a cane, walker or w/c? No  Grab bars in the bathroom? No  Shower chair or bench in shower? No  Elevated toilet seat or a handicapped toilet? Yes   Cognitive Function:    10/20/2020    8:59 AM 09/24/2017    8:01 AM  MMSE - Mini Mental State  Exam  Not completed: Refused   Orientation to time  5  Orientation to Place  5  Registration  3  Attention/ Calculation  5  Recall  3  Language- name 2 objects  2  Language- repeat  1  Language- follow 3 step command  3  Language- read & follow direction  1  Write a sentence  1  Copy design  1  Total score  30        Mini-Cog - 10/30/21 0834     Normal clock drawing test? yes    How many words correct? 3              Immunizations Immunization History  Administered Date(s) Administered   Fluad Quad(high Dose 65+) 12/22/2018, 12/28/2018, 12/18/2019, 12/29/2020   Influenza Split 12/19/2010, 12/18/2011    Influenza Whole 01/02/2007, 12/23/2007, 12/21/2008, 12/13/2009   Influenza,inj,Quad PF,6+ Mos 12/15/2012, 12/22/2013, 12/07/2014, 12/29/2015, 12/27/2016, 12/25/2017   PFIZER Comirnaty(Gray Top)Covid-19 Tri-Sucrose Vaccine 05/01/2019, 05/22/2019, 12/20/2019   Pfizer Covid-19 Vaccine Bivalent Booster 82yr & up 01/31/2021   Pneumococcal Conjugate-13 09/13/2013   Pneumococcal Polysaccharide-23 07/08/2012   Td 09/03/2005   Tdap 09/18/2016   Zoster Recombinat (Shingrix) 04/19/2021, 08/09/2021   Zoster, Live 05/01/2009    TDAP status: Up to date  Flu Vaccine status: Up to date  Pneumococcal vaccine status: Up to date  Covid-19 vaccine status: Completed vaccines  Qualifies for Shingles Vaccine? Yes   Zostavax completed Yes   Shingrix Completed?: Yes  Screening Tests Health Maintenance  Topic Date Due   DEXA SCAN  Never done   COVID-19 Vaccine (5 - Pfizer series) 05/31/2021   INFLUENZA VACCINE  10/23/2021   COLONOSCOPY (Pts 45-444yrInsurance coverage will need to be confirmed)  12/22/2024   TETANUS/TDAP  09/19/2026   Pneumonia Vaccine 6517Years old  Completed   Hepatitis C Screening  Completed   Zoster Vaccines- Shingrix  Completed   HPV VACCINES  Aged Out    Health Maintenance  Health Maintenance Due  Topic Date Due   DEXA SCAN  Never done   COVID-19 Vaccine (5 - Pfizer series) 05/31/2021   INFLUENZA VACCINE  10/23/2021    Colorectal cancer screening: Type of screening: Colonoscopy. Completed 2021. Repeat every 5 years  Mammogram status: Completed 03/2021. Repeat every year  Bone Density status: Ordered today. Pt provided with contact info and advised to call to schedule appt.  Lung Cancer Screening: (Low Dose CT Chest recommended if Age 75-80ears, 30 pack-year currently smoking OR have quit w/in 15years.) does not qualify.   Lung Cancer Screening Referral: n/a  Additional Screening:  Hepatitis C Screening: does qualify; Completed 2018  Vision Screening:  Recommended annual ophthalmology exams for early detection of glaucoma and other disorders of the eye. Is the patient up to date with their annual eye exam?  Yes  Who is the provider or what is the name of the office in which the patient attends annual eye exams? Went in October If pt is not established with a provider, would they like to be referred to a provider to establish care?  N/a .   Dental Screening: Recommended annual dental exams for proper oral hygiene  Community Resource Referral / Chronic Care Management: CRR required this visit?  No   CCM required this visit?  No      Plan:     Problem List Items Addressed This Visit       Cardiovascular and Mediastinum   Essential hypertension   Relevant Orders  Comprehensive metabolic panel   CBC with Differential     Other   PURE HYPERCHOLESTEROLEMIA   Relevant Orders   Lipid panel   Pre-diabetes   Relevant Orders   Hemoglobin A1c   Other Visit Diagnoses     Encounter for Medicare annual wellness exam    -  Primary   Estrogen deficiency       Relevant Orders   DG Bone Density        I have personally reviewed and noted the following in the patient's chart:   Medical and social history Use of alcohol, tobacco or illicit drugs  Current medications and supplements including opioid prescriptions.  Functional ability and status Nutritional status Physical activity Advanced directives List of other physicians Hospitalizations, surgeries, and ER visits in previous 12 months Vitals Screenings to include cognitive, depression, and falls Referrals and appointments  In addition, I have reviewed and discussed with patient certain preventive protocols, quality metrics, and best practice recommendations. A written personalized care plan for preventive services as well as general preventive health recommendations were provided to patient.     Lesleigh Noe, MD   10/30/2021

## 2021-10-31 ENCOUNTER — Encounter: Payer: Self-pay | Admitting: Family Medicine

## 2021-10-31 DIAGNOSIS — E7800 Pure hypercholesterolemia, unspecified: Secondary | ICD-10-CM

## 2021-10-31 MED ORDER — ATORVASTATIN CALCIUM 10 MG PO TABS: 10.0000 mg | ORAL_TABLET | Freq: Every day | ORAL | 0 refills | Status: DC

## 2021-11-08 ENCOUNTER — Other Ambulatory Visit: Payer: Self-pay | Admitting: Family Medicine

## 2021-12-21 ENCOUNTER — Other Ambulatory Visit: Payer: Self-pay | Admitting: Family Medicine

## 2022-01-01 ENCOUNTER — Ambulatory Visit (INDEPENDENT_AMBULATORY_CARE_PROVIDER_SITE_OTHER): Payer: Medicare Other

## 2022-01-01 DIAGNOSIS — Z23 Encounter for immunization: Secondary | ICD-10-CM

## 2022-01-29 ENCOUNTER — Other Ambulatory Visit: Payer: Self-pay

## 2022-01-29 DIAGNOSIS — E7800 Pure hypercholesterolemia, unspecified: Secondary | ICD-10-CM

## 2022-01-29 MED ORDER — ATORVASTATIN CALCIUM 10 MG PO TABS: 10.0000 mg | ORAL_TABLET | Freq: Every day | ORAL | 0 refills | Status: DC

## 2022-02-05 DIAGNOSIS — H5213 Myopia, bilateral: Secondary | ICD-10-CM | POA: Diagnosis not present

## 2022-02-05 DIAGNOSIS — H353132 Nonexudative age-related macular degeneration, bilateral, intermediate dry stage: Secondary | ICD-10-CM | POA: Diagnosis not present

## 2022-02-05 DIAGNOSIS — H524 Presbyopia: Secondary | ICD-10-CM | POA: Diagnosis not present

## 2022-02-05 DIAGNOSIS — H25013 Cortical age-related cataract, bilateral: Secondary | ICD-10-CM | POA: Diagnosis not present

## 2022-02-05 DIAGNOSIS — H35372 Puckering of macula, left eye: Secondary | ICD-10-CM | POA: Diagnosis not present

## 2022-02-05 DIAGNOSIS — H2513 Age-related nuclear cataract, bilateral: Secondary | ICD-10-CM | POA: Diagnosis not present

## 2022-03-06 ENCOUNTER — Other Ambulatory Visit: Payer: Self-pay | Admitting: Family

## 2022-03-06 DIAGNOSIS — Z1231 Encounter for screening mammogram for malignant neoplasm of breast: Secondary | ICD-10-CM

## 2022-04-05 ENCOUNTER — Encounter: Payer: Medicare Other | Admitting: Family

## 2022-04-10 ENCOUNTER — Encounter: Payer: Self-pay | Admitting: Family

## 2022-04-10 ENCOUNTER — Ambulatory Visit (INDEPENDENT_AMBULATORY_CARE_PROVIDER_SITE_OTHER): Payer: Medicare Other | Admitting: Family

## 2022-04-10 VITALS — BP 136/84 | HR 77 | Temp 98.2°F | Ht 62.5 in | Wt 293.2 lb

## 2022-04-10 DIAGNOSIS — N1831 Chronic kidney disease, stage 3a: Secondary | ICD-10-CM | POA: Diagnosis not present

## 2022-04-10 DIAGNOSIS — Z79899 Other long term (current) drug therapy: Secondary | ICD-10-CM | POA: Diagnosis not present

## 2022-04-10 DIAGNOSIS — Z6841 Body Mass Index (BMI) 40.0 and over, adult: Secondary | ICD-10-CM

## 2022-04-10 DIAGNOSIS — R7303 Prediabetes: Secondary | ICD-10-CM | POA: Diagnosis not present

## 2022-04-10 DIAGNOSIS — I10 Essential (primary) hypertension: Secondary | ICD-10-CM

## 2022-04-10 DIAGNOSIS — I872 Venous insufficiency (chronic) (peripheral): Secondary | ICD-10-CM | POA: Diagnosis not present

## 2022-04-10 DIAGNOSIS — E7800 Pure hypercholesterolemia, unspecified: Secondary | ICD-10-CM

## 2022-04-10 DIAGNOSIS — L309 Dermatitis, unspecified: Secondary | ICD-10-CM

## 2022-04-10 LAB — COMPREHENSIVE METABOLIC PANEL
ALT: 14 U/L (ref 0–35)
AST: 23 U/L (ref 0–37)
Albumin: 4 g/dL (ref 3.5–5.2)
Alkaline Phosphatase: 51 U/L (ref 39–117)
BUN: 18 mg/dL (ref 6–23)
CO2: 32 mEq/L (ref 19–32)
Calcium: 9.7 mg/dL (ref 8.4–10.5)
Chloride: 100 mEq/L (ref 96–112)
Creatinine, Ser: 0.95 mg/dL (ref 0.40–1.20)
GFR: 58.5 mL/min — ABNORMAL LOW (ref >60.00)
Glucose, Bld: 111 mg/dL — ABNORMAL HIGH (ref 70–99)
Potassium: 3.6 mEq/L (ref 3.5–5.1)
Sodium: 139 mEq/L (ref 135–145)
Total Bilirubin: 0.8 mg/dL (ref 0.2–1.2)
Total Protein: 6.7 g/dL (ref 6.0–8.3)

## 2022-04-10 LAB — LIPID PANEL
Cholesterol: 183 mg/dL (ref 0–200)
HDL: 40.8 mg/dL (ref >39.00)
NonHDL: 141.74
Total CHOL/HDL Ratio: 4
Triglycerides: 215 mg/dL — ABNORMAL HIGH (ref 0.0–149.0)
VLDL: 43 mg/dL — ABNORMAL HIGH (ref 0.0–40.0)

## 2022-04-10 LAB — MICROALBUMIN / CREATININE URINE RATIO
Creatinine,U: 113.6 mg/dL
Microalb Creat Ratio: 8.7 mg/g (ref 0.0–30.0)
Microalb, Ur: 9.9 mg/dL — ABNORMAL HIGH (ref 0.0–1.9)

## 2022-04-10 LAB — HEMOGLOBIN A1C: Hgb A1c MFr Bld: 6.2 % (ref 4.6–6.5)

## 2022-04-10 LAB — LDL CHOLESTEROL, DIRECT: Direct LDL: 112 mg/dL

## 2022-04-10 NOTE — Assessment & Plan Note (Signed)
Patient advised to continue to work on diet and exercise as tolerated.

## 2022-04-10 NOTE — Assessment & Plan Note (Signed)
Continue wearing compression socks daily as well as work on a low-sodium diet.  Stable at current.  Can use betamethasone foam as needed

## 2022-04-10 NOTE — Patient Instructions (Addendum)
  I have sent an electronic order over to your preferred location for the following:  YOU CAN SCHEDULE THIS SAME TIME AS YOUR MAMMOGRAM IF THEY HAVE THE AVAILABILITY   '[]'$   2D Mammogram  '[]'$   3D Mammogram  '[x]'$   Bone Density   Please give this center a call to get scheduled at your convenience.   '[x]'$   [facility]      [place], [place]         Make sure to wear two piece  clothing  No lotions powders or deodorants the day of the appointment Make sure to bring picture ID and insurance card.  Bring list of medications you are currently taking including any supplements.    ------------------------------------ Welcome to our clinic, I am happy to have you as my new patient. I am excited to continue on this healthcare journey with you.  ------------------------------------  Stop by the lab prior to leaving today. I will notify you of your results once received.   Please keep in mind Any my chart messages you send have up to a three business day turnaround for a response.  Phone calls may take up to a one full business day turnaround for a  response.   If you need a medication refill I recommend you request it through the pharmacy as this is easiest for [place] rather than sending a message and or phone call.   Due to recent changes in healthcare laws, you may see results of your imaging and/or laboratory studies on MyChart before I have had a chance to review them.  I understand that in some cases there may be results that are confusing or concerning to you. Please understand that not all results are received at the same time and often I may need to interpret multiple results in order to provide you with the best plan of care or course of treatment. Therefore, I ask that you please give me 2 business days to thoroughly review all your results before contacting my office for clarification. Should we see a critical lab result, you  will be contacted sooner.   It was a pleasure seeing you today! Please do not hesitate to reach out with any questions and or concerns.  Regards,   Eugenia Pancoast FNP-C

## 2022-04-10 NOTE — Assessment & Plan Note (Addendum)
Pt advised of the following: Work on a diabetic diet, try to incorporate exercise at least 20-30 a day for 3 days a week or more.  A1c ordered today pending results. 

## 2022-04-10 NOTE — Assessment & Plan Note (Signed)
Patient advised to  continue ramipril 5 mg and hydrochlorothiazide 25 mg once daily. Periodically monitor your blood pressure if any chest pain palpitation shortness of breath seek more urgent care. Try to maintain a low-sodium diet

## 2022-04-10 NOTE — Assessment & Plan Note (Addendum)
Stable, however Likely reason for increased eosinophils.  Will continue to monitor

## 2022-04-10 NOTE — Progress Notes (Signed)
Established Patient Office Visit  Subjective:  Patient ID: [name], female    DOB: 1945-09-28  Age: 77 y.o. MRN: [id number]  CC:  Chief Complaint  Patient presents with   Establish Care    HPI [name] is here for a transition of care visit.  Prior provider was: Dr. Waunita Schooner  Pt is without acute concerns.   Mom and dad with colon cancer history, colonoscopy due again  Colonoscopy: 9/31/21 q5y due again in 2024-06-28 Mammogram: scheduled for mammogram 2/24 Dexa:   chronic concerns:  HTN: on ramipril 5 mg, pt reports typically <130/90  No cp palp or sob. Also with hctz 25 mg once daily.   Often with cellulitis so will use betamethasone prn wears compression socks.   Hld: was given lipitor 10 mg to try and then she ran out of RX. Didn't know if needed to renew.   Past Medical History:  Diagnosis Date   Arthritis    "both knees" (07/07/2012)   Hypertension    on meds   PONV (postoperative nausea and vomiting)     Past Surgical History:  Procedure Laterality Date   COLONOSCOPY N/A 08/27/2013   Procedure: COLONOSCOPY;  Surgeon: [name], MD;  Location: [facility];  Service: Endoscopy;  Laterality: N/A;   JOINT REPLACEMENT     TOTAL KNEE ARTHROPLASTY Right 07/07/2012   TOTAL KNEE ARTHROPLASTY Right 07/07/2012   Procedure: TOTAL KNEE ARTHROPLASTY;  Surgeon: Meredith Pel, MD;  Location: [place];  Service: Orthopedics;  Laterality: Right;  Right Total Knee Arthroplasty   TOTAL KNEE ARTHROPLASTY Left 10/13/2012   Procedure: TOTAL KNEE ARTHROPLASTY;  Surgeon: Meredith Pel, MD;  Location: [facility];  Service: Orthopedics;  Laterality: Left;   VAGINAL DELIVERY     X3    Family History  Problem Relation Age of Onset   Colon cancer Mother 27   Colon polyps Mother 3   Colon cancer Father 78   Colon polyps Father 40   Esophageal cancer Neg Hx    Rectal cancer Neg Hx    Stomach cancer Neg Hx    Breast cancer Neg Hx     Social History    Socioeconomic History   Marital status: Widowed    Spouse name: [name]   Number of children: 3   Years of education: high school   Highest education level: Not on file  Occupational History   Not on file  Tobacco Use   Smoking status: Never   Smokeless tobacco: Never  Vaping Use   Vaping Use: Never used  Substance and Sexual Activity   Alcohol use: No   Drug use: Yes   Sexual activity: Not Currently    Partners: Male  Other Topics Concern   Not on file  Social History Narrative   Desires CPR.   Would want life support but not for prolonged period of time.   Does not want feeding tubes.   Has discussed this with her husband, [name].      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      Family: 3 children - [name], [name], and Gibson - 4 granddaughters      Enjoys: swimming      Exercise: not currently   Diet: not great      Safety   Seat belts: Yes    Guns: Yes  and secure   Safe in relationships: Yes    Social Determinants of Health   Financial Resource Strain: Low Risk  (10/20/2020)   Overall Financial Resource Strain (CARDIA)    Difficulty of Paying Living Expenses: Not hard at all  Food Insecurity: No Food Insecurity (10/20/2020)   Hunger Vital Sign    Worried About Running Out of Food in the Last Year: Never true    Ran Out of Food in the Last Year: Never true  Transportation Needs: No Transportation Needs (10/20/2020)   PRAPARE - Hydrologist (Medical): No    Lack of Transportation (Non-Medical): No  Physical Activity: Insufficiently Active (10/20/2020)   Exercise Vital Sign    Days of Exercise per Week: 2 days    Minutes of Exercise per Session: 60 min  Stress: No Stress Concern Present (10/20/2020)   [place]    Feeling of Stress : Not at all  Social Connections: Not on file   Intimate Partner Violence: Not At Risk (10/20/2020)   Humiliation, Afraid, Rape, and Kick questionnaire    Fear of Current or Ex-Partner: No    Emotionally Abused: No    Physically Abused: No    Sexually Abused: No    Outpatient Medications Prior to Visit  Medication Sig Dispense Refill   Betamethasone Valerate 0.12 % foam Apply 1 application topically 2 (two) times daily as needed. 100 g 1   Calcium Carbonate-Vitamin D 600-400 MG-UNIT tablet Take 1 tablet by mouth daily.     hydrochlorothiazide (HYDRODIURIL) 25 MG tablet TAKE 1 TABLET (25 MG TOTAL) BY MOUTH DAILY. 90 tablet 3   Omega-3 Fatty Acids (FISH OIL PO) Take by mouth.     ramipril (ALTACE) 5 MG capsule TAKE 1 CAPSULE BY MOUTH EVERY DAY 90 capsule 3   atorvastatin (LIPITOR) 10 MG tablet Take 1 tablet (10 mg total) by mouth daily. (Patient not taking: Reported on 04/10/2022) 90 tablet 0   No facility-administered medications prior to visit.    No Known Allergies  ROS Review of Systems   Systems negative unless otherwise addressed in HPI    Objective:    Physical Exam Vitals reviewed.  Constitutional:      Appearance: Normal appearance. She is obese.  Eyes:     General:        Right eye: No discharge.        Left eye: No discharge.     Conjunctiva/sclera: Conjunctivae normal.  Cardiovascular:     Rate and Rhythm: Normal rate and regular rhythm.     Pulses:          Dorsalis pedis pulses are 2+ on the right side and 2+ on the left side.       Posterior tibial pulses are 2+ on the right side and 2+ on the left side.  Pulmonary:     Effort: Pulmonary effort is normal. No respiratory distress.     Breath sounds: Normal breath sounds.  Musculoskeletal:        General: Normal range of motion.     Cervical back: Normal range of motion.     Right lower leg: No edema.     Left lower leg: 1+ Edema present.  Neurological:     General: No focal deficit present.     Mental Status: She is alert and oriented to person,  place, and time. Mental status is at baseline.  Psychiatric:  Mood and Affect: Mood normal.        Behavior: Behavior normal.        Thought Content: Thought content normal.        Judgment: Judgment normal.      BP 136/84   Pulse 77   Temp 98.2 F (36.8 C) (Oral)   Ht 5' 2.5" (1.588 m)   Wt 293 lb 3.2 oz (133 kg)   SpO2 98%   BMI 52.77 kg/m  Wt Readings from Last 3 Encounters:  04/10/22 293 lb 3.2 oz (133 kg)  10/30/21 290 lb (131.5 kg)  10/25/20 287 lb (130.2 kg)     Health Maintenance Due  Topic Date Due   DEXA SCAN  Never done    There are no preventive care reminders to display for this patient.  Lab Results  Component Value Date   TSH 1.20 10/08/2019   Lab Results  Component Value Date   WBC 4.5 10/30/2021   HGB 14.2 10/30/2021   HCT 42.7 10/30/2021   MCV 84.9 10/30/2021   PLT 203.0 10/30/2021   Lab Results  Component Value Date   NA 140 10/30/2021   K 3.8 10/30/2021   CO2 31 10/30/2021   GLUCOSE 121 (H) 10/30/2021   BUN 19 10/30/2021   CREATININE 1.03 10/30/2021   BILITOT 0.9 10/30/2021   ALKPHOS 46 10/30/2021   AST 22 10/30/2021   ALT 16 10/30/2021   PROT 6.8 10/30/2021   ALBUMIN 4.1 10/30/2021   CALCIUM 10.1 10/30/2021   GFR 53.26 (L) 10/30/2021   Lab Results  Component Value Date   CHOL 195 10/30/2021   Lab Results  Component Value Date   HDL 42.20 10/30/2021   Lab Results  Component Value Date   LDLCALC 109 (H) 10/08/2019   Lab Results  Component Value Date   TRIG 205.0 (H) 10/30/2021   Lab Results  Component Value Date   CHOLHDL 5 10/30/2021   Lab Results  Component Value Date   HGBA1C 6.2 10/30/2021      Assessment & Plan:   Problem List Items Addressed This Visit       Cardiovascular and Mediastinum   Essential hypertension    Patient advised to  continue ramipril 5 mg and hydrochlorothiazide 25 mg once daily. Periodically monitor your blood pressure if any chest pain palpitation shortness of breath  seek more urgent care. Try to maintain a low-sodium diet      Relevant Orders   Microalbumin / creatinine urine ratio     Musculoskeletal and Integument   Eczema of both hands    Stable, however Likely reason for increased eosinophils.  Will continue to monitor      Chronic venous stasis dermatitis    Continue wearing compression socks daily as well as work on a low-sodium diet.  Stable at current.  Can use betamethasone foam as needed        Genitourinary   Stage 3a chronic kidney disease ([place]) - Primary    Stable CMP ordered for today pending results        Other   Pure hypercholesterolemia    Ordered lipid panel, pending results. Work on low cholesterol diet and exercise as tolerated Likely will restart Lipitor however pending results.      Relevant Orders   Lipid panel   Pre-diabetes    Pt advised of the following: Work on a diabetic diet, try to incorporate exercise at least 20-30 a day for 3 days a week or more.  A1c ordered today pending results       Relevant Orders   Hemoglobin A1c   Class 3 severe obesity due to excess calories with serious comorbidity and body mass index (BMI) of 50.0 to 59.9 in adult [facility])    Patient advised to continue to work on diet and exercise as tolerated.      Other Visit Diagnoses     On statin therapy       Relevant Orders   Comprehensive metabolic panel       No orders of the defined types were placed in this encounter.   Follow-up: Return in about 6 months (around 10/09/2022) for f/u cholesterol.    Eugenia Pancoast, FNP

## 2022-04-10 NOTE — Assessment & Plan Note (Signed)
Ordered lipid panel, pending results. Work on low cholesterol diet and exercise as tolerated Likely will restart Lipitor however pending results.

## 2022-04-10 NOTE — Assessment & Plan Note (Signed)
Stable CMP ordered for today pending results

## 2022-04-11 ENCOUNTER — Other Ambulatory Visit: Payer: Self-pay | Admitting: Family

## 2022-04-11 DIAGNOSIS — E7800 Pure hypercholesterolemia, unspecified: Secondary | ICD-10-CM

## 2022-04-11 MED ORDER — ATORVASTATIN CALCIUM 10 MG PO TABS: 10.0000 mg | ORAL_TABLET | Freq: Every day | ORAL | 3 refills | Status: DC

## 2022-05-01 ENCOUNTER — Ambulatory Visit
Admission: RE | Admit: 2022-05-01 | Discharge: 2022-05-01 | Disposition: A | Discharge status: Home / Self Care | Payer: Medicare Other | Source: Ambulatory Visit | Attending: Family | Admitting: Family

## 2022-05-01 DIAGNOSIS — Z1231 Encounter for screening mammogram for malignant neoplasm of breast: Secondary | ICD-10-CM

## 2022-07-11 ENCOUNTER — Ambulatory Visit (INDEPENDENT_AMBULATORY_CARE_PROVIDER_SITE_OTHER): Payer: Medicare Other | Admitting: Family

## 2022-07-11 ENCOUNTER — Encounter: Payer: Self-pay | Admitting: Family

## 2022-07-11 VITALS — BP 130/76 | HR 79 | Temp 97.6°F | Ht 62.5 in | Wt 295.6 lb

## 2022-07-11 DIAGNOSIS — N1831 Chronic kidney disease, stage 3a: Secondary | ICD-10-CM

## 2022-07-11 DIAGNOSIS — R7303 Prediabetes: Secondary | ICD-10-CM | POA: Diagnosis not present

## 2022-07-11 DIAGNOSIS — E7800 Pure hypercholesterolemia, unspecified: Secondary | ICD-10-CM | POA: Diagnosis not present

## 2022-07-11 DIAGNOSIS — R809 Proteinuria, unspecified: Secondary | ICD-10-CM

## 2022-07-11 LAB — LIPID PANEL
Cholesterol: 146 mg/dL (ref 0–200)
HDL: 51 mg/dL (ref >39.00)
LDL Cholesterol: 62 mg/dL (ref 0–99)
NonHDL: 95.39
Total CHOL/HDL Ratio: 3
Triglycerides: 166 mg/dL — ABNORMAL HIGH (ref 0.0–149.0)
VLDL: 33.2 mg/dL (ref 0.0–40.0)

## 2022-07-11 NOTE — Assessment & Plan Note (Signed)
Reviewed hga1c stable Pt advised of the following: Work on a diabetic diet, try to incorporate exercise at least 20-30 a day for 3 days a week or more.

## 2022-07-11 NOTE — Assessment & Plan Note (Signed)
stable °

## 2022-07-11 NOTE — Assessment & Plan Note (Signed)
Managed on ramipril

## 2022-07-11 NOTE — Assessment & Plan Note (Signed)
Continue lipitor 10 mg nightly.  Ordered lipid panel, pending results. Work on low cholesterol diet and exercise as tolerated

## 2022-07-11 NOTE — Progress Notes (Signed)
Established Patient Office Visit  Subjective:      CC:  Chief Complaint  Patient presents with   Medical Management of Chronic Issues    HPI: [name] is a 77 y.o. female presenting on 07/11/2022 for Medical Management of Chronic Issues . Hyperlipidemia: has since restarted the lipitor 10 mg nightly and tolerating well. Denies myalgias.  Lipid Panel     Component Value Date/Time   CHOL 183 04/10/2022 1044   TRIG 215.0 (H) 04/10/2022 1044   HDL 40.80 04/10/2022 1044   CHOLHDL 4 04/10/2022 1044   VLDL 43.0 (H) 04/10/2022 1044   LDLCALC 109 (H) 10/08/2019 0808   LDLDIRECT 112.0 04/10/2022 1044   Exercise: she is 'commercial' walking so when she is watching tv she walks around her house on the commercials. She lives alone so doesn't cook that much so eats here and there, doesn't eat out often but not always the best well rounded meals. She drinks sodas on occasion.    Positive microalbumin: on ramipril. She does also have CKD   CKD, stable.  Last metabolic panel Lab Results  Component Value Date   GLUCOSE 111 (H) 04/10/2022   NA 139 04/10/2022   K 3.6 04/10/2022   CL 100 04/10/2022   CO2 32 04/10/2022   BUN 18 04/10/2022   CREATININE 0.95 04/10/2022   GFRNONAA 52 (L) 10/14/2012   CALCIUM 9.7 04/10/2022   PROT 6.7 04/10/2022   ALBUMIN 4.0 04/10/2022   BILITOT 0.8 04/10/2022   ALKPHOS 51 04/10/2022   AST 23 04/10/2022   ALT 14 04/10/2022         Social history:  Relevant past medical, surgical, family and social history reviewed and updated as indicated. Interim medical history since our last visit reviewed.  Allergies and medications reviewed and updated.  DATA REVIEWED: CHART IN EPIC     ROS: Negative unless specifically indicated above in HPI.    Current Outpatient Medications:    atorvastatin (LIPITOR) 10 MG tablet, Take 1 tablet (10 mg total) by mouth daily., Disp: 90 tablet, Rfl: 3   Betamethasone Valerate 0.12 % foam, Apply 1  application topically 2 (two) times daily as needed., Disp: 100 g, Rfl: 1   Calcium Carbonate-Vitamin D 600-400 MG-UNIT tablet, Take 1 tablet by mouth daily., Disp: , Rfl:    hydrochlorothiazide (HYDRODIURIL) 25 MG tablet, TAKE 1 TABLET (25 MG TOTAL) BY MOUTH DAILY., Disp: 90 tablet, Rfl: 3   Omega-3 Fatty Acids (FISH OIL PO), Take by mouth., Disp: , Rfl:    ramipril (ALTACE) 5 MG capsule, TAKE 1 CAPSULE BY MOUTH EVERY DAY, Disp: 90 capsule, Rfl: 3      Objective:    BP 130/76 (BP Location: Left Arm)   Pulse 79   Temp 97.6 F (36.4 C) (Temporal)   Ht 5' 2.5" (1.588 m)   Wt 295 lb 9.6 oz (134.1 kg)   SpO2 97%   BMI 53.20 kg/m   Wt Readings from Last 3 Encounters:  07/11/22 295 lb 9.6 oz (134.1 kg)  04/10/22 293 lb 3.2 oz (133 kg)  10/30/21 290 lb (131.5 kg)    Physical Exam Constitutional:      General: She is not in acute distress.    Appearance: Normal appearance. She is obese. She is not ill-appearing, toxic-appearing or diaphoretic.  HENT:     Head: Normocephalic.  Cardiovascular:     Rate and Rhythm: Normal rate and regular rhythm.  Pulmonary:     Effort: Pulmonary effort  is normal.  Musculoskeletal:        General: Normal range of motion.  Neurological:     General: No focal deficit present.     Mental Status: She is alert and oriented to person, place, and time. Mental status is at baseline.  Psychiatric:        Mood and Affect: Mood normal.        Behavior: Behavior normal.        Thought Content: Thought content normal.        Judgment: Judgment normal.           Assessment & Plan:  Stage 3a chronic kidney disease Assessment & Plan: stable   Pure hypercholesterolemia Assessment & Plan: Continue lipitor 10 mg nightly.  Ordered lipid panel, pending results. Work on low cholesterol diet and exercise as tolerated   Orders: -     Lipid panel  Pre-diabetes Assessment & Plan: Reviewed hga1c stable Pt advised of the following: Work on a diabetic  diet, try to incorporate exercise at least 20-30 a day for 3 days a week or more.     Microalbuminuria Assessment & Plan: Managed on ramipril      Return in about 6 months (around 01/10/2023) for f/u cholesterol, f/u CPE.  Mort Sawyers, MSN, APRN, FNP-C [facility]

## 2022-07-11 NOTE — Patient Instructions (Signed)
  Stop by the lab prior to leaving today. I will notify you of your results once received.    Regards,   Barney Gertsch FNP-C  

## 2022-08-24 IMAGING — MG MM DIGITAL SCREENING BILAT W/ TOMO AND CAD
6 of 10 series · 6 of 30 positions shown · non-contrast
Comparison: Previous exam(s).

ACR Breast Density Category a: The breast tissue is almost entirely
fatty.

CLINICAL DATA: Screening.

EXAM:
DIGITAL SCREENING BILATERAL MAMMOGRAM WITH TOMOSYNTHESIS AND CAD
TECHNIQUE: Bilateral screening digital craniocaudal and mediolateral oblique
mammograms were obtained. Bilateral screening digital breast
tomosynthesis was performed. The images were evaluated with
computer-aided detection.

[L MLO synth-2D]
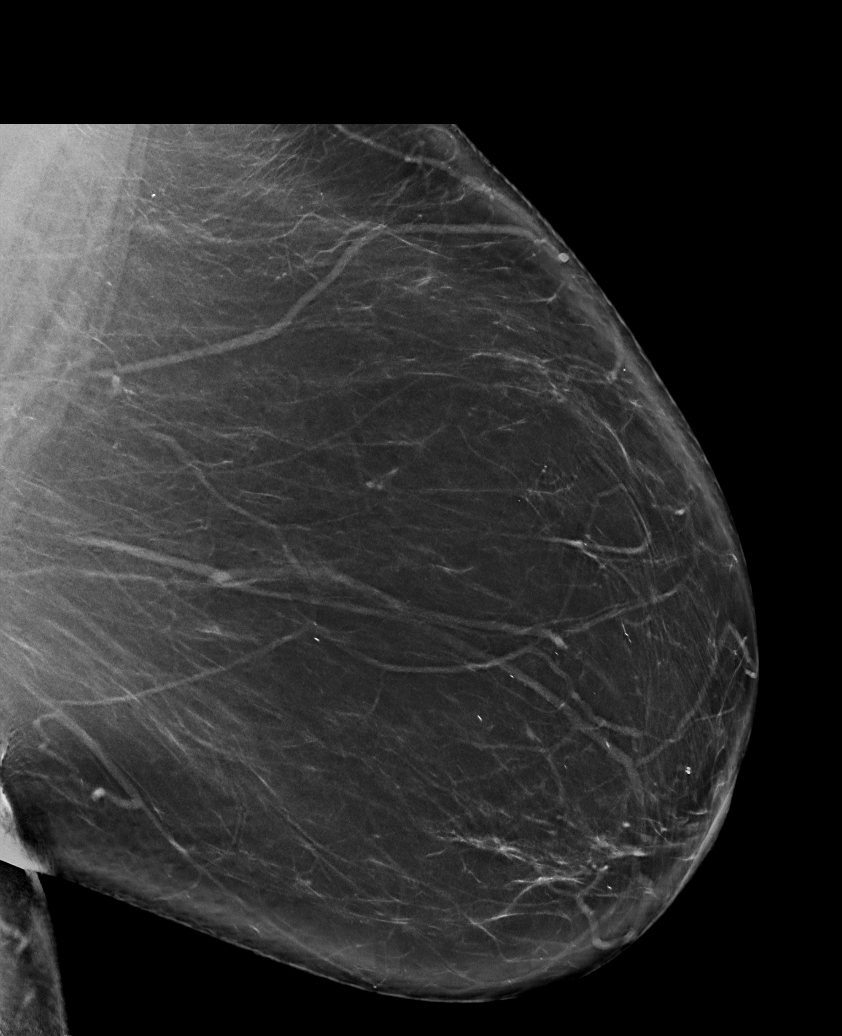

[R CC synth-2D]
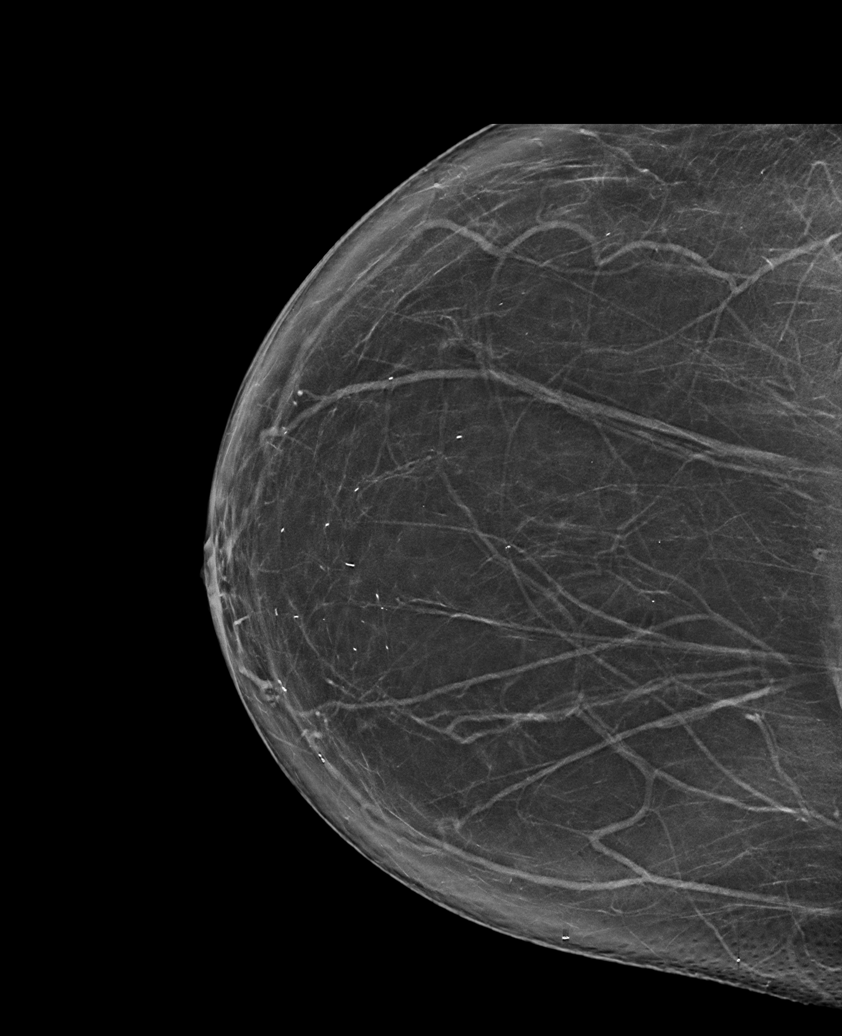

[R MLO synth-2D]
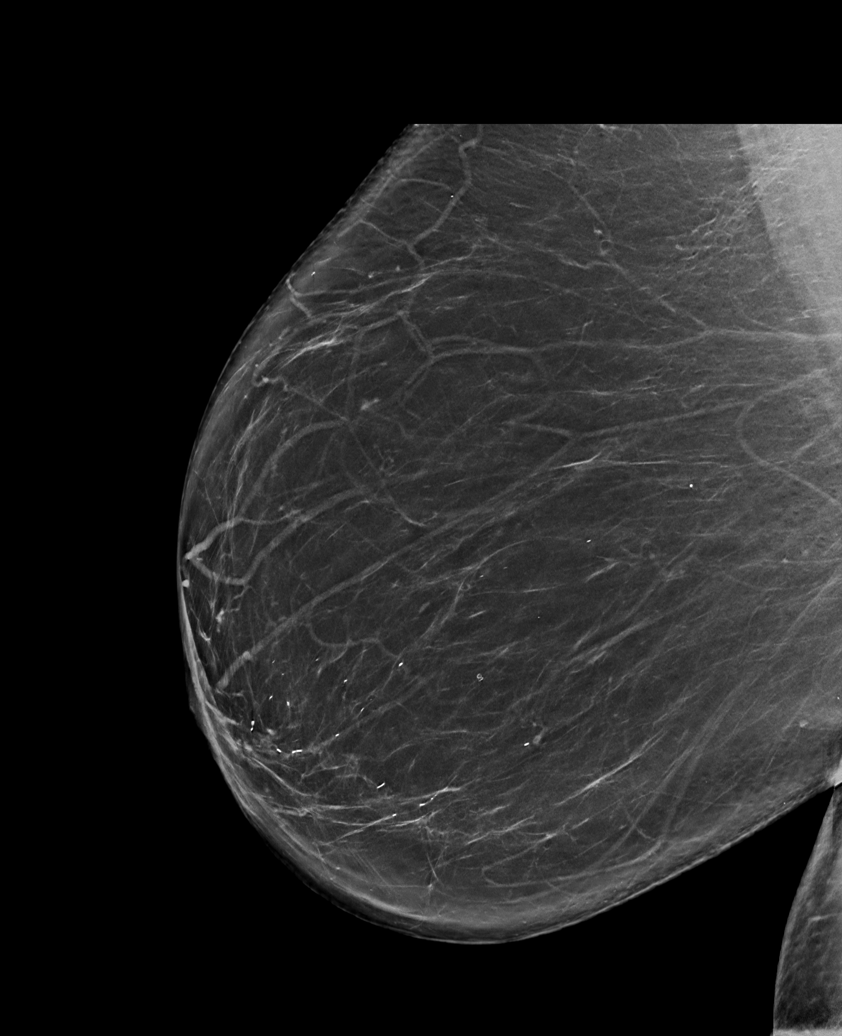

[R CV synth-2D]
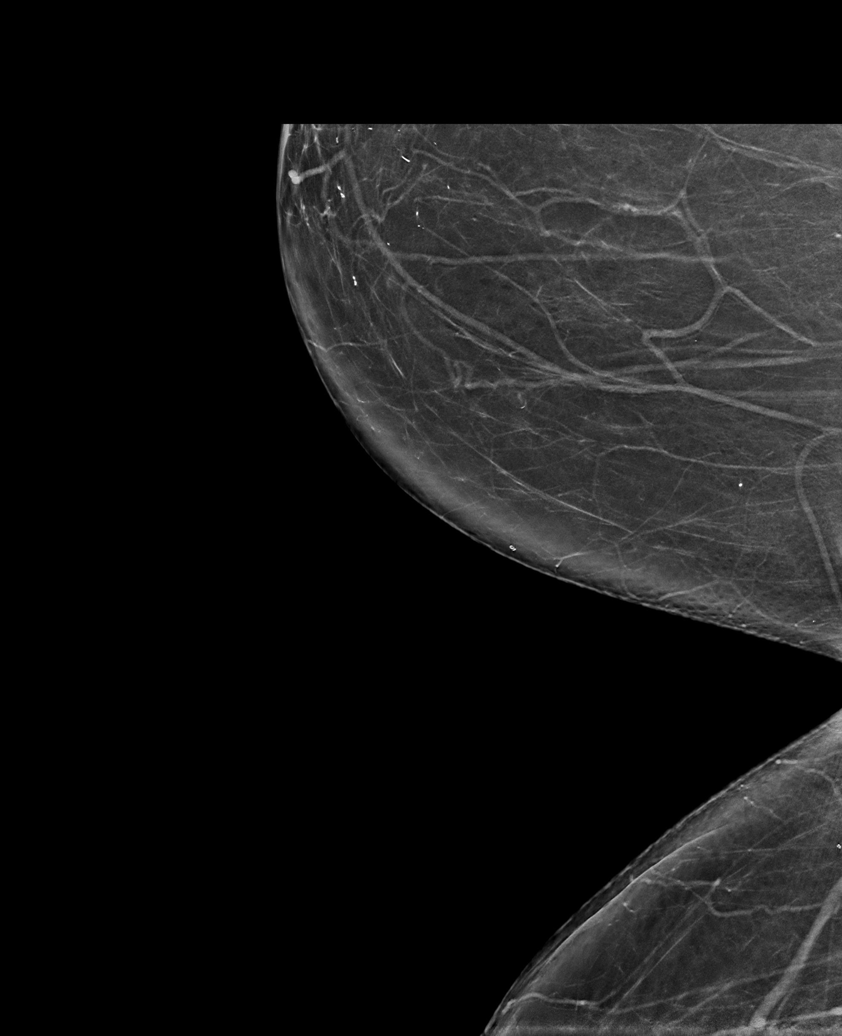

[L CC synth-2D]
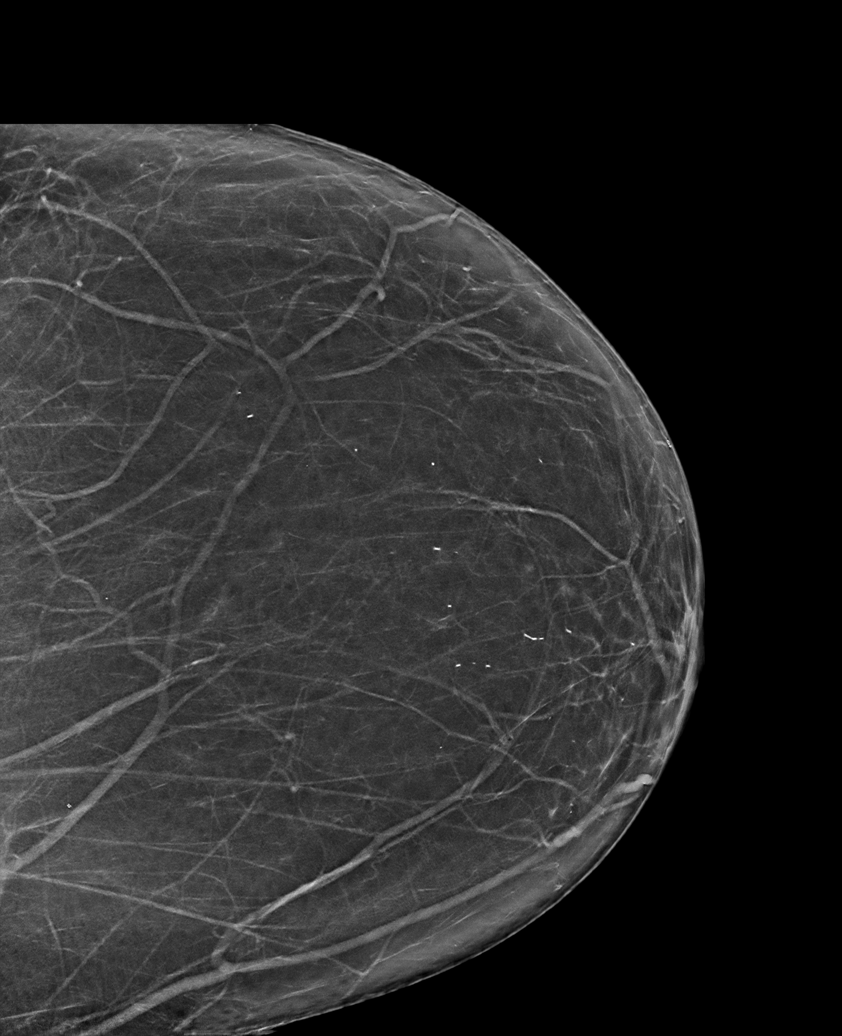

[L CC tomo · tomo slice 41/81.0]
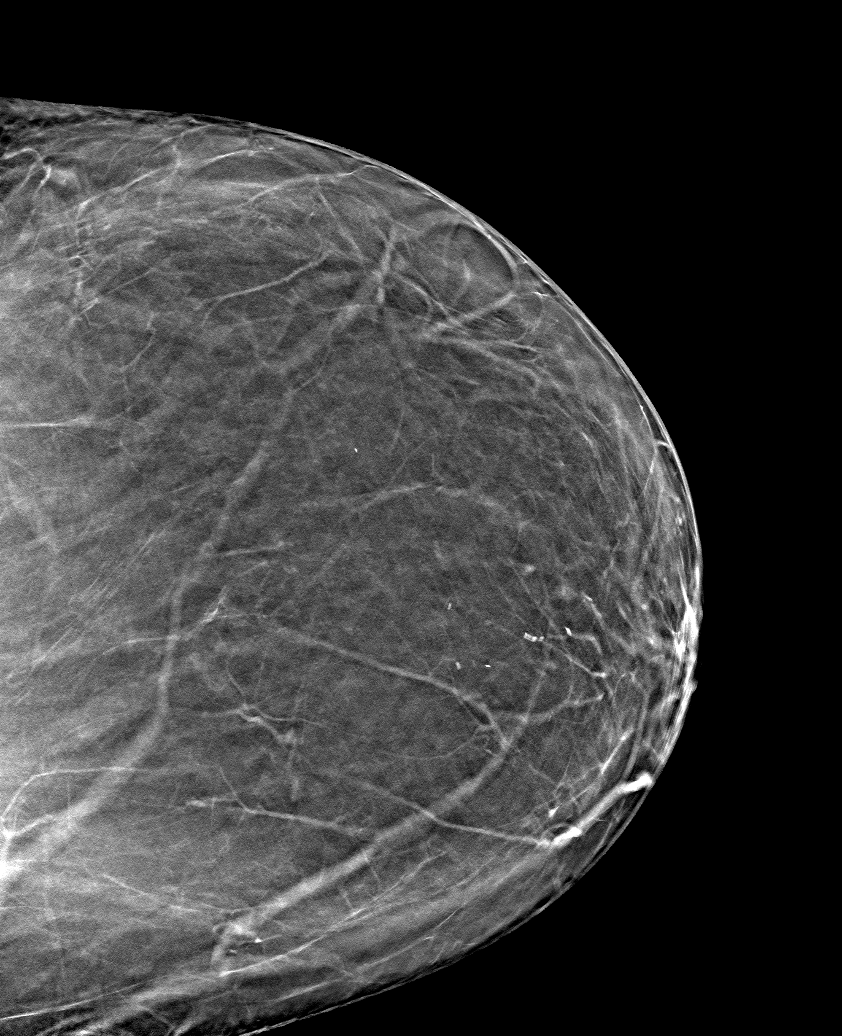

[6 of 30 positions shown; findings below may reference images not displayed]

FINDINGS: There are no findings suspicious for malignancy.
IMPRESSION: No mammographic evidence of malignancy. A result letter of this
screening mammogram will be mailed directly to the patient.

RECOMMENDATION:
Screening mammogram in one year. (Code:0E-3-N98)

BI-RADS CATEGORY  1: Negative.

## 2022-10-31 ENCOUNTER — Other Ambulatory Visit: Payer: Self-pay

## 2022-10-31 DIAGNOSIS — I10 Essential (primary) hypertension: Secondary | ICD-10-CM

## 2022-10-31 MED ORDER — RAMIPRIL 5 MG PO CAPS: ORAL_CAPSULE | ORAL | 3 refills | Status: DC

## 2022-10-31 NOTE — Telephone Encounter (Signed)
Given last by Dr. Selena Batten ok to refill

## 2022-11-04 ENCOUNTER — Encounter: Payer: Self-pay | Admitting: Family

## 2022-11-04 ENCOUNTER — Ambulatory Visit (INDEPENDENT_AMBULATORY_CARE_PROVIDER_SITE_OTHER): Payer: Medicare Other | Admitting: Family

## 2022-11-04 VITALS — BP 130/78 | HR 98 | Temp 98.1°F | Ht 62.5 in | Wt 294.0 lb

## 2022-11-04 DIAGNOSIS — Z1231 Encounter for screening mammogram for malignant neoplasm of breast: Secondary | ICD-10-CM

## 2022-11-04 DIAGNOSIS — N1831 Chronic kidney disease, stage 3a: Secondary | ICD-10-CM | POA: Diagnosis not present

## 2022-11-04 DIAGNOSIS — Z6841 Body Mass Index (BMI) 40.0 and over, adult: Secondary | ICD-10-CM

## 2022-11-04 DIAGNOSIS — R7303 Prediabetes: Secondary | ICD-10-CM | POA: Diagnosis not present

## 2022-11-04 DIAGNOSIS — Z780 Asymptomatic menopausal state: Secondary | ICD-10-CM | POA: Diagnosis not present

## 2022-11-04 DIAGNOSIS — E7800 Pure hypercholesterolemia, unspecified: Secondary | ICD-10-CM

## 2022-11-04 DIAGNOSIS — R809 Proteinuria, unspecified: Secondary | ICD-10-CM

## 2022-11-04 DIAGNOSIS — I10 Essential (primary) hypertension: Secondary | ICD-10-CM | POA: Diagnosis not present

## 2022-11-04 DIAGNOSIS — Z0001 Encounter for general adult medical examination with abnormal findings: Secondary | ICD-10-CM | POA: Insufficient documentation

## 2022-11-04 LAB — COMPREHENSIVE METABOLIC PANEL
ALT: 16 U/L (ref 0–35)
AST: 23 U/L (ref 0–37)
Albumin: 4 g/dL (ref 3.5–5.2)
Alkaline Phosphatase: 47 U/L (ref 39–117)
BUN: 22 mg/dL (ref 6–23)
CO2: 33 mEq/L — ABNORMAL HIGH (ref 19–32)
Calcium: 10.1 mg/dL (ref 8.4–10.5)
Chloride: 97 mEq/L (ref 96–112)
Creatinine, Ser: 1.13 mg/dL (ref 0.40–1.20)
GFR: 47.32 mL/min — ABNORMAL LOW (ref >60.00)
Glucose, Bld: 118 mg/dL — ABNORMAL HIGH (ref 70–99)
Potassium: 3.5 mEq/L (ref 3.5–5.1)
Sodium: 138 mEq/L (ref 135–145)
Total Bilirubin: 1.2 mg/dL (ref 0.2–1.2)
Total Protein: 6.4 g/dL (ref 6.0–8.3)

## 2022-11-04 LAB — HEMOGLOBIN A1C: Hgb A1c MFr Bld: 6.2 % (ref 4.6–6.5)

## 2022-11-04 LAB — LIPID PANEL
Cholesterol: 132 mg/dL (ref 0–200)
HDL: 43.2 mg/dL (ref >39.00)
LDL Cholesterol: 57 mg/dL (ref 0–99)
NonHDL: 89.16
Total CHOL/HDL Ratio: 3
Triglycerides: 162 mg/dL — ABNORMAL HIGH (ref 0.0–149.0)
VLDL: 32.4 mg/dL (ref 0.0–40.0)

## 2022-11-04 NOTE — Assessment & Plan Note (Signed)
Continue ramipril 5 mg once daily

## 2022-11-04 NOTE — Assessment & Plan Note (Signed)
Pt advised of the following: Work on a diabetic diet, try to incorporate exercise at least 20-30 a day for 3 days a week or more.   

## 2022-11-04 NOTE — Progress Notes (Signed)
Subjective:  Patient ID: [name], female    DOB: September 14, 1945  Age: 77 y.o. MRN: [id number]  Patient Care Team: Mort Sawyers, FNP as PCP - General (Family Medicine) Hart Carwin, MD (Inactive) as Consulting Physician (Gastroenterology) August Saucer Corrie Mckusick, MD as Consulting Physician (Orthopedic Surgery)   CC:  Chief Complaint  Patient presents with   Annual Exam    Fasting     HPI [name] is a 77 y.o. female who presents today for f/u of chronic conditions.   She reports consuming a general diet.  Walks during commercials  She generally feels well. She reports sleeping well. She does not have additional problems to discuss today.   Vision:Within last year Dental:Receives regular dental care STD:The patient denies history of sexually transmitted disease.   Mammogram: 05/01/22 Colonoscopy: 12/23/19, Q5Y Bone density scan: has never had  Pt is without acute concerns.   Advanced Directives Patient does have advanced directives, pt is planning to bring a copy with her.  DEPRESSION SCREENING    07/11/2022    8:20 AM 04/10/2022   10:15 AM 10/30/2021    8:21 AM 10/20/2020    8:54 AM 10/15/2019    9:33 AM 04/23/2019   11:58 AM 09/30/2018    8:17 AM  PHQ 2/9 Scores  PHQ - 2 Score  0 0 0 0 0 0  PHQ- 9 Score  0  0     Exception Documentation Patient refusal           ROS: Negative unless specifically indicated above in HPI.    Current Outpatient Medications:    atorvastatin (LIPITOR) 10 MG tablet, Take 1 tablet (10 mg total) by mouth daily., Disp: 90 tablet, Rfl: 3   Betamethasone Valerate 0.12 % foam, Apply 1 application topically 2 (two) times daily as needed., Disp: 100 g, Rfl: 1   Calcium Carbonate-Vitamin D 600-400 MG-UNIT tablet, Take 1 tablet by mouth daily., Disp: , Rfl:    hydrochlorothiazide (HYDRODIURIL) 25 MG tablet, TAKE 1 TABLET (25 MG TOTAL) BY MOUTH DAILY., Disp: 90 tablet, Rfl: 3   Omega-3 Fatty Acids (FISH OIL PO), Take by mouth.,  Disp: , Rfl:    ramipril (ALTACE) 5 MG capsule, TAKE 1 CAPSULE BY MOUTH EVERY DAY, Disp: 90 capsule, Rfl: 3    Objective:    BP 130/78   Pulse 98   Temp 98.1 F (36.7 C) (Oral)   Ht 5' 2.5" (1.588 m)   Wt 294 lb (133.4 kg)   SpO2 95%   BMI 52.92 kg/m   BP Readings from Last 3 Encounters:  11/04/22 130/78  07/11/22 130/76  04/10/22 136/84      Physical Exam Constitutional:      General: She is not in acute distress.    Appearance: Normal appearance. She is obese. She is not ill-appearing.  HENT:     Head: Normocephalic.     Right Ear: Tympanic membrane normal.     Left Ear: Tympanic membrane normal.     Nose: Nose normal.     Mouth/Throat:     Mouth: Mucous membranes are moist.  Eyes:     Extraocular Movements: Extraocular movements intact.     Pupils: Pupils are equal, round, and reactive to light.  Cardiovascular:     Rate and Rhythm: Normal rate and regular rhythm.  Pulmonary:     Effort: Pulmonary effort is normal.     Breath sounds: Normal breath sounds.  Abdominal:     General:  Abdomen is flat. Bowel sounds are normal.     Palpations: Abdomen is soft.     Tenderness: There is no guarding or rebound.  Musculoskeletal:        General: Normal range of motion.     Cervical back: Normal range of motion.  Skin:    General: Skin is warm.     Capillary Refill: Capillary refill takes less than 2 seconds.  Neurological:     General: No focal deficit present.     Mental Status: She is alert.  Psychiatric:        Mood and Affect: Mood normal.        Behavior: Behavior normal.        Thought Content: Thought content normal.        Judgment: Judgment normal.          Assessment & Plan:  Microalbuminuria Assessment & Plan: Continue ramipril 5 mg once daily    Stage 3a chronic kidney disease ([facility]) -     Comprehensive metabolic panel  Pre-diabetes Assessment & Plan: Pt advised of the following: Work on a diabetic diet, try to incorporate exercise at  least 20-30 a day for 3 days a week or more.    Orders: -     Hemoglobin A1c  Pure hypercholesterolemia Assessment & Plan: Ordered lipid panel, pending results. Work on low cholesterol diet and exercise as tolerated   Orders: -     Lipid panel  Postmenopausal -     DG Bone Density; Future  Screening mammogram for breast cancer -     3D Screening Mammogram, Left and Right; Future  Class 3 severe obesity due to excess calories with serious comorbidity and body mass index (BMI) of 50.0 to 59.9 in adult [facility]) Assessment & Plan: Pt advised to work on diet and exercise as tolerated    Essential hypertension Assessment & Plan: Stable Continue ramipril 5 mg once daily    Encounter for general adult medical examination with abnormal findings Assessment & Plan: Patient Counseling(The following topics were reviewed):  Preventative care handout given to pt  Health maintenance and immunizations reviewed. Please refer to Health maintenance section. Pt advised on safe sex, wearing seatbelts in car, and proper nutrition labwork ordered today for annual Dental health: Discussed importance of regular tooth brushing, flossing, and dental visits.        Follow-up: Return in about 6 months (around 05/07/2023) for f/u diabetes, f/u cholesterol.   Mort Sawyers, FNP

## 2022-11-04 NOTE — Assessment & Plan Note (Signed)

## 2022-11-04 NOTE — Patient Instructions (Signed)
  Stop by the lab prior to leaving today. I will notify you of your results once received.   Recommendations on keeping yourself healthy:  - Exercise at least 30-45 minutes a day, 3-4 days a week.  - Eat a low-fat diet with lots of fruits and vegetables, up to 7-9 servings per day.  - Seatbelts can save your life. Wear them always.  - Smoke detectors on every level of your home, check batteries every year.  - Eye Doctor - have an eye exam every 1-2 years  - Safe sex - if you may be exposed to STDs, use a condom.  - Alcohol -  If you drink, do it moderately, less than 2 drinks per day.  - Health Care Power of Attorney. Choose someone to speak for you if you are not able.  - Depression is common in our stressful world.If you're feeling down or losing interest in things you normally enjoy, please come in for a visit.  - Violence - If anyone is threatening or hurting you, please call immediately.  Due to recent changes in healthcare laws, you may see results of your imaging and/or laboratory studies on MyChart before I have had a chance to review them.  I understand that in some cases there may be results that are confusing or concerning to you. Please understand that not all results are received at the same time and often I may need to interpret multiple results in order to provide you with the best plan of care or course of treatment. Therefore, I ask that you please give me 2 business days to thoroughly review all your results before contacting my office for clarification. Should we see a critical lab result, you will be contacted sooner.   I will see you again in one year for your annual comprehensive exam unless otherwise stated and or with acute concerns.  It was a pleasure seeing you today! Please do not hesitate to reach out with any questions and or concerns.  Regards,   Tabitha Dugal    

## 2022-11-04 NOTE — Assessment & Plan Note (Signed)
Ordered lipid panel, pending results. Work on low cholesterol diet and exercise as tolerated  

## 2022-11-04 NOTE — Assessment & Plan Note (Signed)
Pt advised to work on diet and exercise as tolerated  

## 2022-11-04 NOTE — Assessment & Plan Note (Signed)
Stable Continue ramipril 5 mg once daily

## 2022-11-07 ENCOUNTER — Ambulatory Visit (INDEPENDENT_AMBULATORY_CARE_PROVIDER_SITE_OTHER): Payer: Medicare Other

## 2022-11-07 VITALS — Wt 294.0 lb

## 2022-11-07 DIAGNOSIS — Z0000 Encounter for general adult medical examination without abnormal findings: Secondary | ICD-10-CM

## 2022-11-07 NOTE — Patient Instructions (Signed)
[name] , Thank you for taking time to come for your Medicare Wellness Visit. I appreciate your ongoing commitment to your health goals. Please review the following plan we discussed and let me know if I can assist you in the future.   Referrals/Orders/Follow-Ups/Clinician Recommendations: lose weight and exercise more   This is a list of the screening recommended for you and due dates:  Health Maintenance  Topic Date Due   DEXA scan (bone density measurement)  Never done   Flu Shot  10/24/2022   COVID-19 Vaccine (5 - 2023-24 season) 07/11/2023*   Mammogram  05/02/2023   Medicare Annual Wellness Visit  11/07/2023   Colon Cancer Screening  12/22/2024   DTaP/Tdap/Td vaccine (3 - Td or Tdap) 09/19/2026   Pneumonia Vaccine  Completed   Hepatitis C Screening  Completed   Zoster (Shingles) Vaccine  Completed   HPV Vaccine  Aged Out  *Topic was postponed. The date shown is not the original due date.    Advanced directives: (Copy Requested) Please bring a copy of your health care power of attorney and living will to the office to be added to your chart at your convenience.  Next Medicare Annual Wellness Visit scheduled for next year: Yes  Preventive Care 76 Years and Older, Female Preventive care refers to lifestyle choices and visits with your health care provider that can promote health and wellness. What does preventive care include? A yearly physical exam. This is also called an annual well check. Dental exams once or twice a year. Routine eye exams. Ask your health care provider how often you should have your eyes checked. Personal lifestyle choices, including: Daily care of your teeth and gums. Regular physical activity. Eating a healthy diet. Avoiding tobacco and drug use. Limiting alcohol use. Practicing safe sex. Taking low-dose aspirin every day. Taking vitamin and mineral supplements as recommended by your health care provider. What happens during an annual well  check? The services and screenings done by your health care provider during your annual well check will depend on your age, overall health, lifestyle risk factors, and family history of disease. Counseling  Your health care provider may ask you questions about your: Alcohol use. Tobacco use. Drug use. Emotional well-being. Home and relationship well-being. Sexual activity. Eating habits. History of falls. Memory and ability to understand (cognition). Work and work Astronomer. Reproductive health. Screening  You may have the following tests or measurements: Height, weight, and BMI. Blood pressure. Lipid and cholesterol levels. These may be checked every 5 years, or more frequently if you are over 36 years old. Skin check. Lung cancer screening. You may have this screening every year starting at age 37 if you have a 30-pack-year history of smoking and currently smoke or have quit within the past 15 years. Fecal occult blood test (FOBT) of the stool. You may have this test every year starting at age 31. Flexible sigmoidoscopy or colonoscopy. You may have a sigmoidoscopy every 5 years or a colonoscopy every 10 years starting at age 35. Hepatitis C blood test. Hepatitis B blood test. Sexually transmitted disease (STD) testing. Diabetes screening. This is done by checking your blood sugar (glucose) after you have not eaten for a while (fasting). You may have this done every 1-3 years. Bone density scan. This is done to screen for osteoporosis. You may have this done starting at age 48. Mammogram. This may be done every 1-2 years. Talk to your health care provider about how often you should have  regular mammograms. Talk with your health care provider about your test results, treatment options, and if necessary, the need for more tests. Vaccines  Your health care provider may recommend certain vaccines, such as: Influenza vaccine. This is recommended every year. Tetanus, diphtheria, and  acellular pertussis (Tdap, Td) vaccine. You may need a Td booster every 10 years. Zoster vaccine. You may need this after age 29. Pneumococcal 13-valent conjugate (PCV13) vaccine. One dose is recommended after age 41. Pneumococcal polysaccharide (PPSV23) vaccine. One dose is recommended after age 1. Talk to your health care provider about which screenings and vaccines you need and how often you need them. This information is not intended to replace advice given to you by your health care provider. Make sure you discuss any questions you have with your health care provider. Document Released: 04/07/2015 Document Revised: 11/29/2015 Document Reviewed: 01/10/2015 Elsevier Interactive Patient Education  2017 [employer].  Fall Prevention in the Home Falls can cause injuries. They can happen to people of all ages. There are many things you can do to make your home safe and to help prevent falls. What can I do on the outside of my home? Regularly fix the edges of walkways and driveways and fix any cracks. Remove anything that might make you trip as you walk through a door, such as a raised step or threshold. Trim any bushes or trees on the path to your home. Use bright outdoor lighting. Clear any walking paths of anything that might make someone trip, such as rocks or tools. Regularly check to see if handrails are loose or broken. Make sure that both sides of any steps have handrails. Any raised decks and porches should have guardrails on the edges. Have any leaves, snow, or ice cleared regularly. Use sand or salt on walking paths during winter. Clean up any spills in your garage right away. This includes oil or grease spills. What can I do in the bathroom? Use night lights. Install grab bars by the toilet and in the tub and shower. Do not use towel bars as grab bars. Use non-skid mats or decals in the tub or shower. If you need to sit down in the shower, use a plastic, non-slip stool. Keep  the floor dry. Clean up any water that spills on the floor as soon as it happens. Remove soap buildup in the tub or shower regularly. Attach bath mats securely with double-sided non-slip rug tape. Do not have throw rugs and other things on the floor that can make you trip. What can I do in the bedroom? Use night lights. Make sure that you have a light by your bed that is easy to reach. Do not use any sheets or blankets that are too big for your bed. They should not hang down onto the floor. Have a firm chair that has side arms. You can use this for support while you get dressed. Do not have throw rugs and other things on the floor that can make you trip. What can I do in the kitchen? Clean up any spills right away. Avoid walking on wet floors. Keep items that you use a lot in easy-to-reach places. If you need to reach something above you, use a strong step stool that has a grab bar. Keep electrical cords out of the way. Do not use floor polish or wax that makes floors slippery. If you must use wax, use non-skid floor wax. Do not have throw rugs and other things on the floor that  can make you trip. What can I do with my stairs? Do not leave any items on the stairs. Make sure that there are handrails on both sides of the stairs and use them. Fix handrails that are broken or loose. Make sure that handrails are as long as the stairways. Check any carpeting to make sure that it is firmly attached to the stairs. Fix any carpet that is loose or worn. Avoid having throw rugs at the top or bottom of the stairs. If you do have throw rugs, attach them to the floor with carpet tape. Make sure that you have a light switch at the top of the stairs and the bottom of the stairs. If you do not have them, ask someone to add them for you. What else can I do to help prevent falls? Wear shoes that: Do not have high heels. Have rubber bottoms. Are comfortable and fit you well. Are closed at the toe. Do not  wear sandals. If you use a stepladder: Make sure that it is fully opened. Do not climb a closed stepladder. Make sure that both sides of the stepladder are locked into place. Ask someone to hold it for you, if possible. Clearly mark and make sure that you can see: Any grab bars or handrails. First and last steps. Where the edge of each step is. Use tools that help you move around (mobility aids) if they are needed. These include: Canes. Walkers. Scooters. Crutches. Turn on the lights when you go into a dark area. Replace any light bulbs as soon as they burn out. Set up your furniture so you have a clear path. Avoid moving your furniture around. If any of your floors are uneven, fix them. If there are any pets around you, be aware of where they are. Review your medicines with your doctor. Some medicines can make you feel dizzy. This can increase your chance of falling. Ask your doctor what other things that you can do to help prevent falls. This information is not intended to replace advice given to you by your health care provider. Make sure you discuss any questions you have with your health care provider. Document Released: 01/05/2009 Document Revised: 08/17/2015 Document Reviewed: 04/15/2014 Elsevier Interactive Patient Education  2017 [employer].

## 2022-11-07 NOTE — Progress Notes (Signed)
Subjective:   [name] is a 77 y.o. female who presents for Medicare Annual (Subsequent) preventive examination.  Visit Complete: Virtual  I connected with  Hampton Abbot on 11/07/22 by a audio enabled telemedicine application and verified that I am speaking with the correct person using two identifiers.  Patient Location: Home  Provider Location: Office/Clinic  I discussed the limitations of evaluation and management by telemedicine. The patient expressed understanding and agreed to proceed.  Patient Medicare AWV questionnaire was completed by the patient on 11/04/22; I have confirmed that all information answered by patient is correct and no changes since this date.   Vital Signs: Unable to obtain new vitals due to this being a telehealth visit.   Review of Systems     Cardiac Risk Factors include: advanced age (>8men, >60 women);hypertension;dyslipidemia;obesity (BMI >30kg/m2)     Objective:    Today's Vitals   11/07/22 1605  Weight: 294 lb (133.4 kg)   Body mass index is 52.92 kg/m.     11/07/2022    4:09 PM 10/30/2021    8:31 AM 10/20/2020    8:53 AM 05/11/2019    8:56 AM 09/30/2018    8:16 AM 09/24/2017    8:00 AM 08/27/2013    9:44 AM  Advanced Directives  Does Patient Have a Medical Advance Directive? Yes Yes Yes Yes Yes No Patient does not have advance directive  Type of Advance Directive Healthcare Power of [place];Living will Living will Healthcare Power of [place];Living will Healthcare Power of [place];Living will Healthcare Power of [place];Living will    Does patient want to make changes to medical advance directive?  No - Patient declined  No - Patient declined No - Patient declined    Copy of Healthcare Power of Attorney in Chart? No - copy requested  No - copy requested  No - copy requested    Would patient like information on creating a medical advance directive?      No - Patient declined   Pre-existing out of facility DNR order  (yellow form or pink MOST form)       No    Current Medications (verified) Outpatient Encounter Medications as of 11/07/2022  Medication Sig   atorvastatin (LIPITOR) 10 MG tablet Take 1 tablet (10 mg total) by mouth daily.   Betamethasone Valerate 0.12 % foam Apply 1 application topically 2 (two) times daily as needed.   Calcium Carbonate-Vitamin D 600-400 MG-UNIT tablet Take 1 tablet by mouth daily.   hydrochlorothiazide (HYDRODIURIL) 25 MG tablet TAKE 1 TABLET (25 MG TOTAL) BY MOUTH DAILY.   Omega-3 Fatty Acids (FISH OIL PO) Take by mouth.   ramipril (ALTACE) 5 MG capsule TAKE 1 CAPSULE BY MOUTH EVERY DAY   No facility-administered encounter medications on file as of 11/07/2022.    Allergies (verified) No known allergies   History: Past Medical History:  Diagnosis Date   Arthritis    "both knees" (07/07/2012)   Hypertension    on meds   PONV (postoperative nausea and vomiting)    Past Surgical History:  Procedure Laterality Date   COLONOSCOPY N/A 08/27/2013   Procedure: COLONOSCOPY;  Surgeon: Hart Carwin, MD;  Location: [facility];  Service: Endoscopy;  Laterality: N/A;   JOINT REPLACEMENT     TOTAL KNEE ARTHROPLASTY Right 07/07/2012   TOTAL KNEE ARTHROPLASTY Right 07/07/2012   Procedure: TOTAL KNEE ARTHROPLASTY;  Surgeon: Cammy Copa, MD;  Location: [facility] OR;  Service: Orthopedics;  Laterality: Right;  Right Total  Knee Arthroplasty   TOTAL KNEE ARTHROPLASTY Left 10/13/2012   Procedure: TOTAL KNEE ARTHROPLASTY;  Surgeon: Cammy Copa, MD;  Location: [facility] OR;  Service: Orthopedics;  Laterality: Left;   VAGINAL DELIVERY     X3   Family History  Problem Relation Age of Onset   Colon cancer Mother 7   Colon polyps Mother 87   Colon cancer Father 19   Colon polyps Father 23   Esophageal cancer Neg Hx    Rectal cancer Neg Hx    Stomach cancer Neg Hx    Breast cancer Neg Hx    Social History   Socioeconomic History   Marital status: Widowed    Spouse name: Kathlene November    Number of children: 3   Years of education: high school   Highest education level: 12th grade  Occupational History   Occupation: retired  Tobacco Use   Smoking status: Never   Smokeless tobacco: Never  Vaping Use   Vaping status: Never Used  Substance and Sexual Activity   Alcohol use: No   Drug use: Yes   Sexual activity: Not Currently    Partners: Male  Other Topics Concern   Not on file  Social History Narrative   Desires CPR.   Would want life support but not for prolonged period of time.   Does not want feeding tubes.   Has discussed this with her husband, Kathlene November.      04/27/20   From: the area   Living: widowed, passed away two years ago 11/24/2020, cancer bladder cancer   Work: retired Physicist, medical office      Family: 3 children - Georganna Skeans, Clifton Custard, and Leroy - 4 granddaughters      Enjoys: swimming      Exercise: not currently   Diet: not great      Safety   Seat belts: Yes    Guns: Yes  and secure   Safe in relationships: Yes    Social Determinants of Health   Financial Resource Strain: Low Risk  (11/04/2022)   Overall Financial Resource Strain (CARDIA)    Difficulty of Paying Living Expenses: Not hard at all  Food Insecurity: No Food Insecurity (11/04/2022)   Hunger Vital Sign    Worried About Running Out of Food in the Last Year: Never true    Ran Out of Food in the Last Year: Never true  Transportation Needs: No Transportation Needs (11/04/2022)   PRAPARE - Administrator, Civil Service (Medical): No    Lack of Transportation (Non-Medical): No  Physical Activity: Patient Declined (11/04/2022)   Exercise Vital Sign    Days of Exercise per Week: Patient declined    Minutes of Exercise per Session: Patient declined  Stress: No Stress Concern Present (11/04/2022)   [employer] of Occupational Health - Occupational Stress Questionnaire    Feeling of Stress : Not at all  Social Connections: Moderately Integrated (11/04/2022)   Social Connection and  Isolation Panel [NHANES]    Frequency of Communication with Friends and Family: More than three times a week    Frequency of Social Gatherings with Friends and Family: Three times a week    Attends Religious Services: More than 4 times per year    Active Member of Clubs or Organizations: Yes    Attends Banker Meetings: More than 4 times per year    Marital Status: Widowed    Tobacco Counseling Counseling given: Not Answered   Clinical Intake:  Pre-visit preparation completed: Yes  Pain : No/denies pain     BMI - recorded: 52.92 Nutritional Status: BMI > 30  Obese Nutritional Risks: None Diabetes: No  How often do you need to have someone help you when you read instructions, pamphlets, or other written materials from your doctor or pharmacy?: 1 - Never  Interpreter Needed?: No  Information entered by :: Lanier Ensign, LPN   Activities of Daily Living    11/04/2022    2:51 PM  In your present state of health, do you have any difficulty performing the following activities:  Hearing? 0  Vision? 0  Difficulty concentrating or making decisions? 0  Walking or climbing stairs? 0  Dressing or bathing? 0  Doing errands, shopping? 0  Preparing Food and eating ? N  Using the Toilet? N  In the past six months, have you accidently leaked urine? N  Do you have problems with loss of bowel control? N  Managing your Medications? N  Managing your Finances? N  Housekeeping or managing your Housekeeping? N    Patient Care Team: Mort Sawyers, FNP as PCP - General (Family Medicine) Hart Carwin, MD (Inactive) as Consulting Physician (Gastroenterology) August Saucer Corrie Mckusick, MD as Consulting Physician (Orthopedic Surgery)  Indicate any recent Medical Services you may have received from other than [facility] providers in the past year (date may be approximate).     Assessment:   This is a routine wellness examination for [place].  Hearing/Vision screen Hearing  Screening - Comments:: Pt denies any hearing issues  Vision Screening - Comments:: Pt follows up with [facility]   Dietary issues and exercise activities discussed:     Goals Addressed             This Visit's Progress    Patient Stated       To lose more weight and exercise more        Depression Screen    11/07/2022    4:08 PM 07/11/2022    8:20 AM 04/10/2022   10:15 AM 10/30/2021    8:21 AM 10/20/2020    8:54 AM 10/15/2019    9:33 AM 04/23/2019   11:58 AM  PHQ 2/9 Scores  PHQ - 2 Score 0  0 0 0 0 0  PHQ- 9 Score   0  0    Exception Documentation  Patient refusal         Fall Risk    11/04/2022    2:51 PM 04/10/2022   10:15 AM 10/30/2021    8:21 AM 10/20/2020    8:54 AM 10/15/2019    9:33 AM  Fall Risk   Falls in the past year? 0 0 0 0 0  Number falls in past yr:   0 0   Injury with Fall? 0  0 0   Risk for fall due to : Impaired vision  No Fall Risks Medication side effect   Follow up Falls prevention discussed Education provided Falls evaluation completed Falls evaluation completed;Falls prevention discussed     MEDICARE RISK AT HOME:   TIMED UP AND GO:  Was the test performed?  No    Cognitive Function:    10/20/2020    8:59 AM 09/24/2017    8:01 AM  MMSE - Mini Mental State Exam  Not completed: Refused   Orientation to time  5  Orientation to Place  5  Registration  3  Attention/ Calculation  5  Recall  3  Language- name  2 objects  2  Language- repeat  1  Language- follow 3 step command  3  Language- read & follow direction  1  Write a sentence  1  Copy design  1  Total score  30        11/07/2022    4:09 PM  6CIT Screen  What Year? 0 points  What month? 0 points  What time? 0 points  Count back from 20 0 points  Months in reverse 0 points  Repeat phrase 0 points  Total Score 0 points    Immunizations Immunization History  Administered Date(s) Administered   Fluad Quad(high Dose 65+) 12/22/2018, 12/28/2018, 12/18/2019,  12/29/2020, 01/01/2022   Influenza Split 12/19/2010, 12/18/2011   Influenza Whole 01/02/2007, 12/23/2007, 12/21/2008, 12/13/2009   Influenza,inj,Quad PF,6+ Mos 12/15/2012, 12/22/2013, 12/07/2014, 12/29/2015, 12/27/2016, 12/25/2017   PFIZER Comirnaty(Gray Top)Covid-19 Tri-Sucrose Vaccine 05/01/2019, 05/22/2019, 12/20/2019   Pfizer Covid-19 Vaccine Bivalent Booster 36yrs & up 01/31/2021   Pneumococcal Conjugate-13 09/13/2013   Pneumococcal Polysaccharide-23 07/08/2012   Td 09/03/2005   Tdap 09/18/2016   Zoster Recombinant(Shingrix) 04/19/2021, 08/09/2021   Zoster, Live 05/01/2009    TDAP status: Up to date  Flu Vaccine status: Due, Education has been provided regarding the importance of this vaccine. Advised may receive this vaccine at local pharmacy or Health Dept. Aware to provide a copy of the vaccination record if obtained from local pharmacy or Health Dept. Verbalized acceptance and understanding.  Pneumococcal vaccine status: Up to date  Covid-19 vaccine status: Information provided on how to obtain vaccines.   Qualifies for Shingles Vaccine? Yes   Zostavax completed Yes   Shingrix Completed?: Yes  Screening Tests Health Maintenance  Topic Date Due   DEXA SCAN  Never done   INFLUENZA VACCINE  10/24/2022   COVID-19 Vaccine (5 - 2023-24 season) 07/11/2023 (Originally 11/23/2021)   MAMMOGRAM  05/02/2023   Medicare Annual Wellness (AWV)  11/07/2023   Colonoscopy  12/22/2024   DTaP/Tdap/Td (3 - Td or Tdap) 09/19/2026   Pneumonia Vaccine 32+ Years old  Completed   Hepatitis C Screening  Completed   Zoster Vaccines- Shingrix  Completed   HPV VACCINES  Aged Out    Health Maintenance  Health Maintenance Due  Topic Date Due   DEXA SCAN  Never done   INFLUENZA VACCINE  10/24/2022    Colorectal cancer screening: Type of screening: Colonoscopy. Completed 12/23/19. Repeat every 5 years  Mammogram status: Completed 11/04/22. Repeat every year  Bone density completed  11/04/22   Additional Screening:  Hepatitis C Screening: ; Completed 09/12/16  Vision Screening: Recommended annual ophthalmology exams for early detection of glaucoma and other disorders of the eye. Is the patient up to date with their annual eye exam?  Yes  Who is the provider or what is the name of the office in which the patient attends annual eye exams? [facility]  If pt is not established with a provider, would they like to be referred to a provider to establish care? No .   Dental Screening: Recommended annual dental exams for proper oral hygiene    Community Resource Referral / Chronic Care Management: CRR required this visit?  No   CCM required this visit?  No     Plan:     I have personally reviewed and noted the following in the patient's chart:   Medical and social history Use of alcohol, tobacco or illicit drugs  Current medications and supplements including opioid prescriptions. Patient is not currently taking  opioid prescriptions. Functional ability and status Nutritional status Physical activity Advanced directives List of other physicians Hospitalizations, surgeries, and ER visits in previous 12 months Vitals Screenings to include cognitive, depression, and falls Referrals and appointments  In addition, I have reviewed and discussed with patient certain preventive protocols, quality metrics, and best practice recommendations. A written personalized care plan for preventive services as well as general preventive health recommendations were provided to patient.     Marzella Schlein, LPN   1/61/0960   After Visit Summary: (MyChart) Due to this being a telephonic visit, the after visit summary with patients personalized plan was offered to patient via MyChart   Nurse Notes: none

## 2022-11-20 DIAGNOSIS — L720 Epidermal cyst: Secondary | ICD-10-CM | POA: Diagnosis not present

## 2022-11-20 DIAGNOSIS — L820 Inflamed seborrheic keratosis: Secondary | ICD-10-CM | POA: Diagnosis not present

## 2022-12-11 ENCOUNTER — Other Ambulatory Visit: Payer: Self-pay | Admitting: Family

## 2022-12-11 MED ORDER — HYDROCHLOROTHIAZIDE 25 MG PO TABS: 25.0000 mg | ORAL_TABLET | Freq: Every day | ORAL | 3 refills | Status: DC

## 2022-12-11 NOTE — Telephone Encounter (Signed)
LOV: 11/04/2022 Pending OV: Nothing scheduled at this time Medication hydrochlorothiazide 25mg   Last Refill: 12/15/2021 Qty: #90 with 3 refills

## 2023-01-06 DIAGNOSIS — Z23 Encounter for immunization: Secondary | ICD-10-CM | POA: Diagnosis not present

## 2023-01-22 ENCOUNTER — Ambulatory Visit: Payer: Medicare Other

## 2023-04-01 DIAGNOSIS — H353132 Nonexudative age-related macular degeneration, bilateral, intermediate dry stage: Secondary | ICD-10-CM | POA: Diagnosis not present

## 2023-04-01 DIAGNOSIS — H35372 Puckering of macula, left eye: Secondary | ICD-10-CM | POA: Diagnosis not present

## 2023-04-01 DIAGNOSIS — H2513 Age-related nuclear cataract, bilateral: Secondary | ICD-10-CM | POA: Diagnosis not present

## 2023-04-01 DIAGNOSIS — H524 Presbyopia: Secondary | ICD-10-CM | POA: Diagnosis not present

## 2023-04-01 DIAGNOSIS — H25013 Cortical age-related cataract, bilateral: Secondary | ICD-10-CM | POA: Diagnosis not present

## 2023-04-01 DIAGNOSIS — H5213 Myopia, bilateral: Secondary | ICD-10-CM | POA: Diagnosis not present

## 2023-04-01 LAB — HM DIABETES EYE EXAM

## 2023-04-07 ENCOUNTER — Encounter: Payer: Self-pay | Admitting: Family

## 2023-04-07 DIAGNOSIS — H3500 Unspecified background retinopathy: Secondary | ICD-10-CM | POA: Insufficient documentation

## 2023-04-07 NOTE — Progress Notes (Signed)
 noted

## 2023-05-06 ENCOUNTER — Ambulatory Visit
Admission: RE | Admit: 2023-05-06 | Discharge: 2023-05-06 | Disposition: A | Discharge status: Home / Self Care | Payer: Medicare Other | Source: Ambulatory Visit | Attending: Family | Admitting: Family

## 2023-05-06 DIAGNOSIS — Z1231 Encounter for screening mammogram for malignant neoplasm of breast: Secondary | ICD-10-CM | POA: Diagnosis not present

## 2023-05-08 ENCOUNTER — Encounter: Payer: Self-pay | Admitting: Family

## 2023-06-26 ENCOUNTER — Other Ambulatory Visit: Payer: Self-pay | Admitting: *Deleted

## 2023-06-26 DIAGNOSIS — E7800 Pure hypercholesterolemia, unspecified: Secondary | ICD-10-CM

## 2023-06-26 MED ORDER — ATORVASTATIN CALCIUM 10 MG PO TABS
10.0000 mg | ORAL_TABLET | Freq: Every day | ORAL | 0 refills | Status: DC
Start: 2023-06-26 — End: 2023-09-22

## 2023-09-21 ENCOUNTER — Other Ambulatory Visit: Payer: Self-pay | Admitting: Family

## 2023-09-21 DIAGNOSIS — E7800 Pure hypercholesterolemia, unspecified: Secondary | ICD-10-CM

## 2023-09-22 NOTE — Telephone Encounter (Signed)
 Spoke to pt, sch ov for 10/14/23

## 2023-10-14 ENCOUNTER — Encounter: Payer: Self-pay | Admitting: Family

## 2023-10-14 ENCOUNTER — Ambulatory Visit (INDEPENDENT_AMBULATORY_CARE_PROVIDER_SITE_OTHER): Admitting: Family

## 2023-10-14 VITALS — BP 134/88 | HR 68 | Temp 98.0°F | Ht 62.5 in | Wt 282.0 lb

## 2023-10-14 DIAGNOSIS — I4891 Unspecified atrial fibrillation: Secondary | ICD-10-CM

## 2023-10-14 DIAGNOSIS — N1831 Chronic kidney disease, stage 3a: Secondary | ICD-10-CM | POA: Diagnosis not present

## 2023-10-14 DIAGNOSIS — R809 Proteinuria, unspecified: Secondary | ICD-10-CM | POA: Diagnosis not present

## 2023-10-14 DIAGNOSIS — Z6841 Body Mass Index (BMI) 40.0 and over, adult: Secondary | ICD-10-CM | POA: Diagnosis not present

## 2023-10-14 DIAGNOSIS — I10 Essential (primary) hypertension: Secondary | ICD-10-CM | POA: Diagnosis not present

## 2023-10-14 DIAGNOSIS — R7303 Prediabetes: Secondary | ICD-10-CM

## 2023-10-14 DIAGNOSIS — Z1231 Encounter for screening mammogram for malignant neoplasm of breast: Secondary | ICD-10-CM | POA: Diagnosis not present

## 2023-10-14 DIAGNOSIS — I499 Cardiac arrhythmia, unspecified: Secondary | ICD-10-CM | POA: Insufficient documentation

## 2023-10-14 DIAGNOSIS — Z780 Asymptomatic menopausal state: Secondary | ICD-10-CM | POA: Diagnosis not present

## 2023-10-14 DIAGNOSIS — E7800 Pure hypercholesterolemia, unspecified: Secondary | ICD-10-CM

## 2023-10-14 DIAGNOSIS — E66813 Obesity, class 3: Secondary | ICD-10-CM

## 2023-10-14 MED ORDER — ATORVASTATIN CALCIUM 10 MG PO TABS: 10.0000 mg | ORAL_TABLET | Freq: Every day | ORAL | 3 refills | Status: AC

## 2023-10-14 MED ORDER — APIXABAN 5 MG PO TABS: 5.0000 mg | ORAL_TABLET | Freq: Two times a day (BID) | ORAL | 2 refills | Status: DC

## 2023-10-14 NOTE — Assessment & Plan Note (Signed)
 Pt advised of the following: Work on a diabetic diet, try to incorporate exercise at least 20-30 a day for 3 days a week or more.

## 2023-10-14 NOTE — Patient Instructions (Addendum)
 I have sent an electronic order over to your preferred location for the following:   []   2D Mammogram  [x]   3D Mammogram  [x]   Bone Density   Please give this center a call to get scheduled at your convenience.   [x]   [facility]      [address] [place], [place]        [phone]         Make sure to wear two piece  clothing  No lotions powders or deodorants the day of the appointment Make sure to bring picture ID and insurance card.  Bring list of medications you are currently taking including any supplements.   ------------------------------------  Ordering an echocardiogram  Please let us  know if you have not heard back within 2 weeks about the referral.

## 2023-10-14 NOTE — Assessment & Plan Note (Addendum)
 Stable today  Continue ramipril  5 mg daily

## 2023-10-14 NOTE — Progress Notes (Signed)
 Established Patient Office Visit  Subjective:      CC:  Chief Complaint  Patient presents with   Medical Management of Chronic Issues    HPI: [name] is a 78 y.o. female presenting on 10/14/2023 for Medical Management of Chronic Issues  HTN: stable, continue on ramipril  5 mg once daily.   Prediabetic: working on diet being mindful of her portion and trying to stay away from diabetic foods. Trying to increase water intake. She isn't overly active she knows she needs to start being more active.   Irregular rhythm on exam, pt does report at times will feel irregular heart beat, no sob or doe per her report, no chest pain.    Social history:  Relevant past medical, surgical, family and social history reviewed and updated as indicated. Interim medical history since our last visit reviewed.  Allergies and medications reviewed and updated.  DATA REVIEWED: CHART IN EPIC     ROS: Negative unless specifically indicated above in HPI.    Current Outpatient Medications:    apixaban  (ELIQUIS ) 5 MG TABS tablet, Take 1 tablet (5 mg total) by mouth 2 (two) times daily., Disp: 60 tablet, Rfl: 2   Betamethasone  Valerate 0.12 % foam, Apply 1 application topically 2 (two) times daily as needed., Disp: 100 g, Rfl: 1   Calcium  Carbonate-Vitamin D  600-400 MG-UNIT tablet, Take 1 tablet by mouth daily., Disp: , Rfl:    hydrochlorothiazide  (HYDRODIURIL ) 25 MG tablet, Take 1 tablet (25 mg total) by mouth daily., Disp: 90 tablet, Rfl: 3   Omega-3 Fatty Acids (FISH OIL PO), Take by mouth., Disp: , Rfl:    ramipril  (ALTACE ) 5 MG capsule, TAKE 1 CAPSULE BY MOUTH EVERY DAY, Disp: 90 capsule, Rfl: 3   atorvastatin  (LIPITOR) 10 MG tablet, Take 1 tablet (10 mg total) by mouth daily., Disp: 90 tablet, Rfl: 3      Objective:    BP 134/88 (BP Location: Left Arm, Patient Position: Sitting, Cuff Size: Large)   Pulse 68   Temp 98 F (36.7 C) (Temporal)   Ht 5' 2.5 (1.588 m)   Wt 282 lb  (127.9 kg)   SpO2 94%   BMI 50.76 kg/m   Wt Readings from Last 3 Encounters:  10/14/23 282 lb (127.9 kg)  11/07/22 294 lb (133.4 kg)  11/04/22 294 lb (133.4 kg)    Physical Exam Vitals reviewed.  Constitutional:      General: She is not in acute distress.    Appearance: Normal appearance. She is obese. She is not ill-appearing, toxic-appearing or diaphoretic.  HENT:     Head: Normocephalic.  Cardiovascular:     Rate and Rhythm: Normal rate. Rhythm irregular.  Pulmonary:     Effort: Pulmonary effort is normal.  Musculoskeletal:        General: Normal range of motion.  Neurological:     General: No focal deficit present.     Mental Status: She is alert and oriented to person, place, and time. Mental status is at baseline.  Psychiatric:        Mood and Affect: Mood normal.        Behavior: Behavior normal.        Thought Content: Thought content normal.        Judgment: Judgment normal.    Irregular heart rhythm on exam pt does state that she had three cups of coffee this am        Assessment & Plan:  Pre-diabetes Assessment & Plan:  Pt advised of the following: Work on a diabetic diet, try to incorporate exercise at least 20-30 a day for 3 days a week or more.     Microalbuminuria  Essential hypertension Assessment & Plan: Stable today  Continue ramipril  5 mg daily   Orders: -     Apixaban ; Take 1 tablet (5 mg total) by mouth 2 (two) times daily.  Dispense: 60 tablet; Refill: 2 -     ECHOCARDIOGRAM COMPLETE; Future  Pure hypercholesterolemia Assessment & Plan: Ordered lipid panel, pending results. Work on low cholesterol diet and exercise as tolerated Continue atorvastatin  10 mg nightly   Orders: -     Atorvastatin  Calcium ; Take 1 tablet (10 mg total) by mouth daily.  Dispense: 90 tablet; Refill: 3  Stage 3a chronic kidney disease (HCC)  Class 3 severe obesity due to excess calories with serious comorbidity and body mass index (BMI) of 50.0 to 59.9 in  adult -     Apixaban ; Take 1 tablet (5 mg total) by mouth 2 (two) times daily.  Dispense: 60 tablet; Refill: 2  Screening mammogram for breast cancer -     3D Screening Mammogram, Left and Right; Future  Postmenopausal -     DG Bone Density; Future  Irregular heart rhythm Assessment & Plan: Irregular heart rhythm on exam , EKG in office ventricular rate 111 bpm left axis deviation no acute ST changes  Advised pt to decrease/avoid caffeine   CHA?DS?-VASc score 4 points.  Start eliquis   Referral to cardiology  Ordering echocardiogram    Orders: -     EKG 12-Lead -     Apixaban ; Take 1 tablet (5 mg total) by mouth 2 (two) times daily.  Dispense: 60 tablet; Refill: 2 -     ECHOCARDIOGRAM COMPLETE; Future  Atrial fibrillation, unspecified type (HCC) -     Apixaban ; Take 1 tablet (5 mg total) by mouth 2 (two) times daily.  Dispense: 60 tablet; Refill: 2 -     ECHOCARDIOGRAM COMPLETE; Future     Return for as scheduled for CPE .  Ginger Patrick, MSN, APRN, FNP-C [facility]

## 2023-10-14 NOTE — Assessment & Plan Note (Signed)
 Ordered lipid panel, pending results. Work on low cholesterol diet and exercise as tolerated Continue atorvastatin 10 mg nightly

## 2023-10-14 NOTE — Assessment & Plan Note (Addendum)
 Irregular heart rhythm on exam , EKG in office ventricular rate 111 bpm left axis deviation no acute ST changes  Advised pt to decrease/avoid caffeine   CHA?DS?-VASc score 4 points.  Start eliquis   Referral to cardiology  Ordering echocardiogram

## 2023-10-25 ENCOUNTER — Other Ambulatory Visit: Payer: Self-pay | Admitting: Family

## 2023-10-25 DIAGNOSIS — I10 Essential (primary) hypertension: Secondary | ICD-10-CM

## 2023-11-10 ENCOUNTER — Encounter: Payer: Self-pay | Admitting: *Deleted

## 2023-11-12 NOTE — H&P (View-Only) (Signed)
 Cardiology Office Note   Date:  11/14/2023  ID:  Alyssa, [name] February 19, 1946, MRN [id number] PCP: Corwin Antu, FNP  [facility] Providers Cardiologist:  Caron Poser, MD     History of Present Illness [name] is a 78 y.o. female PMH obesity, PAF on Eliquis , HLD, HTN, CKD 3A who presents for further evaluation and management of atrial fibrillation.  Patient was recently diagnosed with atrial fibrillation on 10/14/2023 at her PCPs office.  She was started on Eliquis  5 mg twice daily at that time.  She reports that she has not missed any doses.  She says that she is otherwise feeling okay other than some mild dyspnea with exertion.  She denies any LE edema, orthopnea, syncope/presyncope, or any other signs or symptoms of heart failure.  We discussed rate versus rhythm control at length today.  She is amenable to trialing a rhythm control strategy.  Relevant CVD History -New atrial fibrillation diagnosed on ECG 10/14/2023   ROS: Pt denies any chest discomfort, jaw pain, arm pain, palpitations, syncope, presyncope, orthopnea, PND, or LE edema.  Studies Reviewed I have independently reviewed the patient's ECG and recent bloodwork.  Physical Exam VS:  BP 118/72 (BP Location: Right Arm, Patient Position: Sitting, Cuff Size: Large)   Pulse (!) 130   Ht 5' 2 (1.575 m)   Wt 274 lb 9.6 oz (124.6 kg)   SpO2 97%   BMI 50.22 kg/m        Wt Readings from Last 3 Encounters:  11/14/23 274 lb 9.6 oz (124.6 kg)  11/13/23 274 lb (124.3 kg)  10/14/23 282 lb (127.9 kg)    GEN: No acute distress. NECK: No JVD; No carotid bruits. CARDIAC: Irregular rate/rhythm, no murmurs, rubs, gallops. RESPIRATORY:  Clear to auscultation. EXTREMITIES:  Warm and well-perfused. No edema.  ASSESSMENT AND PLAN Persistent atrial fibrillation with RVR CHA2DS2-VASc of at least 4.  Unclear how long she has been in atrial fibrillation. HR 130 in office today. No symptoms of signs  of HF currently. We need to get her under control. We will try to keep her out of the hospital.  Plan: - Continue Eliquis  5 mg twice daily - Start metoprolol  tartrate 25 mg BID; uptitrate to HR <110 before cardioversion - Echo (one was ordered but never scheduled); ideally this will be done after cardioversion when her heart rates are under control - Since she has been anticoagulated without missing a dose for greater than 3 weeks, we will plan for DCCV.  Will consider referral to EP for PVI ablation or advanced rhythm control strategies if recurrence or if cardioversion fails, though habitus not ideal. - I gave her return precautions should she experience any significant worsening DOE, orthopnea, LE edema, or other signs or symptoms of heart failure.  I also advised her to come to the hospital if we are unable to get her heart rate <110 as an outpatient.  Per patient preference, we will try to do this as an outpatient and keep her out of the hospital  HTN Well-controlled.  Continue ramipril  5 mg daily and HCTZ 25 mg daily  HLD Well-controlled, last LDL 57 10/2022.  Continue Lipitor 10 mg daily      Informed Consent   The risks (stroke, cardiac arrhythmias rarely resulting in the need for a temporary or permanent pacemaker, skin irritation or burns and complications associated with conscious sedation including aspiration, arrhythmia, respiratory failure and death), benefits (restoration of normal sinus rhythm) and alternatives of  a direct current cardioversion were explained in detail to [name] and she agrees to proceed.       Dispo: RTC after DCCV, 2 weeks  Signed, Caron Poser, MD

## 2023-11-12 NOTE — Progress Notes (Unsigned)
 Cardiology Office Note   Date:  11/14/2023  ID:  [name], [name] February 19, 1946, MRN [id number] PCP: Corwin Antu, FNP  [facility] Providers Cardiologist:  Caron Poser, MD     History of Present Illness Ciarrah Rae is a 78 y.o. female PMH obesity, PAF on Eliquis , HLD, HTN, CKD 3A who presents for further evaluation and management of atrial fibrillation.  Patient was recently diagnosed with atrial fibrillation on 10/14/2023 at her PCPs office.  She was started on Eliquis  5 mg twice daily at that time.  She reports that she has not missed any doses.  She says that she is otherwise feeling okay other than some mild dyspnea with exertion.  She denies any LE edema, orthopnea, syncope/presyncope, or any other signs or symptoms of heart failure.  We discussed rate versus rhythm control at length today.  She is amenable to trialing a rhythm control strategy.  Relevant CVD History -New atrial fibrillation diagnosed on ECG 10/14/2023   ROS: Pt denies any chest discomfort, jaw pain, arm pain, palpitations, syncope, presyncope, orthopnea, PND, or LE edema.  Studies Reviewed I have independently reviewed the patient's ECG and recent bloodwork.  Physical Exam VS:  BP 118/72 (BP Location: Right Arm, Patient Position: Sitting, Cuff Size: Large)   Pulse (!) 130   Ht 5' 2 (1.575 m)   Wt 274 lb 9.6 oz (124.6 kg)   SpO2 97%   BMI 50.22 kg/m        Wt Readings from Last 3 Encounters:  11/14/23 274 lb 9.6 oz (124.6 kg)  11/13/23 274 lb (124.3 kg)  10/14/23 282 lb (127.9 kg)    GEN: No acute distress. NECK: No JVD; No carotid bruits. CARDIAC: Irregular rate/rhythm, no murmurs, rubs, gallops. RESPIRATORY:  Clear to auscultation. EXTREMITIES:  Warm and well-perfused. No edema.  ASSESSMENT AND PLAN Persistent atrial fibrillation with RVR CHA2DS2-VASc of at least 4.  Unclear how long she has been in atrial fibrillation. HR 130 in office today. No symptoms of signs  of HF currently. We need to get her under control. We will try to keep her out of the hospital.  Plan: - Continue Eliquis  5 mg twice daily - Start metoprolol  tartrate 25 mg BID; uptitrate to HR <110 before cardioversion - Echo (one was ordered but never scheduled); ideally this will be done after cardioversion when her heart rates are under control - Since she has been anticoagulated without missing a dose for greater than 3 weeks, we will plan for DCCV.  Will consider referral to EP for PVI ablation or advanced rhythm control strategies if recurrence or if cardioversion fails, though habitus not ideal. - I gave her return precautions should she experience any significant worsening DOE, orthopnea, LE edema, or other signs or symptoms of heart failure.  I also advised her to come to the hospital if we are unable to get her heart rate <110 as an outpatient.  Per patient preference, we will try to do this as an outpatient and keep her out of the hospital  HTN Well-controlled.  Continue ramipril  5 mg daily and HCTZ 25 mg daily  HLD Well-controlled, last LDL 57 10/2022.  Continue Lipitor 10 mg daily      Informed Consent   The risks (stroke, cardiac arrhythmias rarely resulting in the need for a temporary or permanent pacemaker, skin irritation or burns and complications associated with conscious sedation including aspiration, arrhythmia, respiratory failure and death), benefits (restoration of normal sinus rhythm) and alternatives of  a direct current cardioversion were explained in detail to Ms. Dubray and she agrees to proceed.       Dispo: RTC after DCCV, 2 weeks  Signed, Caron Poser, MD

## 2023-11-13 ENCOUNTER — Ambulatory Visit (INDEPENDENT_AMBULATORY_CARE_PROVIDER_SITE_OTHER)

## 2023-11-13 VITALS — BP 118/72 | Ht 62.5 in | Wt 274.0 lb

## 2023-11-13 DIAGNOSIS — Z0000 Encounter for general adult medical examination without abnormal findings: Secondary | ICD-10-CM | POA: Diagnosis not present

## 2023-11-13 NOTE — Progress Notes (Signed)
 Subjective:   [name] is a 78 y.o. who presents for a Medicare Wellness preventive visit.  As a reminder, Annual Wellness Visits don't include a physical exam, and some assessments may be limited, especially if this visit is performed virtually. We may recommend an in-person follow-up visit with your provider if needed.  Visit Complete: In person  Persons Participating in Visit: Patient.  AWV Questionnaire: Yes: Patient Medicare AWV questionnaire was completed by the patient on 11/09/23; I have confirmed that all information answered by patient is correct and no changes since this date.  Cardiac Risk Factors include: advanced age (>20men, >33 women);hypertension;obesity (BMI >30kg/m2);sedentary lifestyle     Objective:    Today's Vitals   11/13/23 0813  BP: 118/72  Weight: 274 lb (124.3 kg)  Height: 5' 2.5 (1.588 m)   Body mass index is 49.32 kg/m.     11/13/2023    8:28 AM 11/07/2022    4:09 PM 10/30/2021    8:31 AM 10/20/2020    8:53 AM 05/11/2019    8:56 AM 09/30/2018    8:16 AM 09/24/2017    8:00 AM  Advanced Directives  Does Patient Have a Medical Advance Directive? Yes Yes Yes Yes Yes Yes No   Type of Estate agent of [place];Living will Healthcare Power of [place];Living will Living will Healthcare Power of [place];Living will Healthcare Power of [place];Living will Healthcare Power of [place];Living will   Does patient want to make changes to medical advance directive?   No - Patient declined  No - Patient declined No - Patient declined    Copy of Healthcare Power of Attorney in Chart? No - copy requested No - copy requested  No - copy requested  No - copy requested    Would patient like information on creating a medical advance directive?       No - Patient declined      Data saved with a previous flowsheet row definition    Current Medications (verified) Outpatient Encounter Medications as of 11/13/2023  Medication Sig    apixaban  (ELIQUIS ) 5 MG TABS tablet Take 1 tablet (5 mg total) by mouth 2 (two) times daily.   atorvastatin  (LIPITOR) 10 MG tablet Take 1 tablet (10 mg total) by mouth daily.   Betamethasone  Valerate 0.12 % foam Apply 1 application topically 2 (two) times daily as needed.   Calcium  Carbonate-Vitamin D  600-400 MG-UNIT tablet Take 1 tablet by mouth daily.   hydrochlorothiazide  (HYDRODIURIL ) 25 MG tablet Take 1 tablet (25 mg total) by mouth daily.   Omega-3 Fatty Acids (FISH OIL PO) Take by mouth.   ramipril  (ALTACE ) 5 MG capsule TAKE 1 CAPSULE BY MOUTH EVERY DAY   No facility-administered encounter medications on file as of 11/13/2023.    Allergies (verified) No known allergies   History: Past Medical History:  Diagnosis Date   Arthritis    both knees (07/07/2012)   Hypertension    on meds   PONV (postoperative nausea and vomiting)    Past Surgical History:  Procedure Laterality Date   COLONOSCOPY N/A 08/27/2013   Procedure: COLONOSCOPY;  Surgeon: Princella CHRISTELLA Nida, MD;  Location: [facility];  Service: Endoscopy;  Laterality: N/A;   JOINT REPLACEMENT     TOTAL KNEE ARTHROPLASTY Right 07/07/2012   TOTAL KNEE ARTHROPLASTY Right 07/07/2012   Procedure: TOTAL KNEE ARTHROPLASTY;  Surgeon: Cordella Glendia Hutchinson, MD;  Location: [facility] OR;  Service: Orthopedics;  Laterality: Right;  Right Total Knee Arthroplasty   TOTAL KNEE  ARTHROPLASTY Left 10/13/2012   Procedure: TOTAL KNEE ARTHROPLASTY;  Surgeon: Cordella Glendia Hutchinson, MD;  Location: [facility] OR;  Service: Orthopedics;  Laterality: Left;   VAGINAL DELIVERY     X3   Family History  Problem Relation Age of Onset   Colon cancer Mother 82   Colon polyps Mother 58   Cancer Mother    Colon cancer Father 28   Colon polyps Father 21   Heart attack Maternal Grandfather    Esophageal cancer Neg Hx    Rectal cancer Neg Hx    Stomach cancer Neg Hx    Breast cancer Neg Hx    Social History   Socioeconomic History   Marital status: Widowed    Spouse name:  Garrel   Number of children: 3   Years of education: high school   Highest education level: 12th grade  Occupational History   Occupation: retired  Tobacco Use   Smoking status: Never   Smokeless tobacco: Never  Vaping Use   Vaping status: Never Used  Substance and Sexual Activity   Alcohol use: No   Drug use: Yes   Sexual activity: Not Currently    Partners: Male  Other Topics Concern   Not on file  Social History Narrative   Desires CPR.   Would want life support but not for prolonged period of time.   Does not want feeding tubes.   Has discussed this with her husband, Garrel.      04/27/20   From: the area   Living: widowed, passed away two years ago 2020-12-12, cancer bladder cancer   Work: retired Physicist, medical office      Family: 3 children - Dene Caldron, Beverley, and Barwick - 4 granddaughters      Enjoys: swimming      Exercise: not currently   Diet: not great      Safety   Seat belts: Yes    Guns: Yes  and secure   Safe in relationships: Yes    Social Drivers of Health   Financial Resource Strain: Low Risk  (11/13/2023)   Overall Financial Resource Strain (CARDIA)    Difficulty of Paying Living Expenses: Not hard at all  Food Insecurity: No Food Insecurity (11/13/2023)   Hunger Vital Sign    Worried About Running Out of Food in the Last Year: Never true    Ran Out of Food in the Last Year: Never true  Transportation Needs: No Transportation Needs (11/13/2023)   PRAPARE - Administrator, Civil Service (Medical): No    Lack of Transportation (Non-Medical): No  Physical Activity: Insufficiently Active (11/13/2023)   Exercise Vital Sign    Days of Exercise per Week: 2 days    Minutes of Exercise per Session: 30 min  Stress: No Stress Concern Present (11/13/2023)   [employer] of Occupational Health - Occupational Stress Questionnaire    Feeling of Stress: Not at all  Social Connections: Moderately Integrated (11/13/2023)   Social Connection and Isolation Panel     Frequency of Communication with Friends and Family: More than three times a week    Frequency of Social Gatherings with Friends and Family: Three times a week    Attends Religious Services: More than 4 times per year    Active Member of Clubs or Organizations: Yes    Attends Banker Meetings: More than 4 times per year    Marital Status: Widowed    Tobacco Counseling Counseling given: Not Answered  Clinical Intake:  Pre-visit preparation completed: Yes  Pain : No/denies pain     BMI - recorded: 49.32 Nutritional Status: BMI > 30  Obese Nutritional Risks: None Diabetes: No  Lab Results  Component Value Date   HGBA1C 6.2 11/04/2022   HGBA1C 6.2 04/10/2022   HGBA1C 6.2 10/30/2021     How often do you need to have someone help you when you read instructions, pamphlets, or other written materials from your doctor or pharmacy?: 1 - Never  Interpreter Needed?: No  Comments: lives alone Information entered by :: B.Miquan Tandon,LPN   Activities of Daily Living     11/09/2023    1:26 PM  In your present state of health, do you have any difficulty performing the following activities:  Hearing? 0  Vision? 0  Difficulty concentrating or making decisions? 0  Walking or climbing stairs? 0  Dressing or bathing? 0  Doing errands, shopping? 0  Preparing Food and eating ? N  Using the Toilet? N  In the past six months, have you accidently leaked urine? N  Do you have problems with loss of bowel control? N  Managing your Medications? N  Managing your Finances? N  Housekeeping or managing your Housekeeping? N    Patient Care Team: Corwin Antu, FNP as PCP - General (Family Medicine) Obie Princella HERO, MD (Inactive) as Consulting Physician (Gastroenterology) Addie Cordella Hamilton, MD as Consulting Physician (Orthopedic Surgery) Portia Fireman, OD (Optometry)  I have updated your Care Teams any recent Medical Services you may have received from other providers in  the past year.     Assessment:   This is a routine wellness examination for [place].  Hearing/Vision screen Hearing Screening - Comments:: Pt says her hearing is good Vision Screening - Comments:: Pt says her vision is good with glasses  Dr Portia w/UTD visits   Goals Addressed             This Visit's Progress    COMPLETED: Patient Stated       10/20/2020, I will continue to swim 2 days a week for 1 hour.      Patient Stated   On track    11/13/23-To lose more weight and exercise more        Depression Screen     11/13/2023    8:19 AM 10/14/2023    9:37 AM 11/07/2022    4:08 PM 07/11/2022    8:20 AM 04/10/2022   10:15 AM 10/30/2021    8:21 AM 10/20/2020    8:54 AM  PHQ 2/9 Scores  PHQ - 2 Score 0 0 0  0 0 0  PHQ- 9 Score  3   0  0  Exception Documentation    Patient refusal       Fall Risk     11/09/2023    1:26 PM 10/14/2023    9:38 AM 11/04/2022    2:51 PM 04/10/2022   10:15 AM 10/30/2021    8:21 AM  Fall Risk   Falls in the past year? 0 0 0 0 0  Number falls in past yr:  0   0  Injury with Fall?  0 0  0  Risk for fall due to : No Fall Risks No Fall Risks Impaired vision  No Fall Risks  Follow up Education provided;Falls prevention discussed Falls evaluation completed Falls prevention discussed Education provided  Falls evaluation completed      Data saved with a previous flowsheet row definition  MEDICARE RISK AT HOME:  Medicare Risk at Home Any stairs in or around the home?: (Patient-Rptd) No Home free of loose throw rugs in walkways, pet beds, electrical cords, etc?: (Patient-Rptd) Yes Adequate lighting in your home to reduce risk of falls?: (Patient-Rptd) Yes Life alert?: (Patient-Rptd) No Use of a cane, walker or w/c?: (Patient-Rptd) No Grab bars in the bathroom?: (Patient-Rptd) No Shower chair or bench in shower?: (Patient-Rptd) No Elevated toilet seat or a handicapped toilet?: (Patient-Rptd) Yes  TIMED UP AND GO:  Was the test performed?  Yes   Length of time to ambulate 10 feet: 15 sec Gait slow and steady with assistive device  Cognitive Function: 6CIT completed    10/20/2020    8:59 AM 09/24/2017    8:01 AM  MMSE - Mini Mental State Exam  Not completed: Refused   Orientation to time  5  Orientation to Place  5  Registration  3  Attention/ Calculation  5  Recall  3  Language- name 2 objects  2  Language- repeat  1  Language- follow 3 step command  3  Language- read & follow direction  1  Write a sentence  1  Copy design  1  Total score  30        11/13/2023    8:31 AM 11/07/2022    4:09 PM  6CIT Screen  What Year? 0 points 0 points  What month? 0 points 0 points  What time? 0 points 0 points  Count back from 20 0 points 0 points  Months in reverse 0 points 0 points  Repeat phrase 0 points 0 points  Total Score 0 points 0 points    Immunizations Immunization History  Administered Date(s) Administered   Fluad Quad(high Dose 65+) 12/22/2018, 12/28/2018, 12/18/2019, 12/29/2020, 01/01/2022   Influenza Split 12/19/2010, 12/18/2011   Influenza Whole 01/02/2007, 12/23/2007, 12/21/2008, 12/13/2009   Influenza,inj,Quad PF,6+ Mos 12/15/2012, 12/22/2013, 12/07/2014, 12/29/2015, 12/27/2016, 12/25/2017   PFIZER Comirnaty(Gray Top)Covid-19 Tri-Sucrose Vaccine 05/01/2019, 05/22/2019, 12/20/2019   Pfizer Covid-19 Vaccine Bivalent Booster 31yrs & up 01/31/2021   Pneumococcal Conjugate-13 09/13/2013   Pneumococcal Polysaccharide-23 07/08/2012   Td 09/03/2005   Tdap 09/18/2016   Zoster Recombinant(Shingrix) 04/19/2021, 08/09/2021   Zoster, Live 05/01/2009    Screening Tests Health Maintenance  Topic Date Due   DEXA SCAN  Never done   COVID-19 Vaccine (5 - 2024-25 season) 11/24/2022   INFLUENZA VACCINE  10/24/2023   MAMMOGRAM  05/05/2024   Medicare Annual Wellness (AWV)  11/12/2024   Colonoscopy  12/22/2024   DTaP/Tdap/Td (3 - Td or Tdap) 09/19/2026   Pneumococcal Vaccine: 50+ Years  Completed   Hepatitis C  Screening  Completed   Zoster Vaccines- Shingrix  Completed   HPV VACCINES  Aged Out   Meningococcal B Vaccine  Aged Out    Health Maintenance  Health Maintenance Due  Topic Date Due   DEXA SCAN  Never done   COVID-19 Vaccine (5 - 2024-25 season) 11/24/2022   INFLUENZA VACCINE  10/24/2023   Health Maintenance Items Addressed: Pt will get Covid later MMG and Dexa Scan ordered already for Fen 2026   Additional Screening:  Vision Screening: Recommended annual ophthalmology exams for early detection of glaucoma and other disorders of the eye. Would you like a referral to an eye doctor? No    Dental Screening: Recommended annual dental exams for proper oral hygiene  Community Resource Referral / Chronic Care Management: CRR required this visit?  No   CCM required this  visit?  Appt scheduled with PCP   Plan:    I have personally reviewed and noted the following in the patient's chart:   Medical and social history Use of alcohol, tobacco or illicit drugs  Current medications and supplements including opioid prescriptions. Patient is not currently taking opioid prescriptions. Functional ability and status Nutritional status Physical activity Advanced directives List of other physicians Hospitalizations, surgeries, and ER visits in previous 12 months Vitals Screenings to include cognitive, depression, and falls Referrals and appointments  In addition, I have reviewed and discussed with patient certain preventive protocols, quality metrics, and best practice recommendations. A written personalized care plan for preventive services as well as general preventive health recommendations were provided to patient.   Erminio LITTIE Saris, LPN   1/[id number]/7974   After Visit Summary: (MyChart) Due to this being a telephonic visit, the after visit summary with patients personalized plan was offered to patient via MyChart   Notes: Nothing significant to report at this time.

## 2023-11-13 NOTE — Patient Instructions (Addendum)
 Ms. Oddo , Thank you for taking time out of your busy schedule to complete your Annual Wellness Visit with me. I enjoyed our conversation and look forward to speaking with you again next year. I, as well as your care team,  appreciate your ongoing commitment to your health goals. Please review the following plan we discussed and let me know if I can assist you in the future. Your Game plan/ To Do List    Referrals: If you haven't heard from the office you've been referred to, please reach out to them at the phone provided.   Follow up Visits: We will see or speak with you next year for your Next Medicare AWV with our clinical staff-12/22/2024 @ 8:10am in person Have you seen your provider in the last 6 months (3 months if uncontrolled diabetes)? Yes  Clinician Recommendations:  Aim for 30 minutes of exercise or brisk walking, 6-8 glasses of water, and 5 servings of fruits and vegetables each day.       This is a list of the screenings recommended for you:  Health Maintenance  Topic Date Due   DEXA scan (bone density measurement)  Never done   COVID-19 Vaccine (5 - 2024-25 season) 11/24/2022   Flu Shot  10/24/2023   Mammogram  05/05/2024   Medicare Annual Wellness Visit  11/12/2024   Colon Cancer Screening  12/22/2024   DTaP/Tdap/Td vaccine (3 - Td or Tdap) 09/19/2026   Pneumococcal Vaccine for age over 33  Completed   Hepatitis C Screening  Completed   Zoster (Shingles) Vaccine  Completed   HPV Vaccine  Aged Out   Meningitis B Vaccine  Aged Out    Advanced directives: (Copy Requested) Please bring a copy of your health care power of attorney and living will to the office to be added to your chart at your convenience. You can mail to [facility] [address] [place], [place] [address] or email to [email or web address] Advance Care Planning is important because it:  [x]  Makes sure you receive the medical care that is consistent with your values, goals, and  preferences  [x]  It provides guidance to your family and loved ones and reduces their decisional burden about whether or not they are making the right decisions based on your wishes.  Follow the link provided in your after visit summary or read over the paperwork we have mailed to you to help you started getting your Advance Directives in place. If you need assistance in completing these, please reach out to us  so that we can help you!

## 2023-11-14 ENCOUNTER — Ambulatory Visit

## 2023-11-14 VITALS — BP 118/72 | HR 130 | Ht 62.0 in | Wt 274.6 lb

## 2023-11-14 DIAGNOSIS — I10 Essential (primary) hypertension: Secondary | ICD-10-CM

## 2023-11-14 DIAGNOSIS — I4891 Unspecified atrial fibrillation: Secondary | ICD-10-CM | POA: Diagnosis not present

## 2023-11-14 DIAGNOSIS — E782 Mixed hyperlipidemia: Secondary | ICD-10-CM | POA: Diagnosis not present

## 2023-11-14 MED ORDER — METOPROLOL TARTRATE 25 MG PO TABS: 25.0000 mg | ORAL_TABLET | Freq: Two times a day (BID) | ORAL | 3 refills | Status: AC

## 2023-11-14 NOTE — Patient Instructions (Addendum)
 Medication Instructions:  Your physician recommends the following medication changes.  START TAKING: Metoprolol  Tartrate 25 mg by mouth twice a day   *If you need a refill on your cardiac medications before your next appointment, please call your pharmacy*  Lab Work: Your provider would like for you to have following labs drawn today CBC, BMP, TSH.     Testing/Procedures: Your physician has requested that you have an echocardiogram. Echocardiography is a painless test that uses sound waves to create images of your heart. It provides your doctor with information about the size and shape of your heart and how well your heart's chambers and valves are working.   You may receive an ultrasound enhancing agent through an IV if needed to better visualize your heart during the echo. This procedure takes approximately one hour.  There are no restrictions for this procedure.  This will take place at [address] [facility] [address], [place] [address]  Please note: We ask at that you not bring children with you during ultrasound (echo/ vascular) testing. Due to room size and safety concerns, children are not allowed in the ultrasound rooms during exams. Our front office staff cannot provide observation of children in our lobby area while testing is being conducted. An adult accompanying a patient to their appointment will only be allowed in the ultrasound room at the discretion of the ultrasound technician under special circumstances. We apologize for any inconvenience.       Dear [name]  You are scheduled for a Cardioversion on Thursday, August 28 with Dr. Gollan.  Please arrive at the [facility] Entrance of [facility], [address], [place] [address] at 6:30 AM (This is 1 hour(s) prior to your procedure time).  Proceed to the Check-In Desk directly inside the entrance.  Procedure Parking: Use the entrance off of the [address] side of the hospital.  Turn right upon entering and follow the driveway to parking that is directly in front of the Heart & Vascular Center. There is no valet parking available at this entrance, however there is an awning directly in front of the Heart & Vascular Center for drop off/ pick up for patients.    DIET:  Nothing to eat or drink after midnight except a sip of water with medications (see medication instructions below)  Hold Hydrochlorothiazide  the morning of the procedure    Continue taking your anticoagulant (blood thinner): Apixaban  (Eliquis ).  You will need to continue this after your procedure until you are told by your provider that it is safe to stop.    LABS: Today CBC, BMP, TSH  FYI:  For your safety, and to allow us  to monitor your vital signs accurately during the surgery/procedure we request: If you have artificial nails, gel coating, SNS etc, please have those removed prior to your surgery/procedure. Not having the nail coverings /polish removed may result in cancellation or delay of your surgery/procedure.  Your support person will be asked to wait in the waiting room during your procedure.  It is OK to have someone drop you off and come back when you are ready to be discharged.  You cannot drive after the procedure and will need someone to drive you home.  Bring your insurance cards.  *Special Note: Every effort is made to have your procedure done on time. Occasionally there are emergencies that occur at the hospital that may cause delays. Please be patient if a delay does occur.  Follow-Up: At [facility], you and your health needs are our priority.  As part of our continuing mission to provide you with exceptional heart care, our providers are all part of one team.  This team includes your primary Cardiologist (physician) and Advanced Practice Providers or APPs (Physician Assistants and Nurse Practitioners) who all work together to provide you with the care you need, when you  need it.  Your next appointment:   2 week(s) after Cardioversion on 11/20/23  Provider:   Caron Poser, MD

## 2023-11-15 ENCOUNTER — Other Ambulatory Visit: Payer: Self-pay

## 2023-11-15 ENCOUNTER — Telehealth: Payer: Self-pay

## 2023-11-15 ENCOUNTER — Ambulatory Visit: Payer: Self-pay

## 2023-11-15 ENCOUNTER — Emergency Department
Admission: EM | Admit: 2023-11-15 | Discharge: 2023-11-15 | Disposition: A | Discharge status: Home / Self Care | Attending: Emergency Medicine | Admitting: Emergency Medicine

## 2023-11-15 DIAGNOSIS — I4819 Other persistent atrial fibrillation: Secondary | ICD-10-CM | POA: Diagnosis not present

## 2023-11-15 DIAGNOSIS — I4891 Unspecified atrial fibrillation: Secondary | ICD-10-CM | POA: Diagnosis not present

## 2023-11-15 DIAGNOSIS — R899 Unspecified abnormal finding in specimens from other organs, systems and tissues: Secondary | ICD-10-CM

## 2023-11-15 DIAGNOSIS — R7989 Other specified abnormal findings of blood chemistry: Secondary | ICD-10-CM | POA: Insufficient documentation

## 2023-11-15 DIAGNOSIS — Z7901 Long term (current) use of anticoagulants: Secondary | ICD-10-CM | POA: Diagnosis not present

## 2023-11-15 HISTORY — DX: Unspecified atrial fibrillation: I48.91

## 2023-11-15 LAB — BASIC METABOLIC PANEL WITH GFR
BUN/Creatinine Ratio: 21 (ref 12–28)
BUN: 23 mg/dL (ref 8–27)
CO2: 23 mmol/L (ref 20–29)
Calcium: 10.5 mg/dL — ABNORMAL HIGH (ref 8.7–10.3)
Chloride: 98 mmol/L (ref 96–106)
Creatinine, Ser: 1.12 mg/dL — ABNORMAL HIGH (ref 0.57–1.00)
Glucose: 115 mg/dL — ABNORMAL HIGH (ref 70–99)
Potassium: 3.6 mmol/L (ref 3.5–5.2)
Sodium: 140 mmol/L (ref 134–144)
eGFR: 51 mL/min/1.73 — ABNORMAL LOW (ref >59)

## 2023-11-15 LAB — TSH: TSH: 0.016 u[IU]/mL — ABNORMAL LOW (ref 0.450–4.500)

## 2023-11-15 LAB — COMPREHENSIVE METABOLIC PANEL WITH GFR
ALT: 17 U/L (ref 0–44)
AST: 27 U/L (ref 15–41)
Albumin: 3.6 g/dL (ref 3.5–5.0)
Alkaline Phosphatase: 46 U/L (ref 38–126)
Anion gap: 8 (ref 5–15)
BUN: 25 mg/dL — ABNORMAL HIGH (ref 8–23)
CO2: 32 mmol/L (ref 22–32)
Calcium: 9.6 mg/dL (ref 8.9–10.3)
Chloride: 99 mmol/L (ref 98–111)
Creatinine, Ser: 1.09 mg/dL — ABNORMAL HIGH (ref 0.44–1.00)
GFR, Estimated: 52 mL/min — ABNORMAL LOW (ref >60)
Glucose, Bld: 128 mg/dL — ABNORMAL HIGH (ref 70–99)
Potassium: 3 mmol/L — ABNORMAL LOW (ref 3.5–5.1)
Sodium: 139 mmol/L (ref 135–145)
Total Bilirubin: 1.8 mg/dL — ABNORMAL HIGH (ref 0.0–1.2)
Total Protein: 6.2 g/dL — ABNORMAL LOW (ref 6.5–8.1)

## 2023-11-15 LAB — CBC
HCT: 44.9 % (ref 36.0–46.0)
Hematocrit: 48.8 % — ABNORMAL HIGH (ref 34.0–46.6)
Hemoglobin: 15.7 g/dL (ref 11.1–15.9)
Hemoglobin: 15 g/dL (ref 12.0–15.0)
MCH: 27.9 pg (ref 26.6–33.0)
MCH: 27.6 pg (ref 26.0–34.0)
MCHC: 32.2 g/dL (ref 31.5–35.7)
MCHC: 33.4 g/dL (ref 30.0–36.0)
MCV: 87 fL (ref 79–97)
MCV: 82.5 fL (ref 80.0–100.0)
Platelets: 199 x10E3/uL (ref 150–450)
Platelets: 201 K/uL (ref 150–400)
RBC: 5.63 x10E6/uL — ABNORMAL HIGH (ref 3.77–5.28)
RBC: 5.44 MIL/uL — ABNORMAL HIGH (ref 3.87–5.11)
RDW: 13.1 % (ref 11.7–15.4)
RDW: 12.8 % (ref 11.5–15.5)
WBC: 7.2 x10E3/uL (ref 3.4–10.8)
WBC: 9.6 K/uL (ref 4.0–10.5)
nRBC: 0 % (ref 0.0–0.2)

## 2023-11-15 LAB — T4, FREE: Free T4: 1.67 ng/dL — ABNORMAL HIGH (ref 0.61–1.12)

## 2023-11-15 LAB — TROPONIN I (HIGH SENSITIVITY): Troponin I (High Sensitivity): 7 ng/L (ref <18)

## 2023-11-15 NOTE — ED Triage Notes (Signed)
 Pt to ED with daughter for abnormal lab. Saw cardiologist yesterday, was called today and told that blood was acidic. Pt has appt on Thursday for cardioversion for a fib. Pt takes blood thinner, name unknown. Took this AM. Pt denies pain. Ambulatory. Skin dry, respirations unlabored.

## 2023-11-15 NOTE — Discharge Instructions (Addendum)
 Your lab work was overall reassuring today, your anion gap was normal, no concerns for acidosis at this time.  Your thyroid  levels are abnormal, please discuss with your PCP

## 2023-11-15 NOTE — ED Provider Notes (Signed)
 [facility] Provider Note    Event Date/Time   First MD Initiated Contact with Patient 11/15/23 1113     (approximate)   History   Abnormal Lab   HPI  [name] is a 78 y.o. female with relatively recent diagnosis of atrial fibrillation who had initial visit with cardiology yesterday.  Is already on Eliquis , cardiology started metoprolol  twice daily.  Had labs done yesterday, referred to the emergency department for evaluation of elevated anion gap.  Patient feels well and has no complaints.     Physical Exam   Triage Vital Signs: ED Triage Vitals  Encounter Vitals Group     BP 11/15/23 1112 114/76     Girls Systolic BP Percentile --      Girls Diastolic BP Percentile --      Boys Systolic BP Percentile --      Boys Diastolic BP Percentile --      Pulse Rate 11/15/23 1112 (!) 104     Resp 11/15/23 1112 16     Temp 11/15/23 1112 98 F (36.7 C)     Temp Source 11/15/23 1112 Oral     SpO2 11/15/23 1112 96 %     Weight 11/15/23 1110 124.3 kg (274 lb)     Height 11/15/23 1110 1.575 m (5' 2)     Head Circumference --      Peak Flow --      Pain Score 11/15/23 1112 0     Pain Loc --      Pain Education --      Exclude from Growth Chart --     Most recent vital signs: Vitals:   11/15/23 1119 11/15/23 1246  BP:  100/73  Pulse:  100  Resp:  18  Temp:    SpO2: 99% 100%     General: Awake, no distress.  CV:  Good peripheral perfusion.  Irregular rhythm, heart rate 90s to 100 Resp:  Normal effort.  Abd:  No distention.  Other:     ED Results / Procedures / Treatments   Labs (all labs ordered are listed, but only abnormal results are displayed) Labs Reviewed  CBC - Abnormal; Notable for the following components:      Result Value   RBC 5.44 (*)    All other components within normal limits  COMPREHENSIVE METABOLIC PANEL WITH GFR - Abnormal; Notable for the following components:   Potassium 3.0 (*)    Glucose, Bld 128  (*)    BUN 25 (*)    Creatinine, Ser 1.09 (*)    Total Protein 6.2 (*)    Total Bilirubin 1.8 (*)    GFR, Estimated 52 (*)    All other components within normal limits  T4, FREE - Abnormal; Notable for the following components:   Free T4 1.67 (*)    All other components within normal limits  LACTIC ACID, PLASMA  LACTIC ACID, PLASMA  TROPONIN I (HIGH SENSITIVITY)  TROPONIN I (HIGH SENSITIVITY)     EKG  ED ECG REPORT I, Lamar Price, the attending physician, personally viewed and interpreted this ECG.  Date: 11/15/2023  Rhythm: Atrial fibrillation QRS Axis: normal Intervals: Abnormal ST/T Wave abnormalities: normal Narrative Interpretation: no evidence of acute ischemia    RADIOLOGY     PROCEDURES:  Critical Care performed:   Procedures   MEDICATIONS ORDERED IN ED: Medications - No data to display   IMPRESSION / MDM / ASSESSMENT AND PLAN / ED COURSE  I reviewed the triage vital signs and the nursing notes. Patient's presentation is most consistent with acute illness / injury with system symptoms.  Medical records reviewed, Dr. Argentina referred to ED for elevated anion gap per telephone note  Patient well-appearing here and is asymptomatic, will recheck labs  Chemistry is returned, anion gap is normal, high sensitive troponin is normal, T4 is elevated, TSH is low, have discussed with the patient need for outpatient follow-up with PCP  Discussed with [facility]. Raford of [facility], agrees with discharge home with outpatient follow-up with cardiology        FINAL CLINICAL IMPRESSION(S) / ED DIAGNOSES   Final diagnoses:  Persistent atrial fibrillation (HCC)  Abnormal laboratory test result     Rx / DC Orders   ED Discharge Orders     None        Note:  This document was prepared using Dragon voice recognition software and may include unintentional dictation errors.   Arlander Charleston, MD 11/15/23 1302

## 2023-11-15 NOTE — Telephone Encounter (Addendum)
 Called patient to discuss lab results.  Discussed that I am concerned about her anion gap of 19. Her CO2 is usually in the 30s, so I think she may be in a compensated metabolic acidosis (for now). Given that she has been in rapid afib for over a month now, I would want to make sure she is not in an early low-output state and developing an elevated lactic acid. Given these lab findings, I don't think we can safely manage this as an outpatient any longer.  Recommendations: -Advised she proceed to the ED for further investigation of her anion gap metabolic acidosis and consideration of inpatient treatment of her rapid atrial fibrillation -Her TSH was low, so a free T4 should be checked to make sure that hyperthyroidism is not a significant trigger of her afib  Patient expressed understanding and agreed to proceed to the ED later today.  Caron Poser, MD

## 2023-11-17 ENCOUNTER — Other Ambulatory Visit: Payer: Self-pay | Admitting: Family

## 2023-11-17 ENCOUNTER — Telehealth: Payer: Self-pay | Admitting: Family

## 2023-11-17 ENCOUNTER — Other Ambulatory Visit: Payer: Self-pay

## 2023-11-17 DIAGNOSIS — I4819 Other persistent atrial fibrillation: Secondary | ICD-10-CM

## 2023-11-17 DIAGNOSIS — I4891 Unspecified atrial fibrillation: Secondary | ICD-10-CM

## 2023-11-17 DIAGNOSIS — R7989 Other specified abnormal findings of blood chemistry: Secondary | ICD-10-CM

## 2023-11-17 NOTE — Telephone Encounter (Signed)
 Could you please call and let pt know that her cardiologist reached out to me for her abn thyroid  levels. I am referring her to endo for eval/ as suspected hyperthyroid (overactive thyroid ) that is likely contributing to heart symptoms.   Please let us  know if you have not heard back within 1 week about the referral.

## 2023-11-17 NOTE — Telephone Encounter (Signed)
 Spoke with pt and she is aware of this information.

## 2023-11-20 ENCOUNTER — Ambulatory Visit: Admitting: Anesthesiology

## 2023-11-20 ENCOUNTER — Encounter: Admission: RE | Disposition: A | Discharge status: Home / Self Care | Payer: Self-pay | Source: Home / Self Care

## 2023-11-20 ENCOUNTER — Other Ambulatory Visit: Payer: Self-pay

## 2023-11-20 ENCOUNTER — Ambulatory Visit: Admission: RE | Admit: 2023-11-20 | Discharge: 2023-11-20 | Disposition: A | Discharge status: Home / Self Care

## 2023-11-20 DIAGNOSIS — I129 Hypertensive chronic kidney disease with stage 1 through stage 4 chronic kidney disease, or unspecified chronic kidney disease: Secondary | ICD-10-CM | POA: Diagnosis not present

## 2023-11-20 DIAGNOSIS — I4819 Other persistent atrial fibrillation: Secondary | ICD-10-CM | POA: Diagnosis not present

## 2023-11-20 DIAGNOSIS — I4891 Unspecified atrial fibrillation: Secondary | ICD-10-CM

## 2023-11-20 DIAGNOSIS — N1831 Chronic kidney disease, stage 3a: Secondary | ICD-10-CM | POA: Diagnosis not present

## 2023-11-20 HISTORY — PX: CARDIOVERSION: SHX1299

## 2023-11-20 SURGERY — CARDIOVERSION
Anesthesia: General

## 2023-11-20 MED ORDER — SODIUM CHLORIDE 0.9 % IV SOLN: INTRAVENOUS | Status: DC

## 2023-11-20 MED ORDER — PROPOFOL 10 MG/ML IV BOLUS
INTRAVENOUS | Status: DC | PRN
  Administered 2023-11-20: 70 mg via INTRAVENOUS

## 2023-11-20 NOTE — Transfer of Care (Signed)
 Immediate Anesthesia Transfer of Care Note  Patient: [name]  Procedure(s) Performed: CARDIOVERSION  Patient Location: spu  Anesthesia Type:General  Level of Consciousness: awake, alert , and oriented  Airway & Oxygen Therapy: Patient Spontanous Breathing and Patient connected to nasal cannula oxygen  Post-op Assessment: Report given to RN and Post -op Vital signs reviewed and stable  Post vital signs: Reviewed  Last Vitals:  Vitals Value Taken Time  BP 91/61 11/20/23 07:42  Temp    Pulse 63 11/20/23 07:45  Resp 20 11/20/23 07:45  SpO2 93 % 11/20/23 07:45    Last Pain:  Vitals:   11/20/23 0658  TempSrc: Oral  PainSc: 0-No pain         Complications: No notable events documented.

## 2023-11-20 NOTE — Progress Notes (Signed)
 Dr. Argentina in at bedside to speak with pt. Post cardioversion. MD also spoke with pt. Son Franky re: procedure. Both verbalized understanding of conversation .

## 2023-11-20 NOTE — Anesthesia Postprocedure Evaluation (Signed)
 Anesthesia Post Note  Patient: [name]  Procedure(s) Performed: CARDIOVERSION  Patient location during evaluation: PACU Anesthesia Type: General Level of consciousness: awake and alert, oriented and patient cooperative Pain management: pain level controlled Vital Signs Assessment: post-procedure vital signs reviewed and stable Respiratory status: spontaneous breathing, nonlabored ventilation and respiratory function stable Cardiovascular status: blood pressure returned to baseline and stable Postop Assessment: adequate PO intake Anesthetic complications: no   No notable events documented.   Last Vitals:  Vitals:   11/20/23 0800 11/20/23 0815  BP: (!) 116/91 100/83  Pulse: (!) 57 63  Resp: 19 16  Temp:    SpO2: 93% 93%    Last Pain:  Vitals:   11/20/23 0815  TempSrc:   PainSc: 0-No pain                 Alfonso Ruths

## 2023-11-20 NOTE — CV Procedure (Signed)
   DIRECT CURRENT CARDIOVERSION  NAME:  [name]    MRN: [id number] DOB:  01-08-1946    ADMIT DATE: 11/20/2023  INDICATIONS: Symptomatic atrial fibrillation  PROCEDURE:   Informed consent was obtained prior to the procedure. The risks, benefits and alternatives for the procedure were discussed and the patient comprehended these risks.  COMPLICATIONS:    Complications: No complications Patient tolerated procedure well.  CARDIOVERSION:     Indications:  Symptomatic Atrial Fibrillation  Procedure Details:  Once an appropriate level of sedation was confirmed, the patient was cardioverted x 1 with 200J of biphasic synchronized energy.  The patient converted to NSR.  There were no apparent complications.  The patient had normal neuro status and respiratory status post procedure with vitals stable as recorded elsewhere.  Adequate airway was maintained throughout and vital signs monitored per protocol.  Signed, Caron Poser, MD

## 2023-11-20 NOTE — Anesthesia Preprocedure Evaluation (Addendum)
 Anesthesia Evaluation  Patient identified by MRN, date of birth, ID band Patient awake    Reviewed: Allergy & Precautions, NPO status , Patient's Chart, lab work & pertinent test results  History of Anesthesia Complications (+) PONV and history of anesthetic complications  Airway Mallampati: IV   Neck ROM: Full    Dental  (+) Missing   Pulmonary neg pulmonary ROS   Pulmonary exam normal breath sounds clear to auscultation       Cardiovascular hypertension, + dysrhythmias (a fib on Eliquis )  Rhythm:Irregular Rate:Tachycardia     Neuro/Psych negative neurological ROS     GI/Hepatic negative GI ROS,,,  Endo/Other    Class 4 obesityPrediabetes   Renal/GU Renal disease (stage III CKD)     Musculoskeletal  (+) Arthritis ,    Abdominal   Peds  Hematology negative hematology ROS (+)   Anesthesia Other Findings   Reproductive/Obstetrics                              Anesthesia Physical Anesthesia Plan  ASA: 3  Anesthesia Plan: General   Post-op Pain Management:    Induction: Intravenous  PONV Risk Score and Plan: 4 or greater and Propofol  infusion, TIVA and Treatment may vary due to age or medical condition  Airway Management Planned: Natural Airway  Additional Equipment:   Intra-op Plan:   Post-operative Plan:   Informed Consent: I have reviewed the patients History and Physical, chart, labs and discussed the procedure including the risks, benefits and alternatives for the proposed anesthesia with the patient or authorized representative who has indicated his/her understanding and acceptance.       Plan Discussed with: CRNA  Anesthesia Plan Comments: (LMA/GETA backup discussed.  Patient consented for risks of anesthesia including but not limited to:  - adverse reactions to medications - damage to eyes, teeth, lips or other oral mucosa - nerve damage due to positioning  -  sore throat or hoarseness - damage to heart, brain, nerves, lungs, other parts of body or loss of life  Informed patient about role of CRNA in peri- and intra-operative care.  Patient voiced understanding.)         Anesthesia Quick Evaluation

## 2023-11-20 NOTE — Interval H&P Note (Signed)
 History and Physical Interval Note:   11/20/2023 7:07 AM  [name]  has presented today for surgery, with the diagnosis of Cardioversion   Afib.  The various methods of treatment have been discussed with the patient and family. After consideration of risks, benefits and other options for treatment, the patient has consented to  Procedure(s): CARDIOVERSION (N/A) as a surgical intervention.  The patient's history has been reviewed, patient examined, no change in status, stable for surgery.  I have reviewed the patient's chart and labs.  Questions were answered to the patient's satisfaction.     Caron [facility]

## 2023-11-22 ENCOUNTER — Other Ambulatory Visit: Payer: Self-pay | Admitting: Family

## 2023-11-26 ENCOUNTER — Ambulatory Visit (INDEPENDENT_AMBULATORY_CARE_PROVIDER_SITE_OTHER): Admitting: Internal Medicine

## 2023-11-26 ENCOUNTER — Encounter: Payer: Self-pay | Admitting: Internal Medicine

## 2023-11-26 VITALS — BP 128/80 | HR 70 | Ht 62.0 in | Wt 278.0 lb

## 2023-11-26 DIAGNOSIS — E0590 Thyrotoxicosis, unspecified without thyrotoxic crisis or storm: Secondary | ICD-10-CM | POA: Diagnosis not present

## 2023-11-26 DIAGNOSIS — E0500 Thyrotoxicosis with diffuse goiter without thyrotoxic crisis or storm: Secondary | ICD-10-CM

## 2023-11-26 DIAGNOSIS — E010 Iodine-deficiency related diffuse (endemic) goiter: Secondary | ICD-10-CM | POA: Insufficient documentation

## 2023-11-26 HISTORY — DX: Thyrotoxicosis with diffuse goiter without thyrotoxic crisis or storm: E05.00

## 2023-11-26 NOTE — Patient Instructions (Signed)
 Once your thyroid  results are back, and if they continue to be elevated I will prescribe methimazole .  The dose will depend on your thyroid  levels

## 2023-11-26 NOTE — Progress Notes (Unsigned)
 Name: [name]  MRN/ DOB: [id number], [birth year]    Age/ Sex: 78 y.o., female    PCP: Corwin Antu, FNP   Reason for Endocrinology Evaluation: Hyperthyroidism     Date of Initial Endocrinology Evaluation: 11/26/2023     HPI: [name] is a 78 y.o. female with a past medical history of A-fib, HTN, dyslipidemia, CKD. The patient presented for initial endocrinology clinic visit on 11/26/2023 for consultative assistance with her Hyperthyroidism.   Patient has been noted with low TSH of 0.016 u IU/ML on August/22/2025 with elevated free T4 at 1.67 NG/DL.  Patient has established care with cardiology for A-fib, which she was diagnosed with in July, 2025 at her PCPs office.  She is s/p cardioversion on 11/20/2023 She is not on amiodarone   Patient has been noted weight loss over the past year No palpitations since cardioversion  No local neck swelling  NO tremors  No biotin  No constipation, has rare occasional diarrhea  No anxiety or depression   No FH of thyroid  disease        HISTORY:  Past Medical History:  Past Medical History:  Diagnosis Date   Arthritis    both knees (07/07/2012)   Atrial fibrillation (HCC)    Hypertension    on meds   PONV (postoperative nausea and vomiting)    Past Surgical History:  Past Surgical History:  Procedure Laterality Date   CARDIOVERSION N/A 11/20/2023   Procedure: CARDIOVERSION;  Surgeon: Argentina Clap, MD;  Location: [facility] ORS;  Service: Cardiovascular;  Laterality: N/A;   COLONOSCOPY N/A 08/27/2013   Procedure: COLONOSCOPY;  Surgeon: Princella CHRISTELLA Nida, MD;  Location: [facility] ENDOSCOPY;  Service: Endoscopy;  Laterality: N/A;   JOINT REPLACEMENT     TOTAL KNEE ARTHROPLASTY Right 07/07/2012   TOTAL KNEE ARTHROPLASTY Right 07/07/2012   Procedure: TOTAL KNEE ARTHROPLASTY;  Surgeon: Cordella Glendia Hutchinson, MD;  Location: [facility] OR;  Service: Orthopedics;  Laterality: Right;  Right Total Knee Arthroplasty   TOTAL KNEE  ARTHROPLASTY Left 10/13/2012   Procedure: TOTAL KNEE ARTHROPLASTY;  Surgeon: Cordella Glendia Hutchinson, MD;  Location: [facility] OR;  Service: Orthopedics;  Laterality: Left;   VAGINAL DELIVERY     X3    Social History:  reports that she has never smoked. She has never used smokeless tobacco. She reports that she does not drink alcohol and does not use drugs. Family History: family history includes Cancer in her mother; Colon cancer (age of onset: 37) in her father; Colon cancer (age of onset: 42) in her mother; Colon polyps (age of onset: 76) in her father; Colon polyps (age of onset: 37) in her mother; Heart attack in her maternal grandfather and son.   HOME MEDICATIONS: Allergies as of 11/26/2023   No Known Allergies      Medication List        Accurate as of November 26, 2023 12:49 PM. If you have any questions, ask your nurse or doctor.          apixaban  5 MG Tabs tablet Commonly known as: Eliquis  Take 1 tablet (5 mg total) by mouth 2 (two) times daily.   atorvastatin  10 MG tablet Commonly known as: LIPITOR Take 1 tablet (10 mg total) by mouth daily.   Calcium  Carbonate-Vitamin D  500-5 MG-MCG Tabs Take 1 tablet by mouth daily.   Fish Oil 1200 MG Caps Take 1,200 mg by mouth daily.   hydrochlorothiazide  25 MG tablet Commonly known as: HYDRODIURIL  TAKE 1 TABLET (  25 MG TOTAL) BY MOUTH DAILY.   metoprolol  tartrate 25 MG tablet Commonly known as: LOPRESSOR  Take 1 tablet (25 mg total) by mouth 2 (two) times daily.   ramipril  5 MG capsule Commonly known as: ALTACE  TAKE 1 CAPSULE BY MOUTH EVERY DAY          REVIEW OF SYSTEMS: A comprehensive ROS was conducted with the patient and is negative except as per HPI     OBJECTIVE:  VS: BP 128/80 (BP Location: Left Arm, Patient Position: Sitting, Cuff Size: Normal)   Pulse 70   Ht 5' 2 (1.575 m)   Wt 278 lb (126.1 kg)   SpO2 92%   BMI 50.85 kg/m    Wt Readings from Last 3 Encounters:  11/26/23 278 lb (126.1 kg)  11/20/23  273 lb (123.8 kg)  11/15/23 274 lb (124.3 kg)     EXAM: General: Pt appears well and is in NAD  Eyes: External eye exam normal without stare, lid lag or exophthalmos.  EOM intact.  PERRL.  Neck: General: Supple without adenopathy. Thyroid : Left thyroid  asymmetry noted  Lungs: Clear with good BS bilat   Heart: Auscultation: RRR.  Extremities:  BL LE: No pretibial edema   Mental Status: Judgment, insight: Intact Orientation: Oriented to time, place, and person Mood and affect: No depression, anxiety, or agitation     DATA REVIEWED: ***    Latest Reference Range & Units 11/15/23 11:23  Sodium 135 - 145 mmol/L 139  Potassium 3.5 - 5.1 mmol/L 3.0 (L)  Chloride 98 - 111 mmol/L 99  CO2 22 - 32 mmol/L 32  Glucose 70 - 99 mg/dL 871 (H)  BUN 8 - 23 mg/dL 25 (H)  Creatinine 9.55 - 1.00 mg/dL 8.90 (H)  Calcium  8.9 - 10.3 mg/dL 9.6  Anion gap 5 - 15  8  Alkaline Phosphatase 38 - 126 U/L 46  Albumin 3.5 - 5.0 g/dL 3.6  AST 15 - 41 U/L 27  ALT 0 - 44 U/L 17  Total Protein 6.5 - 8.1 g/dL 6.2 (L)  Total Bilirubin 0.0 - 1.2 mg/dL 1.8 (H)  GFR, Estimated >60 mL/min 52 (L)      Latest Reference Range & Units 11/15/23 11:23  WBC 4.0 - 10.5 K/uL 9.6  RBC 3.87 - 5.11 MIL/uL 5.44 (H)  Hemoglobin 12.0 - 15.0 g/dL 84.9  HCT 63.9 - 53.9 % 44.9  MCV 80.0 - 100.0 fL 82.5  MCH 26.0 - 34.0 pg 27.6  MCHC 30.0 - 36.0 g/dL 66.5  RDW 88.4 - 84.4 % 12.8  Platelets 150 - 400 K/uL 201  nRBC 0.0 - 0.2 % 0.0  (H): Data is abnormally high   ASSESSMENT/PLAN/RECOMMENDATIONS:   Hyperthyroidism   -Patient is clinically euthyroid - No local neck symptoms - Differential diagnosis includes Graves' disease, toxic thyroid  nodule, subacute thyroiditis -We discussed that Graves' Disease is a result of an autoimmune condition involving the thyroid .    We discussed with pt the benefits of methimazole  in the Tx of hyperthyroidism, as well as the possible side effects/complications of anti-thyroid  drug Tx  (specifically detailing the rare, but serious side effect of agranulocytosis). She was informed of need for regular thyroid  function monitoring while on methimazole  to ensure appropriate dosage without over-treatment. As well, we discussed the possible side effects of methimazole  including the chance of rash, the small chance of liver irritation/juandice and the <=1 in 300-400 chance of sudden onset agranulocytosis.  We discussed importance of going to ED promptly (and stopping  methimazole ) if shewere to develop significant fever with severe sore throat of other evidence of acute infection.     We extensively discussed the various treatment options for hyperthyroidism and Graves disease including ablation therapy with radioactive iodine versus antithyroid drug treatment versus surgical therapy.  We recommended to the patient that we felt, at this time, that thionamide therapy would be most optimal.  We discussed the various possible benefits versus side effects of the various therapies.   I carefully explained to the patient that one of the consequences of I-131 ablation treatment would likely be permanent hypothyroidism which would require long-term replacement therapy with LT4.   Medications :  Methimazole    2.  Thyromegaly  -Suspect left thyroid  nodule - Thyroid  ultrasound has been placed   Follow-up in 4 months Repeat labs in 2 months  Signed electronically by: Stefano Redgie Butts, MD  [facility] Endocrinology  [facility] [address] [place] [place], [place] [address] Phone: [phone] FAX: [phone]   CC: Corwin Antu, FNP [address] [place] [address] Phone: [phone] Fax: [phone]   Return to Endocrinology clinic as below: Future Appointments  Date Time Provider Department Center  12/08/2023  9:00 AM Argentina Clap, MD [facility] None  12/29/2023  9:30 AM [facility] US  2 [facility] None  01/30/2024  8:00 AM [facility] ENDO/NEURO LAB  [facility] None  03/30/2024  8:50 AM Adriel Kessen, Donell Redgie, MD [facility] None  04/14/2024  8:40 AM Corwin Antu, FNP [facility] [address]  12/22/2024  8:10 AM [facility] ANNUAL WELLNESS VISIT 1 [facility] [address]

## 2023-11-27 ENCOUNTER — Ambulatory Visit: Payer: Self-pay | Admitting: Internal Medicine

## 2023-11-27 DIAGNOSIS — E0590 Thyrotoxicosis, unspecified without thyrotoxic crisis or storm: Secondary | ICD-10-CM

## 2023-11-27 MED ORDER — METHIMAZOLE 5 MG PO TABS: 10.0000 mg | ORAL_TABLET | Freq: Every day | ORAL | 2 refills | Status: DC

## 2023-11-27 NOTE — Telephone Encounter (Signed)
 Please let the patient know that her thyroid  remains overactive   A prescription for methimazole  5 mg, 2 tablets a day has been sent to the pharmacy    The patient may choose to take 2 tablets together or if she wants to separate them by taking 1 in the morning 1 in the evening that is fine as well   She already has a lab appointment scheduled in 2 months   Thanks

## 2023-11-28 ENCOUNTER — Ambulatory Visit
Admission: RE | Admit: 2023-11-28 | Discharge: 2023-11-28 | Disposition: A | Discharge status: Home / Self Care | Source: Ambulatory Visit | Attending: Internal Medicine | Admitting: Internal Medicine

## 2023-11-28 ENCOUNTER — Other Ambulatory Visit: Payer: Self-pay | Admitting: Internal Medicine

## 2023-11-28 DIAGNOSIS — E0590 Thyrotoxicosis, unspecified without thyrotoxic crisis or storm: Secondary | ICD-10-CM

## 2023-11-28 DIAGNOSIS — E042 Nontoxic multinodular goiter: Secondary | ICD-10-CM | POA: Diagnosis not present

## 2023-11-28 LAB — TSH: TSH: 0.02 m[IU]/L — ABNORMAL LOW (ref 0.40–4.50)

## 2023-11-28 LAB — THYROID STIMULATING IMMUNOGLOBULIN: TSI: 239 %{baseline} — ABNORMAL HIGH (ref <140)

## 2023-11-28 LAB — T3, FREE: T3, Free: 4.8 pg/mL — ABNORMAL HIGH (ref 2.3–4.2)

## 2023-11-28 LAB — T4, FREE: Free T4: 1.6 ng/dL (ref 0.8–1.8)

## 2023-11-28 LAB — TRAB (TSH RECEPTOR BINDING ANTIBODY): TRAB: 5.65 IU/L — ABNORMAL HIGH (ref <=2.00)

## 2023-12-03 NOTE — Telephone Encounter (Signed)
 Spoke to the patient on 12/03/2023 at 1500    Discussed multinodular goiter on thyroid  ultrasound.  We discussed a large left thyroid  nodule meeting FNA criteria    I have recommended proceeding with thyroid  uptake and scan first  Patient advised to hold methimazole  intake 5 days prior to the procedure and resume after completing the procedure    Patient expressed understanding  Location was listed for [place] as that is closer to her    Abby Redgie Butts, MD  [facility] Endocrinology  [facility] [address] [place], [place] [address] Phone: [phone] FAX: [phone]

## 2023-12-08 ENCOUNTER — Ambulatory Visit

## 2023-12-08 VITALS — BP 113/79 | HR 102 | Ht 62.0 in | Wt 277.0 lb

## 2023-12-08 DIAGNOSIS — E0590 Thyrotoxicosis, unspecified without thyrotoxic crisis or storm: Secondary | ICD-10-CM | POA: Diagnosis not present

## 2023-12-08 DIAGNOSIS — E782 Mixed hyperlipidemia: Secondary | ICD-10-CM | POA: Insufficient documentation

## 2023-12-08 DIAGNOSIS — I10 Essential (primary) hypertension: Secondary | ICD-10-CM | POA: Insufficient documentation

## 2023-12-08 DIAGNOSIS — I480 Paroxysmal atrial fibrillation: Secondary | ICD-10-CM | POA: Insufficient documentation

## 2023-12-08 NOTE — Progress Notes (Signed)
 Cardiology Office Note   Date:  12/08/2023  ID:  [name], [name] 04/21/1945, MRN [id number] PCP: Corwin Antu, FNP  [facility] Providers Cardiologist:  Caron Poser, MD     History of Present Illness [name] is a 79 y.o. female PMH obesity, PAF on Eliquis , HLD, HTN, CKD 3A who presents for further evaluation and management of atrial fibrillation.  Patient was recently diagnosed with atrial fibrillation on 10/14/2023 at her PCPs office.  She was started on Eliquis  5 mg twice daily at that time.  She reports that she has not missed any doses.  She says that she is otherwise feeling okay other than some mild dyspnea with exertion.  She denies any LE edema, orthopnea, syncope/presyncope, or any other signs or symptoms of heart failure.  We discussed rate versus rhythm control at length today.  She is amenable to trialing a rhythm control strategy.  Interval history: Patient underwent successful DCCV with me on 11/20/2023.  She has unfortunately reverted back to atrial fibrillation, though at least rate controlled.  Her thyroid  testing showed mild hyperthyroidism, so she was referred to endocrinology and started on methimazole .  She reports they found some nodules and are undergoing further testing.  Relevant CVD History -DCCV 11/20/2023 -New atrial fibrillation diagnosed on ECG 10/14/2023   ROS: Pt denies any chest discomfort, jaw pain, arm pain, palpitations, syncope, presyncope, orthopnea, PND, or LE edema.  Studies Reviewed I have independently reviewed the patient's ECG and recent bloodwork.  Physical Exam VS:  BP 113/79 (BP Location: Left Wrist, Patient Position: Sitting, Cuff Size: Normal)   Pulse (!) 102   Ht 5' 2 (1.575 m)   Wt 277 lb (125.6 kg)   SpO2 98%   BMI 50.66 kg/m        Wt Readings from Last 3 Encounters:  12/08/23 277 lb (125.6 kg)  11/26/23 278 lb (126.1 kg)  11/20/23 273 lb (123.8 kg)    GEN: No acute distress. NECK: No  JVD; No carotid bruits. CARDIAC: Irregular rate/rhythm, no murmurs, rubs, gallops. RESPIRATORY:  Clear to auscultation. EXTREMITIES:  Warm and well-perfused. No edema.  ASSESSMENT AND PLAN Paroxysmal atrial fibrillation Hyperthyroidism CHA2DS2-VASc of at least 4.  She is now status post DCCV 11/20/2023.  Thyroid  labs were suggestive of hyperthyroidism, so she was referred to endocrinology who started her on methimazole .  This could be a possible trigger of her atrial fibrillation.  Body habitus and possible OSA are likely also contributory.  Unfortunately, her cardioversion only stuck for a week or 2.  We discussed rate versus rhythm control at length today including referral to EP for consideration of PVI ablation and antiarrhythmic drug initiation.  She says that she would prefer to have her thyroid  disease investigated and treated before trying any further attempts at rhythm control.  Plan: - Continue Eliquis  5 mg twice daily - Continue metoprolol  tartrate 25 mg BID.  Goal heart rate less than 110.  She is checking heart rates intermittently at home.  We can uptitrate if needed. - Echocardiogram pending - As above, patient would like to opt for a rate control only strategy for now while her thyroid  testing and treatment is underway.  We will have her come back in 3 months to assess progress.  If she is still in atrial fibrillation after optimization of her thyroid  disease, then we will send her to EP for consideration of PVI ablation and antiarrhythmic drug therapy.  While that referral is underway, we will likely  repeat DCCV once thyroid  disease is controlled.  HTN Well-controlled.  Continue ramipril  5 mg daily and HCTZ 25 mg daily  HLD Well-controlled, last LDL 57 10/2022.  Continue Lipitor 10 mg daily        Dispo: RTC 3 months or sooner as needed  Signed, Caron Poser, MD

## 2023-12-08 NOTE — Patient Instructions (Signed)
 Medication Instructions:  Your physician recommends that you continue on your current medications as directed. Please refer to the Current Medication list given to you today.  *If you need a refill on your cardiac medications before your next appointment, please call your pharmacy*  Lab Work: No labs ordered today  If you have labs (blood work) drawn today and your tests are completely normal, you will receive your results only by: MyChart Message (if you have MyChart) OR A paper copy in the mail If you have any lab test that is abnormal or we need to change your treatment, we will call you to review the results.  Testing/Procedures: No test ordered today   Follow-Up: At [facility], you and your health needs are our priority.  As part of our continuing mission to provide you with exceptional heart care, our providers are all part of one team.  This team includes your primary Cardiologist (physician) and Advanced Practice Providers or APPs (Physician Assistants and Nurse Practitioners) who all work together to provide you with the care you need, when you need it.  Your next appointment:   3 month(s)  Provider:   You may see Caron Poser, MD   We recommend signing up for the patient portal called MyChart.  Sign up information is provided on this After Visit Summary.  MyChart is used to connect with patients for Virtual Visits (Telemedicine).  Patients are able to view lab/test results, encounter notes, upcoming appointments, etc.  Non-urgent messages can be sent to your provider as well.   To learn more about what you can do with MyChart, go to [email or web address].

## 2023-12-15 ENCOUNTER — Encounter (HOSPITAL_COMMUNITY)
Admission: RE | Admit: 2023-12-15 | Discharge: 2023-12-15 | Disposition: A | Discharge status: Home / Self Care | Source: Ambulatory Visit | Attending: Internal Medicine | Admitting: Internal Medicine

## 2023-12-15 DIAGNOSIS — E0590 Thyrotoxicosis, unspecified without thyrotoxic crisis or storm: Secondary | ICD-10-CM | POA: Insufficient documentation

## 2023-12-15 MED ORDER — SODIUM IODIDE I-123 7.4 MBQ CAPS
433.0000 | ORAL_CAPSULE | Freq: Once | ORAL | Status: AC
  Administered 2023-12-15: 433 via ORAL

## 2023-12-16 ENCOUNTER — Encounter (HOSPITAL_COMMUNITY)
Admission: RE | Admit: 2023-12-16 | Discharge: 2023-12-16 | Disposition: A | Discharge status: Home / Self Care | Source: Ambulatory Visit | Attending: Internal Medicine | Admitting: Internal Medicine

## 2023-12-16 DIAGNOSIS — E042 Nontoxic multinodular goiter: Secondary | ICD-10-CM | POA: Diagnosis not present

## 2023-12-16 MED ORDER — SODIUM IODIDE I-123 7.4 MBQ CAPS
433.0000 | ORAL_CAPSULE | Freq: Once | ORAL | Status: AC
  Administered 2023-12-15: 433 via ORAL

## 2023-12-19 ENCOUNTER — Ambulatory Visit: Payer: Self-pay | Admitting: Internal Medicine

## 2023-12-19 DIAGNOSIS — E042 Nontoxic multinodular goiter: Secondary | ICD-10-CM

## 2023-12-19 NOTE — Telephone Encounter (Signed)
 Spoke to the pt on 12/19/2023 at 1415   Discussed uptake and scan results with left thyroid  nodule   Will need to proceed with FNA  Process was explained to the pt , all questions were answered      Abby Redgie Butts, MD  [facility] Endocrinology  [facility] [address] [place], [place] [address] Phone: [phone] FAX: [phone]

## 2023-12-25 ENCOUNTER — Inpatient Hospital Stay: Admission: RE | Admit: 2023-12-25 | Discharge: 2023-12-25 | Attending: Internal Medicine | Admitting: Internal Medicine

## 2023-12-25 ENCOUNTER — Other Ambulatory Visit (HOSPITAL_COMMUNITY)
Admission: RE | Admit: 2023-12-25 | Discharge: 2023-12-25 | Disposition: A | Discharge status: Home / Self Care | Source: Ambulatory Visit | Attending: Internal Medicine | Admitting: Internal Medicine

## 2023-12-25 DIAGNOSIS — E042 Nontoxic multinodular goiter: Secondary | ICD-10-CM

## 2023-12-25 DIAGNOSIS — E079 Disorder of thyroid, unspecified: Secondary | ICD-10-CM | POA: Diagnosis not present

## 2023-12-25 DIAGNOSIS — E041 Nontoxic single thyroid nodule: Secondary | ICD-10-CM | POA: Insufficient documentation

## 2023-12-26 LAB — CYTOLOGY - NON PAP

## 2023-12-29 ENCOUNTER — Ambulatory Visit: Payer: Self-pay | Admitting: Internal Medicine

## 2023-12-29 ENCOUNTER — Other Ambulatory Visit

## 2023-12-29 DIAGNOSIS — Z23 Encounter for immunization: Secondary | ICD-10-CM | POA: Diagnosis not present

## 2024-01-06 ENCOUNTER — Other Ambulatory Visit: Payer: Self-pay | Admitting: Family

## 2024-01-06 DIAGNOSIS — I499 Cardiac arrhythmia, unspecified: Secondary | ICD-10-CM

## 2024-01-06 DIAGNOSIS — I10 Essential (primary) hypertension: Secondary | ICD-10-CM

## 2024-01-06 DIAGNOSIS — Z6841 Body Mass Index (BMI) 40.0 and over, adult: Secondary | ICD-10-CM

## 2024-01-06 DIAGNOSIS — I4891 Unspecified atrial fibrillation: Secondary | ICD-10-CM

## 2024-01-09 ENCOUNTER — Encounter (HOSPITAL_COMMUNITY): Payer: Self-pay

## 2024-01-30 ENCOUNTER — Other Ambulatory Visit

## 2024-01-30 DIAGNOSIS — E0590 Thyrotoxicosis, unspecified without thyrotoxic crisis or storm: Secondary | ICD-10-CM | POA: Diagnosis not present

## 2024-01-31 LAB — TSH: TSH: 2.38 m[IU]/L (ref 0.40–4.50)

## 2024-01-31 LAB — T4, FREE: Free T4: 1.1 ng/dL (ref 0.8–1.8)

## 2024-02-02 ENCOUNTER — Ambulatory Visit: Payer: Self-pay | Admitting: Internal Medicine

## 2024-02-12 ENCOUNTER — Ambulatory Visit

## 2024-02-12 DIAGNOSIS — I4891 Unspecified atrial fibrillation: Secondary | ICD-10-CM | POA: Diagnosis not present

## 2024-02-12 LAB — ECHOCARDIOGRAM COMPLETE
AR max vel: 2.66 cm2
AV Area VTI: 2.9 cm2
AV Area mean vel: 2.48 cm2
AV Mean grad: 3.5 mmHg
AV Peak grad: 6.5 mmHg
Ao pk vel: 1.27 m/s
S' Lateral: 3.62 cm

## 2024-02-12 MED ORDER — PERFLUTREN LIPID MICROSPHERE
1.0000 mL | INTRAVENOUS | Status: AC | PRN
  Administered 2024-02-12: 4 mL via INTRAVENOUS

## 2024-03-12 NOTE — Progress Notes (Deleted)
 " Cardiology Office Note   Date:  03/12/2024  ID:  [name], [name] 11-14-1945, MRN [id number] PCP: Corwin Antu, FNP  [facility] Providers Cardiologist:  Caron Poser, MD     History of Present Illness [name] is a 78 y.o. female PMH obesity, PAF on Eliquis , HLD, HTN, CKD 3A who presents for further evaluation and management of atrial fibrillation.  Patient was recently diagnosed with atrial fibrillation on 10/14/2023 at her PCPs office.  She was started on Eliquis  5 mg twice daily at that time.  She reports that she has not missed any doses.  She says that she is otherwise feeling okay other than some mild dyspnea with exertion.  She denies any LE edema, orthopnea, syncope/presyncope, or any other signs or symptoms of heart failure.  We discussed rate versus rhythm control at length today.  She is amenable to trialing a rhythm control strategy.  Interval history: Since last visit, we obtained an echocardiogram which showed mildly reduced LV function.  Discussed further workup of her cardiomyopathy and referral to EP to which she politely declined.***  Relevant CVD History -TTE 02/12/2024 LVEF 45 to 50%, mildly reduced RV function, mild MR -DCCV 11/20/2023 -New atrial fibrillation diagnosed on ECG 10/14/2023   ROS: Pt denies any chest discomfort, jaw pain, arm pain, palpitations, syncope, presyncope, orthopnea, PND, or LE edema.  Studies Reviewed I have independently reviewed the patient's ECG and recent bloodwork.  Physical Exam VS:  There were no vitals taken for this visit.       Wt Readings from Last 3 Encounters:  12/08/23 277 lb (125.6 kg)  11/26/23 278 lb (126.1 kg)  11/20/23 273 lb (123.8 kg)    GEN: No acute distress. NECK: No JVD; No carotid bruits. CARDIAC: Irregular rate/rhythm, no murmurs, rubs, gallops. RESPIRATORY:  Clear to auscultation. EXTREMITIES:  Warm and well-perfused. No edema.  ASSESSMENT AND PLAN Paroxysmal  atrial fibrillation Hyperthyroidism CHA2DS2-VASc of at least 4.  She is now status post DCCV 11/20/2023.  Thyroid  labs were suggestive of hyperthyroidism, so she was referred to endocrinology who started her on methimazole .  This could be a possible trigger of her atrial fibrillation.  Body habitus and possible OSA are likely also contributory.  Unfortunately, her cardioversion only stuck for a week or 2.  We discussed rate versus rhythm control at length today including referral to EP for consideration of PVI ablation and antiarrhythmic drug initiation.  She says that she would prefer to have her thyroid  disease investigated and treated before trying any further attempts at rhythm control.  Plan: - Continue Eliquis  5 mg twice daily - Continue metoprolol  tartrate 25 mg BID.  Goal heart rate less than 110.  She is checking heart rates intermittently at home.  We can uptitrate if needed. - As above, patient would like to opt for a rate control only strategy for now while her thyroid  testing and treatment is underway.  We will have her come back in 3 months to assess progress.  If she is still in atrial fibrillation after optimization of her thyroid  disease, then we will send her to EP for consideration of PVI ablation and antiarrhythmic drug therapy.  While that referral is underway, we will likely repeat DCCV once thyroid  disease is controlled. ***  HFrEF LVEF 45 to 50% on echo from 01/2024.  NYHA I.  Unclear etiology.  Plan: - Continue metoprolol  XL 25 mg daily - Continue ramipril  5 mg daily - Recommended further cardiomyopathy workup for  ischemic/nonischemic causes; she politely declined for now  HTN Well-controlled.  Continue ramipril  5 mg daily and HCTZ 25 mg daily  HLD Well-controlled, last LDL 57 10/2022.  Continue Lipitor 10 mg daily        Dispo: RTC 3 months or sooner as needed  Signed, Caron Poser, MD  "

## 2024-03-15 ENCOUNTER — Ambulatory Visit

## 2024-03-18 NOTE — Progress Notes (Signed)
 " Cardiology Office Note   Date:  03/29/2024  ID:  Angelys, Yetman [birth year], MRN [id number] PCP: Corwin Antu, FNP  [facility] Providers Cardiologist:  Caron Poser, MD     History of Present Illness Jacqualyn Sedgwick is a 78 y.o. female PMH obesity, PAF on Eliquis , HLD, HTN, CKD 3A who presents for further evaluation and management of atrial fibrillation.  Patient was recently diagnosed with atrial fibrillation on 10/14/2023 at her PCPs office.  She was started on Eliquis  5 mg twice daily at that time.  She reports that she has not missed any doses.  She says that she is otherwise feeling okay other than some mild dyspnea with exertion.  She denies any LE edema, orthopnea, syncope/presyncope, or any other signs or symptoms of heart failure.  We discussed rate versus rhythm control at length today.  She is amenable to trialing a rhythm control strategy.  Interval history: Since last visit, we obtained an echocardiogram which showed mildly reduced LV function.  Discussed further workup of her cardiomyopathy and referral to EP to which she politely declined.  Today, she reports she is feeling well.  Her thyroid  testing has normalized.  I again recommended EP referral and further workup of her cardiomyopathy.  I also recommended a repeat DCCV now that her thyroid  function has normalized.  She politely declined and would like to reassess at next visit in 3 months.  Relevant CVD History -TTE 02/12/2024 LVEF 45 to 50%, mildly reduced RV function, mild MR -DCCV 11/20/2023 -New atrial fibrillation diagnosed on ECG 10/14/2023   ROS: Pt denies any chest discomfort, jaw pain, arm pain, palpitations, syncope, presyncope, orthopnea, PND, or LE edema.  Studies Reviewed I have independently reviewed the patient's ECG, previous cardiac testing, previous medical record and recent bloodwork.  Physical Exam VS:  BP 120/82 (BP Location: Left Arm, Patient Position: Sitting, Cuff  Size: Large)   Pulse 99   Ht 5' 2 (1.575 m)   Wt 283 lb (128.4 kg)   SpO2 97%   BMI 51.76 kg/m        Wt Readings from Last 3 Encounters:  03/29/24 283 lb (128.4 kg)  12/08/23 277 lb (125.6 kg)  11/26/23 278 lb (126.1 kg)    GEN: No acute distress. NECK: No JVD; No carotid bruits. CARDIAC: Irregular rate/rhythm, no murmurs, rubs, gallops. RESPIRATORY:  Clear to auscultation. EXTREMITIES:  Warm and well-perfused. No edema.  ASSESSMENT AND PLAN Persistent atrial fibrillation Hyperthyroidism CHA2DS2-VASc of at least 4.  She is now status post DCCV 11/20/2023.  Thyroid  labs were suggestive of hyperthyroidism, so she was referred to endocrinology who started her on methimazole .  This could be a possible trigger of her atrial fibrillation.  Body habitus and possible OSA are likely also contributory.  Unfortunately, her cardioversion only stuck for a week or 2.  We discussed rate versus rhythm control at length at our last 2 visits including referral to EP for consideration of PVI ablation and antiarrhythmic drug initiation.  She politely declined and would like to defer this decision until her next visit in approximately 3 months.  Plan: - Continue Eliquis  5 mg twice daily - Continue metoprolol  tartrate 25 mg BID - As above, patient would like to opt for a rate control only strategy for now. We will have her come back in 3 months to reassess.  If she is agreeable at that time, then we will refer her to EP for consideration of antiarrhythmic drug therapy and/or PVI  ablation.  We will plan for repeat DCCV as well now that her thyroid  disease is under control.  HFrEF LVEF 45 to 50% on echo from 01/2024.  NYHA I.  Undifferentiated, most likely due to afib.   Plan: - Continue metoprolol  XL 25 mg daily - Continue ramipril  5 mg daily - Recommended further cardiomyopathy workup for ischemic/nonischemic causes; she politely declined for now.  I think a stress PET would be best from a noninvasive  standpoint given her age/kidney disease/heart rate/body habitus  HTN Well-controlled.  Continue ramipril  5 mg daily and HCTZ 25 mg daily  HLD Well-controlled, last LDL 57 10/2022.  Continue Lipitor 10 mg daily        Dispo: RTC 3 months or sooner as needed  Signed, Caron Poser, MD  "

## 2024-03-29 ENCOUNTER — Ambulatory Visit

## 2024-03-29 VITALS — BP 120/82 | HR 99 | Ht 62.0 in | Wt 283.0 lb

## 2024-03-29 DIAGNOSIS — I4819 Other persistent atrial fibrillation: Secondary | ICD-10-CM | POA: Diagnosis not present

## 2024-03-29 DIAGNOSIS — E782 Mixed hyperlipidemia: Secondary | ICD-10-CM | POA: Insufficient documentation

## 2024-03-29 DIAGNOSIS — I5020 Unspecified systolic (congestive) heart failure: Secondary | ICD-10-CM | POA: Insufficient documentation

## 2024-03-29 DIAGNOSIS — I10 Essential (primary) hypertension: Secondary | ICD-10-CM | POA: Insufficient documentation

## 2024-03-29 NOTE — Patient Instructions (Signed)
 Medication Instructions:  Your physician recommends that you continue on your current medications as directed. Please refer to the Current Medication list given to you today.   *If you need a refill on your cardiac medications before your next appointment, please call your pharmacy*  Lab Work: None ordered at this time   Follow-Up: At [facility], you and your health needs are our priority.  As part of our continuing mission to provide you with exceptional heart care, our providers are all part of one team.  This team includes your primary Cardiologist (physician) and Advanced Practice Providers or APPs (Physician Assistants and Nurse Practitioners) who all work together to provide you with the care you need, when you need it.  Your next appointment:   3 month(s)  Provider:   You may see Caron Poser, MD

## 2024-03-30 ENCOUNTER — Ambulatory Visit: Admitting: Internal Medicine

## 2024-03-30 ENCOUNTER — Encounter: Payer: Self-pay | Admitting: Internal Medicine

## 2024-03-30 ENCOUNTER — Other Ambulatory Visit

## 2024-03-30 VITALS — BP 112/80 | HR 86 | Ht 62.0 in | Wt 284.0 lb

## 2024-03-30 DIAGNOSIS — E0500 Thyrotoxicosis with diffuse goiter without thyrotoxic crisis or storm: Secondary | ICD-10-CM | POA: Diagnosis not present

## 2024-03-30 DIAGNOSIS — E0590 Thyrotoxicosis, unspecified without thyrotoxic crisis or storm: Secondary | ICD-10-CM

## 2024-03-30 DIAGNOSIS — E042 Nontoxic multinodular goiter: Secondary | ICD-10-CM

## 2024-03-30 NOTE — Progress Notes (Unsigned)
 "   Name: [name]  MRN/ DOB: [id number], Jun 09, 1945    Age/ Sex: 79 y.o., female    PCP: Corwin Antu, FNP   Reason for Endocrinology Evaluation: Hyperthyroidism     Date of Initial Endocrinology Evaluation: 11/26/2023    HPI: [name] is a 79 y.o. female with a past medical history of A-fib, HTN, dyslipidemia, CKD. The patient presented for initial endocrinology clinic visit on 11/26/2023 for consultative assistance with her Hyperthyroidism.   Patient has been noted with low TSH of 0.016 u IU/ML on August/22/2025 with elevated free T4 at 1.67 NG/DL.  Patient has established care with cardiology for A-fib, which she was diagnosed with in July, 2025 at her PCPs office.  She is s/p cardioversion on 11/20/2023 She is not on amiodarone  No FH of thyroid  disease    I started the patient on methimazole  in September, 2025  TRAb was slightly elevated in September, 2025  Thyroid  ultrasound showed multinodular goiter with a left thyroid  nodule meeting FNA criteria in September, 2025 Thyroid  uptake and scan showed left cold nodule in September, 2025  FNA of the left 6.7 cm nodule showed atypia of undetermined significance, Afirma benign 12/2023   SUBJECTIVE:    Today (03/30/2024): [name] is here for follow-up on hyperthyroidism.  Patient follows with cardiology for persistent A-fib, on beta-blocker and Eliquis  as well asHFrEF  Patient has been no weight gain since her last visit here No local neck swelling  No dysphagia  Has occasional  palpitations No constipation or diarrhea  No tremors  No anxiety or jittery sensation   No eye symptoms  Methimazole  5 mg, BID   HISTORY:  Past Medical History:  Past Medical History:  Diagnosis Date   Arthritis    both knees (07/07/2012)   Atrial fibrillation (HCC)    Graves disease 11/26/2023   Hypertension    on meds   PONV (postoperative nausea and vomiting)    Past Surgical History:   Past Surgical History:  Procedure Laterality Date   CARDIOVERSION N/A 11/20/2023   Procedure: CARDIOVERSION;  Surgeon: Argentina Clap, MD;  Location: [facility] ORS;  Service: Cardiovascular;  Laterality: N/A;   COLONOSCOPY N/A 08/27/2013   Procedure: COLONOSCOPY;  Surgeon: Princella CHRISTELLA Nida, MD;  Location: [facility]COPY;  Service: Endoscopy;  Laterality: N/A;   JOINT REPLACEMENT     TOTAL KNEE ARTHROPLASTY Right 07/07/2012   TOTAL KNEE ARTHROPLASTY Right 07/07/2012   Procedure: TOTAL KNEE ARTHROPLASTY;  Surgeon: Cordella Glendia Hutchinson, MD;  Location: [facility] OR;  Service: Orthopedics;  Laterality: Right;  Right Total Knee Arthroplasty   TOTAL KNEE ARTHROPLASTY Left 10/13/2012   Procedure: TOTAL KNEE ARTHROPLASTY;  Surgeon: Cordella Glendia Hutchinson, MD;  Location: [facility] OR;  Service: Orthopedics;  Laterality: Left;   VAGINAL DELIVERY     X3    Social History:  reports that she has never smoked. She has never used smokeless tobacco. She reports that she does not drink alcohol and does not use drugs. Family History: family history includes Cancer in her mother; Colon cancer (age of onset: 73) in her father; Colon cancer (age of onset: 65) in her mother; Colon polyps (age of onset: 44) in her father; Colon polyps (age of onset: 39) in her mother; Heart attack in her maternal grandfather and son.   HOME MEDICATIONS: Allergies as of 03/30/2024   No Known Allergies      Medication List        Accurate as of March 30, 2024  8:44 AM. If you have any questions, ask your nurse or doctor.          atorvastatin  10 MG tablet Commonly known as: LIPITOR Take 1 tablet (10 mg total) by mouth daily.   Calcium  Carbonate-Vitamin D  500-5 MG-MCG Tabs Take 1 tablet by mouth daily.   Eliquis  5 MG Tabs tablet Generic drug: apixaban  TAKE 1 TABLET BY MOUTH TWICE A DAY   Fish Oil 1200 MG Caps Take 1,200 mg by mouth daily.   hydrochlorothiazide  25 MG tablet Commonly known as: HYDRODIURIL  TAKE 1 TABLET (25 MG TOTAL) BY MOUTH  DAILY.   methimazole  5 MG tablet Commonly known as: TAPAZOLE  Take 2 tablets (10 mg total) by mouth daily.   metoprolol  tartrate 25 MG tablet Commonly known as: LOPRESSOR  Take 1 tablet (25 mg total) by mouth 2 (two) times daily.   ramipril  5 MG capsule Commonly known as: ALTACE  TAKE 1 CAPSULE BY MOUTH EVERY DAY          REVIEW OF SYSTEMS: A comprehensive ROS was conducted with the patient and is negative except as per HPI     OBJECTIVE:  VS: BP 112/80   Pulse 86   Ht 5' 2 (1.575 m)   Wt 284 lb (128.8 kg)   SpO2 97%   BMI 51.94 kg/m    Wt Readings from Last 3 Encounters:  03/30/24 284 lb (128.8 kg)  03/29/24 283 lb (128.4 kg)  12/08/23 277 lb (125.6 kg)     EXAM: General: Pt appears well and is in NAD  Eyes: External eye exam normal without stare, lid lag or exophthalmos.  EOM intact.  PERRL.  Neck: General: Supple without adenopathy. Thyroid : Left thyroid  asymmetry noted  Lungs: Clear with good BS bilat   Heart: Auscultation: RRR.  Extremities:  BL LE: No pretibial edema   Mental Status: Judgment, insight: Intact Orientation: Oriented to time, place, and person Mood and affect: No depression, anxiety, or agitation     DATA REVIEWED:   Latest Reference Range & Units 03/30/24 09:13  TSH 0.40 - 4.50 mIU/L 13.19 (H)  Triiodothyronine,Free,Serum 2.3 - 4.2 pg/mL 3.5  T4,Free(Direct) 0.8 - 1.8 ng/dL 0.8     Latest Reference Range & Units 11/15/23 11:23  Sodium 135 - 145 mmol/L 139  Potassium 3.5 - 5.1 mmol/L 3.0 (L)  Chloride 98 - 111 mmol/L 99  CO2 22 - 32 mmol/L 32  Glucose 70 - 99 mg/dL 871 (H)  BUN 8 - 23 mg/dL 25 (H)  Creatinine 9.55 - 1.00 mg/dL 8.90 (H)  Calcium  8.9 - 10.3 mg/dL 9.6  Anion gap 5 - 15  8  Alkaline Phosphatase 38 - 126 U/L 46  Albumin 3.5 - 5.0 g/dL 3.6  AST 15 - 41 U/L 27  ALT 0 - 44 U/L 17  Total Protein 6.5 - 8.1 g/dL 6.2 (L)  Total Bilirubin 0.0 - 1.2 mg/dL 1.8 (H)  GFR, Estimated >60 mL/min 52 (L)      Latest  Reference Range & Units 11/15/23 11:23  WBC 4.0 - 10.5 K/uL 9.6  RBC 3.87 - 5.11 MIL/uL 5.44 (H)  Hemoglobin 12.0 - 15.0 g/dL 84.9  HCT 63.9 - 53.9 % 44.9  MCV 80.0 - 100.0 fL 82.5  MCH 26.0 - 34.0 pg 27.6  MCHC 30.0 - 36.0 g/dL 66.5  RDW 88.4 - 84.4 % 12.8  Platelets 150 - 400 K/uL 201  nRBC 0.0 - 0.2 % 0.0  (H): Data is abnormally high  Latest Reference Range & Units 11/26/23 12:41  TRAB <=2.00 IU/L 5.65 (H)    Thyroid  Ultrasound 11/28/2023  FINDINGS: Parenchymal Echotexture: Mildly heterogeneous   Isthmus: 0.8 cm   Right lobe: 4.5 x 1.7 x 2.0 cm   Left lobe: 7.6 x 4.3 x 5.1 cm   ________________________________________________________   Estimated total number of nodules >/= 1 cm: 2   Number of spongiform nodules >/=  2 cm not described below (TR1): 0   Number of mixed cystic and solid nodules >/= 1.5 cm not described below (TR2): 0   _________________________________________________________   Nodule # 1:   Location: Right; Superior   Maximum size: 0.9 cm; Other 2 dimensions: 0.7 x 0.7 cm   Composition: solid/almost completely solid (2)   Echogenicity: hypoechoic (2)   Shape: not taller-than-wide (0)   Margins: smooth (0)   Echogenic foci: none (0)   ACR TI-RADS total points: 4.   ACR TI-RADS risk category: TR4 (4-6 points).   ACR TI-RADS recommendations:   Given size (<1.0 cm) And appearance, this nodule does NOT meet TI-RADS criteria for biopsy or dedicated follow-up.   _________________________________________________________   Nodule # 2:   Location: Right; Mid   Maximum size: 0.7 cm; Other 2 dimensions: 0.7 x 0.5 cm   Composition: solid/almost completely solid (2)   Echogenicity: isoechoic (1)   Shape: not taller-than-wide (0)   Margins: ill-defined (0)   Echogenic foci: none (0)   ACR TI-RADS total points: 3.   ACR TI-RADS risk category: TR3 (3 points).   ACR TI-RADS recommendations:   Given size (<1.4 cm) and appearance,  this nodule does NOT meet TI-RADS criteria for biopsy or dedicated follow-up.   _________________________________________________________   Nodule # 3:   Location: Right; Inferior   Maximum size: 1.2 cm; Other 2 dimensions: 1.1 x 1.0 cm   Composition: solid/almost completely solid (2)   Echogenicity: isoechoic (1)   Shape: not taller-than-wide (0)   Margins: ill-defined (0)   Echogenic foci: none (0)   ACR TI-RADS total points: 3.   ACR TI-RADS risk category: TR3 (3 points).   ACR TI-RADS recommendations:   Given size (<1.4 cm) and appearance, this nodule does NOT meet TI-RADS criteria for biopsy or dedicated follow-up.   _________________________________________________________   Nodule # 4:   Location: Left; Mid   Maximum size: 6.7 cm; Other 2 dimensions: 4.9 x 3.5 cm   Composition: solid/almost completely solid (2)   Echogenicity: isoechoic (1)   Shape: not taller-than-wide (0)   Margins: ill-defined (0)   Echogenic foci: none (0)   ACR TI-RADS total points: 3.   ACR TI-RADS risk category: TR3 (3 points).   ACR TI-RADS recommendations:   **Given size (>/= 2.5 cm) and appearance, fine needle aspiration of this mildly suspicious nodule should be considered based on TI-RADS criteria.   _________________________________________________________   No cervical lymphadenopathy.   IMPRESSION: 1. Nodular replacement of nearly the entire left hemi thyroid  (labeled 4, 6.7 cm) which meets criteria (TI-RADS category 3) for tissue sampling. Recommend ultrasound-guided fine-needle aspiration. 2. There are a few scattered right-sided thyroid  nodules which appeared benign and do not warrant additional follow-up.   Thyroid  Uptake and Scan 12/15/2023  FINDINGS: Thyroid  gland is moderately enlarged. There is a large cold nodule in the LEFT lobe of the thyroid  gland.   4 hour I-123 uptake = 14.8% (normal 5-20%)   24 hour I-123 uptake = 44.6% (normal 10-30%)    IMPRESSION: Enlarged nodular thyroid  gland with elevated iodine  uptake. 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   Cold nodule in the LEFT lobe of the thyroid  gland. Recommend following biopsy recommendation of ultrasound report 11/28/2023.    FNA left thyroid  nodule 6.7 cm  12/25/2023    Clinical History: Nodule #4: Left; Mid, Maximum size: 6.7 cm; Other 2 dimensions: 4.9 x 3.5 cm, solid/almost completely solid, isoechoic, TI-RADS total points: 3. Specimen Submitted:  A. THYROID , LT MID, FINE NEEDLE ASPIRATION   FINAL MICROSCOPIC DIAGNOSIS: - Atypia of undetermined significance (Bethesda category III)   Afirma Benign   ASSESSMENT/PLAN/RECOMMENDATIONS:   Hyperthyroidism   -Patient has been noted with weight gain since her last visit here -No local neck symptoms - This is secondary to Graves' disease, the patient does have a cold left large thyroid  nodule on thyroid  uptake and scan - TFTs show elevated TSH, patient will be advised to hold methimazole  for 3 days then restart at 50% less methimazole  - Will recheck labs in 6 weeks  Medications :  Methimazole  5 mg, 1 tabs daily   2.  MNG:   -No local neck symptoms - Patient with a cold left 6.7 cm nodule on uptake and scan/ultrasound 12/2023 - FNA (Bethesda category III) Afirma was benign 12/2023 -We discussed definitive treatment to include hemithyroidectomy versus radiofrequency ablation    3.  Graves' disease:  -TRAb elevated - No extrathyroidal manifestations of Graves' disease - I did advise the patient she needs to notify her ophthalmologist regarding this diagnosis - We discussed S/S of Graves' orbitopathy    Follow-up in 4 months   Signed electronically by: Stefano Redgie Butts, MD  [facility] Endocrinology  [facility] [address] [place] [place], [place] [address] Phone: [phone] FAX: [phone]   CC: Corwin Antu, FNP [address] [place] [address] Phone: [phone] Fax: [phone]   Return to Endocrinology clinic as below: Future Appointments  Date Time Provider Department Center  03/30/2024  8:50 AM Ozell Juhasz, Donell Redgie, MD [facility] None  04/14/2024  8:40 AM Corwin Antu, FNP [facility] [address]  05/06/2024  9:00 AM [facility] MM 2 [facility]  12/22/2024  8:10 AM [facility] ANNUAL WELLNESS VISIT 1 [facility] [address]         "

## 2024-03-31 ENCOUNTER — Ambulatory Visit: Payer: Self-pay | Admitting: Internal Medicine

## 2024-03-31 LAB — T4, FREE: Free T4: 0.8 ng/dL (ref 0.8–1.8)

## 2024-03-31 LAB — T3, FREE: T3, Free: 3.5 pg/mL (ref 2.3–4.2)

## 2024-03-31 LAB — TSH: TSH: 13.19 m[IU]/L — ABNORMAL HIGH (ref 0.40–4.50)

## 2024-03-31 MED ORDER — METHIMAZOLE 5 MG PO TABS: 5.0000 mg | ORAL_TABLET | Freq: Every day | ORAL | 2 refills | Status: AC

## 2024-03-31 NOTE — Telephone Encounter (Signed)
 Please let the patient that the current dose of methimazole  is too much for her, please asked the patient to HOLD taking methimazole  for 3 days, then RESTART at just 1 tablet today    Please schedule her for a lab appointment in 6 weeks   Thank you

## 2024-03-31 NOTE — Telephone Encounter (Signed)
-----   Message from Donell Redgie Butts, MD sent at 03/31/2024  8:59 AM EST -----

## 2024-03-31 NOTE — Telephone Encounter (Signed)
 Patient aware of results.  Please clarify restart directions

## 2024-03-31 NOTE — Progress Notes (Signed)
 Patient aware

## 2024-04-02 ENCOUNTER — Other Ambulatory Visit: Payer: Self-pay | Admitting: Family

## 2024-04-02 DIAGNOSIS — I4891 Unspecified atrial fibrillation: Secondary | ICD-10-CM

## 2024-04-02 DIAGNOSIS — E66813 Obesity, class 3: Secondary | ICD-10-CM

## 2024-04-02 DIAGNOSIS — I499 Cardiac arrhythmia, unspecified: Secondary | ICD-10-CM

## 2024-04-02 DIAGNOSIS — I10 Essential (primary) hypertension: Secondary | ICD-10-CM

## 2024-04-14 ENCOUNTER — Encounter: Payer: Self-pay | Admitting: Family

## 2024-04-14 ENCOUNTER — Ambulatory Visit: Admitting: Family

## 2024-04-14 VITALS — BP 132/82 | HR 70 | Temp 98.3°F | Ht 62.25 in | Wt 282.8 lb

## 2024-04-14 DIAGNOSIS — Z780 Asymptomatic menopausal state: Secondary | ICD-10-CM

## 2024-04-14 DIAGNOSIS — N1831 Chronic kidney disease, stage 3a: Secondary | ICD-10-CM | POA: Diagnosis not present

## 2024-04-14 DIAGNOSIS — R809 Proteinuria, unspecified: Secondary | ICD-10-CM

## 2024-04-14 DIAGNOSIS — R7303 Prediabetes: Secondary | ICD-10-CM | POA: Diagnosis not present

## 2024-04-14 DIAGNOSIS — I4891 Unspecified atrial fibrillation: Secondary | ICD-10-CM

## 2024-04-14 DIAGNOSIS — I10 Essential (primary) hypertension: Secondary | ICD-10-CM

## 2024-04-14 DIAGNOSIS — E7800 Pure hypercholesterolemia, unspecified: Secondary | ICD-10-CM | POA: Diagnosis not present

## 2024-04-14 DIAGNOSIS — I129 Hypertensive chronic kidney disease with stage 1 through stage 4 chronic kidney disease, or unspecified chronic kidney disease: Secondary | ICD-10-CM | POA: Diagnosis not present

## 2024-04-14 DIAGNOSIS — E0500 Thyrotoxicosis with diffuse goiter without thyrotoxic crisis or storm: Secondary | ICD-10-CM | POA: Diagnosis not present

## 2024-04-14 DIAGNOSIS — Z6841 Body Mass Index (BMI) 40.0 and over, adult: Secondary | ICD-10-CM

## 2024-04-14 LAB — COMPREHENSIVE METABOLIC PANEL WITH GFR
ALT: 11 U/L (ref 3–35)
AST: 18 U/L (ref 5–37)
Albumin: 4.1 g/dL (ref 3.5–5.2)
Alkaline Phosphatase: 63 U/L (ref 39–117)
BUN: 20 mg/dL (ref 6–23)
CO2: 30 meq/L (ref 19–32)
Calcium: 10.2 mg/dL (ref 8.4–10.5)
Chloride: 101 meq/L (ref 96–112)
Creatinine, Ser: 1.14 mg/dL (ref 0.40–1.20)
GFR: 46.35 mL/min — ABNORMAL LOW
Glucose, Bld: 121 mg/dL — ABNORMAL HIGH (ref 70–99)
Potassium: 3.6 meq/L (ref 3.5–5.1)
Sodium: 141 meq/L (ref 135–145)
Total Bilirubin: 1.2 mg/dL (ref 0.2–1.2)
Total Protein: 7.2 g/dL (ref 6.0–8.3)

## 2024-04-14 LAB — LIPID PANEL
Cholesterol: 134 mg/dL (ref 28–200)
HDL: 46.8 mg/dL
LDL Cholesterol: 61 mg/dL (ref 10–99)
NonHDL: 87.26
Total CHOL/HDL Ratio: 3
Triglycerides: 133 mg/dL (ref 10.0–149.0)
VLDL: 26.6 mg/dL (ref 0.0–40.0)

## 2024-04-14 LAB — HEMOGLOBIN A1C: Hgb A1c MFr Bld: 6.1 % (ref 4.6–6.5)

## 2024-04-14 NOTE — Progress Notes (Signed)
 "  Subjective:  Patient ID: [name], female    DOB: 1945/05/01  Age: 79 y.o. MRN: [id number]  Patient Care Team: Corwin Antu, FNP as PCP - General (Family Medicine) Argentina Clap, MD as PCP - Cardiology (Cardiology) Obie Princella HERO, MD (Inactive) as Consulting Physician (Gastroenterology) Addie Cordella Hamilton, MD as Consulting Physician (Orthopedic Surgery) Portia Fireman, OD (Optometry)   CC:  Chief Complaint  Patient presents with   Annual Exam    HPI [name] is a 79 y.o. female who presents today for an annual physical exam. She reports consuming a general diet. The patient does not participate in regular exercise at present. She generally feels well. She reports sleeping well. She does not have additional problems to discuss today.   Vision:Within last year Dental:No regular dental care Has dentures   Mammogram: 05/06/23 Last pap: >65 Colonoscopy: 12/23/19 every five years  Bone density scan:  Pt is without acute concerns.   Discussed the use of AI scribe software for clinical note transcription with the patient, who gave verbal consent to proceed.  History of Present Illness [name] is a 79 year old female with persistent atrial fibrillation and hyperthyroidism who presents for an annual physical exam.  She is managing persistent atrial fibrillation and hyperthyroidism. She takes methimazole  twice daily, 30 minutes before other medications, and is scheduled for follow-up blood work in a few weeks to monitor her thyroid  levels.  She is on Eliquis  and metoprolol , both taken twice daily, for atrial fibrillation. A previous cardioversion was unsuccessful. Her hyperthyroidism is secondary to Graves' disease, with a cold left thyroid  nodule noted on a thyroid  update and scan.  She regularly takes hydrochlorothiazide , fish oils, cholesterol medications, calcium , vitamin D , and ramipril . No issues with her stomach or bowels and reports  good sleep quality. She experiences low energy levels, which she attributes to her atrial fibrillation and hyperthyroidism.  She has a history of four polyps and is scheduled for a colonoscopy this year. She denies any history of smoking and has false teeth due to past dental issues. She has an advanced directive but has not provided a copy to her current healthcare provider.   Advanced Directives Patient does have advanced directives      DEPRESSION SCREENING    04/14/2024    8:40 AM 11/13/2023    8:19 AM 10/14/2023    9:37 AM 11/07/2022    4:08 PM 07/11/2022    8:20 AM 04/10/2022   10:15 AM 10/30/2021    8:21 AM  PHQ 2/9 Scores  PHQ - 2 Score 0 0 0 0  0 0  PHQ- 9 Score 1  3    0    Exception Documentation     Patient refusal       Data saved with a previous flowsheet row definition     ROS: Negative unless specifically indicated above in HPI.   Current Medications[1]    Objective:    BP 132/82 (BP Location: Left Arm, Patient Position: Sitting, Cuff Size: Large)   Pulse 70   Temp 98.3 F (36.8 C) (Temporal)   Ht 5' 2.25 (1.581 m)   Wt 282 lb 12.8 oz (128.3 kg)   SpO2 97%   BMI 51.31 kg/m   BP Readings from Last 3 Encounters:  04/14/24 132/82  03/30/24 112/80  03/29/24 120/82      Physical Exam Vitals reviewed.  Constitutional:      General: She is not in acute distress.  Appearance: Normal appearance. She is obese. She is not ill-appearing.  HENT:     Head: Normocephalic.     Right Ear: Tympanic membrane normal.     Left Ear: Tympanic membrane normal.     Nose: Nose normal.     Mouth/Throat:     Mouth: Mucous membranes are moist.  Eyes:     Extraocular Movements: Extraocular movements intact.     Pupils: Pupils are equal, round, and reactive to light.  Cardiovascular:     Rate and Rhythm: Normal rate and regular rhythm.  Pulmonary:     Effort: Pulmonary effort is normal.     Breath sounds: Normal breath sounds.  Abdominal:     General: Abdomen is  flat. Bowel sounds are normal.     Palpations: Abdomen is soft.     Tenderness: There is no guarding or rebound.  Musculoskeletal:        General: Normal range of motion.     Cervical back: Normal range of motion.  Skin:    General: Skin is warm.     Capillary Refill: Capillary refill takes less than 2 seconds.  Neurological:     General: No focal deficit present.     Mental Status: She is alert.  Psychiatric:        Mood and Affect: Mood normal.        Behavior: Behavior normal.        Thought Content: Thought content normal.        Judgment: Judgment normal.       Results Labs TSH (03/30/2024): Elevated  Radiology Thyroid  scan: Cold left lobe thyroid  nodule  Diagnostic Colonoscopy: Four polyps      Assessment & Plan:   Assessment and Plan Assessment & Plan Annual physical examination Routine annual physical examination with no new issues reported. Up to date with dental exams and mammogram scheduled for February 12th. Bone density test pending scheduling. No changes in family history. Advanced directive or living will not on file. - Ordered regular labs including thyroid , kidney function, electrolytes, liver function, cholesterol, and A1c. - Advised to schedule bone density test at [facility] in [place]. - Encouraged bringing a copy of advanced directive or living will to the office.  Atrial fibrillation Persistent atrial fibrillation, possibly exacerbated by hyperthyroidism. Managed with Eliquis  and metoprolol . Previous cardioversion was unsuccessful. Reports low energy levels attributed to AFib and hyperthyroidism. - Continue Eliquis  5 mg oral bid. - Continue metoprolol  25 mg oral bid. - Follow up with cardiology in three months.  Graves' disease with hyperthyroidism and thyroid  nodule Graves' disease with secondary hyperthyroidism and thyroid  nodule. Managed with methimazole . Recent labs showed elevated TSH, advised to adjust methimazole  dosage.  Discussed potential for radioactive iodine treatment. Advised to notify ophthalmologist due to Graves' disease. - Hold methimazole  for three days, then restart at 50% dosage. - Will recheck labs in six weeks. - Notify ophthalmologist regarding Graves' disease.  Essential hypertension Hypertension managed with ramipril  and hydrochlorothiazide . - Continue ramipril  5 mg oral daily. - Continue hydrochlorothiazide  25 mg oral daily.  Pure hypercholesterolemia Hypercholesterolemia managed with atorvastatin . - Continue atorvastatin  10 mg oral daily.  Stage 3a chronic kidney disease Chronic kidney disease stage 3a. - Continue to monitor kidney function with regular labs.          Follow-up: Return in about 1 year (around 04/14/2025) for f/u CPE.   Ginger Patrick, FNP     [1]  Current Outpatient Medications:    atorvastatin  (LIPITOR) 10  MG tablet, Take 1 tablet (10 mg total) by mouth daily., Disp: 90 tablet, Rfl: 3   Calcium  Carbonate-Vitamin D  500-5 MG-MCG TABS, Take 1 tablet by mouth daily., Disp: , Rfl:    ELIQUIS  5 MG TABS tablet, TAKE 1 TABLET BY MOUTH TWICE A DAY, Disp: 60 tablet, Rfl: 2   hydrochlorothiazide  (HYDRODIURIL ) 25 MG tablet, TAKE 1 TABLET (25 MG TOTAL) BY MOUTH DAILY., Disp: 90 tablet, Rfl: 3   methimazole  (TAPAZOLE ) 5 MG tablet, Take 1 tablet (5 mg total) by mouth daily., Disp: 90 tablet, Rfl: 2   metoprolol  tartrate (LOPRESSOR ) 25 MG tablet, Take 1 tablet (25 mg total) by mouth 2 (two) times daily., Disp: 180 tablet, Rfl: 3   Omega-3 Fatty Acids (FISH OIL) 1200 MG CAPS, Take 1,200 mg by mouth daily., Disp: , Rfl:    ramipril  (ALTACE ) 5 MG capsule, TAKE 1 CAPSULE BY MOUTH EVERY DAY, Disp: 90 capsule, Rfl: 3  "

## 2024-04-14 NOTE — Patient Instructions (Signed)
  I have sent an electronic order over to your preferred location for the following:   []   2D Mammogram  [x]   3D Mammogram  []   Bone Density   Please give this center a call to get scheduled at your convenience.  [x]   [facility]  [address] [place] [address]  [phone]  Make sure to wear two piece  clothing  No lotions powders or deodorants the day of the appointment Make sure to bring picture ID and insurance card.  Bring list of medications you are currently taking including any supplements.   ------------------------------------

## 2024-04-14 NOTE — Addendum Note (Signed)
 Addended by: Arnez Stoneking E on: 04/14/2024 03:42 PM   Modules accepted: Orders

## 2024-04-15 ENCOUNTER — Other Ambulatory Visit: Payer: Self-pay | Admitting: Radiology

## 2024-04-15 ENCOUNTER — Ambulatory Visit: Payer: Self-pay | Admitting: Family

## 2024-04-15 DIAGNOSIS — I10 Essential (primary) hypertension: Secondary | ICD-10-CM

## 2024-04-16 LAB — MICROALBUMIN / CREATININE URINE RATIO
Creatinine,U: 136.9 mg/dL
Microalb Creat Ratio: 10.2 mg/g (ref 0.0–30.0)
Microalb, Ur: 1.4 mg/dL (ref 0.7–1.9)

## 2024-05-06 ENCOUNTER — Ambulatory Visit

## 2024-05-12 ENCOUNTER — Other Ambulatory Visit

## 2024-06-29 ENCOUNTER — Ambulatory Visit

## 2024-07-27 ENCOUNTER — Ambulatory Visit: Admitting: Internal Medicine

## 2024-11-16 ENCOUNTER — Ambulatory Visit

## 2024-12-22 ENCOUNTER — Ambulatory Visit
